# Patient Record
Sex: Female | Born: 1935 | Race: White | Hispanic: No | Smoking: Former smoker
Health system: Southern US, Community
[De-identification: ages and names within clinical notes are randomized; demographics above are authoritative.]

## PROBLEM LIST (undated history)

## (undated) DIAGNOSIS — Z950 Presence of cardiac pacemaker: Secondary | ICD-10-CM

## (undated) DIAGNOSIS — I495 Sick sinus syndrome: Secondary | ICD-10-CM

## (undated) DIAGNOSIS — F329 Major depressive disorder, single episode, unspecified: Secondary | ICD-10-CM

## (undated) DIAGNOSIS — M1990 Unspecified osteoarthritis, unspecified site: Secondary | ICD-10-CM

## (undated) DIAGNOSIS — F32A Depression, unspecified: Secondary | ICD-10-CM

## (undated) DIAGNOSIS — K449 Diaphragmatic hernia without obstruction or gangrene: Secondary | ICD-10-CM

## (undated) DIAGNOSIS — F419 Anxiety disorder, unspecified: Secondary | ICD-10-CM

## (undated) DIAGNOSIS — K219 Gastro-esophageal reflux disease without esophagitis: Secondary | ICD-10-CM

## (undated) DIAGNOSIS — K589 Irritable bowel syndrome without diarrhea: Secondary | ICD-10-CM

## (undated) DIAGNOSIS — Z8679 Personal history of other diseases of the circulatory system: Secondary | ICD-10-CM

## (undated) DIAGNOSIS — R413 Other amnesia: Secondary | ICD-10-CM

## (undated) DIAGNOSIS — M545 Low back pain, unspecified: Secondary | ICD-10-CM

## (undated) DIAGNOSIS — D472 Monoclonal gammopathy: Secondary | ICD-10-CM

## (undated) DIAGNOSIS — G309 Alzheimer's disease, unspecified: Secondary | ICD-10-CM

## (undated) DIAGNOSIS — E7800 Pure hypercholesterolemia, unspecified: Secondary | ICD-10-CM

## (undated) DIAGNOSIS — F0280 Dementia in other diseases classified elsewhere without behavioral disturbance: Secondary | ICD-10-CM

## (undated) DIAGNOSIS — D649 Anemia, unspecified: Secondary | ICD-10-CM

## (undated) DIAGNOSIS — K317 Polyp of stomach and duodenum: Secondary | ICD-10-CM

## (undated) DIAGNOSIS — K5730 Diverticulosis of large intestine without perforation or abscess without bleeding: Secondary | ICD-10-CM

## (undated) HISTORY — DX: Major depressive disorder, single episode, unspecified: F32.9

## (undated) HISTORY — DX: Low back pain: M54.5

## (undated) HISTORY — DX: Other amnesia: R41.3

## (undated) HISTORY — DX: Sick sinus syndrome: I49.5

## (undated) HISTORY — DX: Diaphragmatic hernia without obstruction or gangrene: K44.9

## (undated) HISTORY — DX: Anemia, unspecified: D64.9

## (undated) HISTORY — DX: Anxiety disorder, unspecified: F41.9

## (undated) HISTORY — DX: Pure hypercholesterolemia, unspecified: E78.00

## (undated) HISTORY — DX: Diverticulosis of large intestine without perforation or abscess without bleeding: K57.30

## (undated) HISTORY — DX: Alzheimer's disease, unspecified: G30.9

## (undated) HISTORY — DX: Monoclonal gammopathy: D47.2

## (undated) HISTORY — DX: Depression, unspecified: F32.A

## (undated) HISTORY — DX: Dementia in other diseases classified elsewhere without behavioral disturbance: F02.80

## (undated) HISTORY — DX: Gastro-esophageal reflux disease without esophagitis: K21.9

## (undated) HISTORY — DX: Presence of cardiac pacemaker: Z95.0

## (undated) HISTORY — DX: Irritable bowel syndrome, unspecified: K58.9

## (undated) HISTORY — DX: Unspecified osteoarthritis, unspecified site: M19.90

## (undated) HISTORY — DX: Low back pain, unspecified: M54.50

## (undated) HISTORY — DX: Polyp of stomach and duodenum: K31.7

## (undated) HISTORY — PX: PACEMAKER PLACEMENT: SHX43

## (undated) HISTORY — DX: Personal history of other diseases of the circulatory system: Z86.79

## (undated) HISTORY — PX: APPENDECTOMY: SHX54

---

## 1997-09-06 ENCOUNTER — Other Ambulatory Visit: Admission: RE | Admit: 1997-09-06 | Discharge: 1997-09-06 | Payer: Self-pay | Admitting: *Deleted

## 1998-10-08 ENCOUNTER — Other Ambulatory Visit: Admission: RE | Admit: 1998-10-08 | Discharge: 1998-10-08 | Payer: Self-pay | Admitting: *Deleted

## 1998-11-26 ENCOUNTER — Encounter (INDEPENDENT_AMBULATORY_CARE_PROVIDER_SITE_OTHER): Payer: Self-pay

## 1998-11-26 ENCOUNTER — Other Ambulatory Visit: Admission: RE | Admit: 1998-11-26 | Discharge: 1998-11-26 | Payer: Self-pay | Admitting: *Deleted

## 1999-02-19 ENCOUNTER — Encounter: Admission: RE | Admit: 1999-02-19 | Discharge: 1999-02-19 | Payer: Self-pay | Admitting: *Deleted

## 1999-12-03 ENCOUNTER — Other Ambulatory Visit: Admission: RE | Admit: 1999-12-03 | Discharge: 1999-12-03 | Payer: Self-pay | Admitting: *Deleted

## 2000-01-06 ENCOUNTER — Inpatient Hospital Stay (HOSPITAL_COMMUNITY): Admission: EM | Admit: 2000-01-06 | Discharge: 2000-01-07 | Payer: Self-pay | Admitting: Emergency Medicine

## 2000-01-06 ENCOUNTER — Encounter: Payer: Self-pay | Admitting: Cardiology

## 2000-03-15 ENCOUNTER — Encounter: Admission: RE | Admit: 2000-03-15 | Discharge: 2000-03-15 | Payer: Self-pay | Admitting: *Deleted

## 2000-03-15 ENCOUNTER — Encounter: Payer: Self-pay | Admitting: *Deleted

## 2001-03-30 ENCOUNTER — Encounter: Admission: RE | Admit: 2001-03-30 | Discharge: 2001-03-30 | Payer: Self-pay | Admitting: *Deleted

## 2001-03-30 ENCOUNTER — Encounter: Payer: Self-pay | Admitting: *Deleted

## 2001-04-04 ENCOUNTER — Encounter: Admission: RE | Admit: 2001-04-04 | Discharge: 2001-04-04 | Payer: Self-pay | Admitting: *Deleted

## 2001-04-04 ENCOUNTER — Encounter: Payer: Self-pay | Admitting: *Deleted

## 2001-05-02 ENCOUNTER — Encounter: Payer: Self-pay | Admitting: Internal Medicine

## 2002-06-02 ENCOUNTER — Encounter: Payer: Self-pay | Admitting: *Deleted

## 2002-06-02 ENCOUNTER — Encounter: Admission: RE | Admit: 2002-06-02 | Discharge: 2002-06-02 | Payer: Self-pay | Admitting: *Deleted

## 2003-06-26 ENCOUNTER — Ambulatory Visit (HOSPITAL_COMMUNITY): Admission: RE | Admit: 2003-06-26 | Discharge: 2003-06-26 | Payer: Self-pay | Admitting: Neurology

## 2003-06-29 ENCOUNTER — Encounter: Admission: RE | Admit: 2003-06-29 | Discharge: 2003-06-29 | Payer: Self-pay | Admitting: *Deleted

## 2003-07-06 ENCOUNTER — Encounter: Admission: RE | Admit: 2003-07-06 | Discharge: 2003-07-06 | Payer: Self-pay | Admitting: Neurology

## 2004-04-09 ENCOUNTER — Ambulatory Visit: Payer: Self-pay

## 2004-05-01 ENCOUNTER — Ambulatory Visit: Payer: Self-pay

## 2004-05-31 ENCOUNTER — Ambulatory Visit: Payer: Self-pay | Admitting: Cardiology

## 2004-06-26 ENCOUNTER — Ambulatory Visit: Payer: Self-pay | Admitting: Cardiology

## 2004-07-25 ENCOUNTER — Ambulatory Visit: Payer: Self-pay | Admitting: Cardiology

## 2004-08-25 ENCOUNTER — Ambulatory Visit: Payer: Self-pay | Admitting: Cardiology

## 2004-10-02 ENCOUNTER — Ambulatory Visit: Payer: Self-pay | Admitting: Cardiology

## 2004-10-02 ENCOUNTER — Other Ambulatory Visit: Admission: RE | Admit: 2004-10-02 | Discharge: 2004-10-02 | Payer: Self-pay | Admitting: *Deleted

## 2004-10-22 ENCOUNTER — Encounter: Admission: RE | Admit: 2004-10-22 | Discharge: 2004-10-22 | Payer: Self-pay | Admitting: *Deleted

## 2004-11-13 ENCOUNTER — Ambulatory Visit: Payer: Self-pay | Admitting: Cardiology

## 2004-12-15 ENCOUNTER — Ambulatory Visit: Payer: Self-pay | Admitting: Cardiology

## 2005-01-20 ENCOUNTER — Ambulatory Visit: Payer: Self-pay | Admitting: Cardiology

## 2005-01-21 ENCOUNTER — Ambulatory Visit: Payer: Self-pay | Admitting: Cardiology

## 2005-02-12 IMAGING — CT CT T SPINE W/ CM
3 of 15 series · 10 of 33 positions shown, 11 images · IV contrast (omnipaque)
Comparison: none

CLINICAL DATA: Intermittent bilateral lower extremity and right upper extremity pain and numbness.  No previous spinal surgery. 
 CERVICAL MYELOGRAM
 THORACIC MYELOGRAM
 LUMBAR MYELOGRAM
 CT CERVICAL SPINE WITH INTRATHECAL CONTRAST
 CT THORACIC SPINE WITH INTRATHECAL CONTRAST
 CT LUMBAR SPINE WITH INTRATHECAL CONTRAST
 CT MULTIPLANAR RECONSTRUCTIONS
TECHNIQUE: Lumbar region prepped with Betadine, draped in the usual sterile fashion, and infiltrated locally with buffered Lidocaine. Curved 22 gauge spinal needle advanced into the thecal sac at L3-4 from a left interlaminar approach. Once clear, colorless CSF returned, 10 ml Omnipaque 300 were administered intrathecally for cervical, thoracic, and lumbar myelography, followed by axial CT scanning of the cervical, thoracic, and lumbar spine.  No immediate complication. Sagittal and coronal reconstructions were generated from the axial CT scans.

[Series 8: recon 3: t spine · axial · 0.33mm/px · z∈[-299,+3]mm · 2 of 243 slices shown, 3 images]
[im 1/243  soft-tissue]
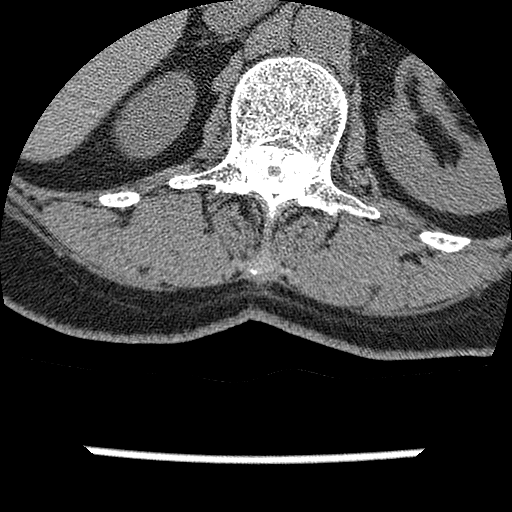
[im 1/243  bone]
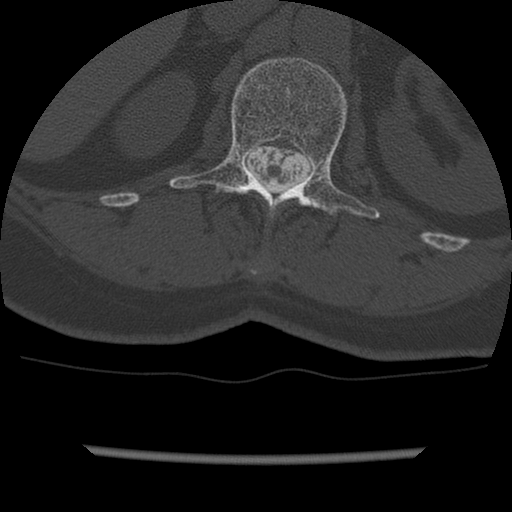
[im 243/243  bone]
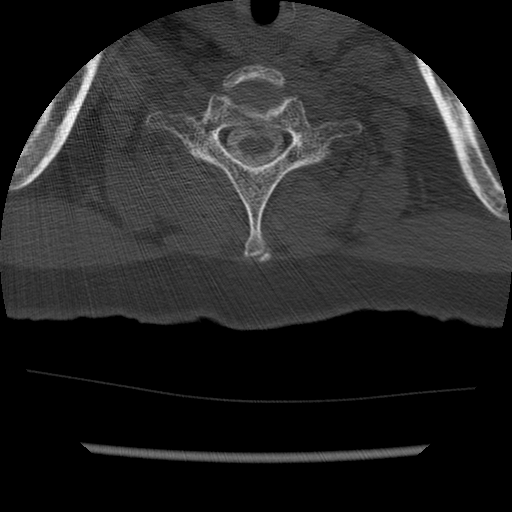

[Series 963: reformatted · sagittal · 0.27mm/px · 5 of 40 slices shown (1 of 2)]
[im 7/40  bone]
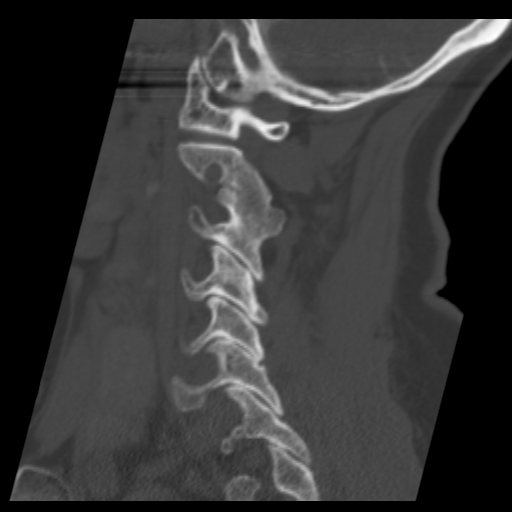
[im 14/40  bone]
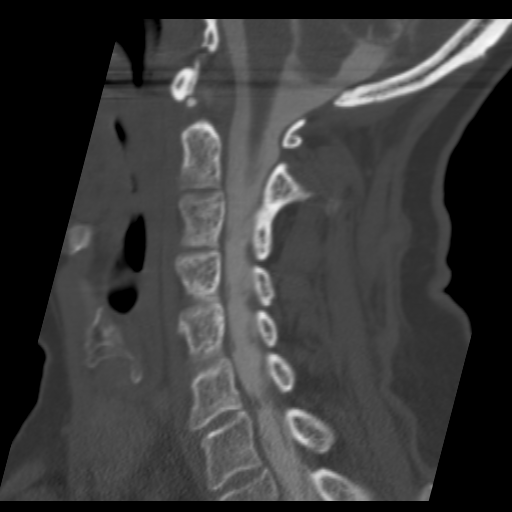
[im 20/40  bone]
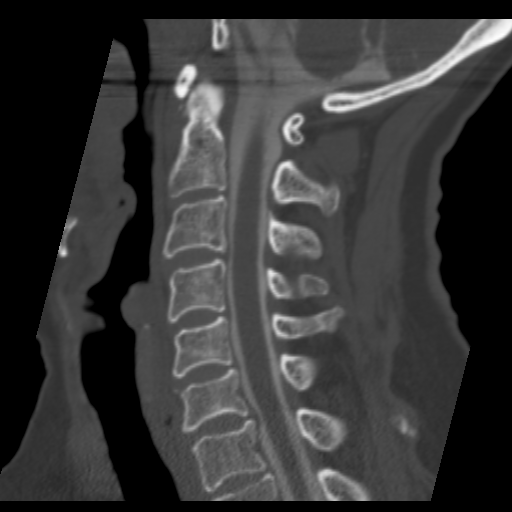
[im 27/40  bone]
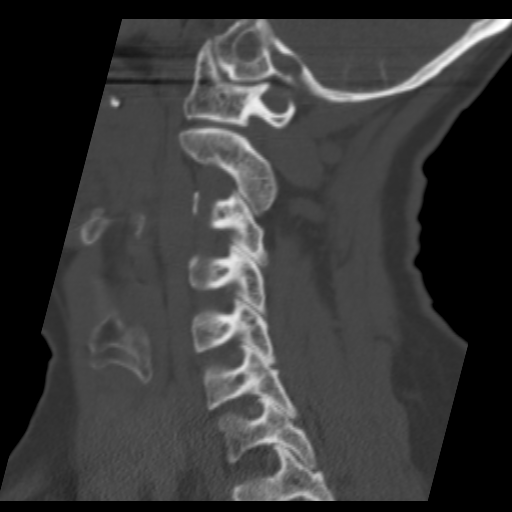
[im 33/40  bone]
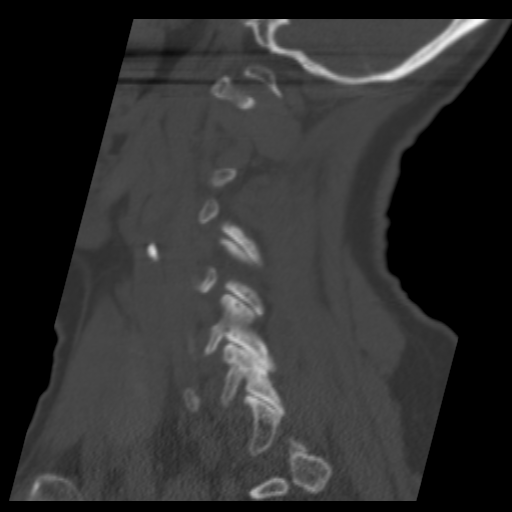

[Series 964: reformatted · sagittal · 0.61mm/px · 3 of 34 slices shown (2 of 2)]
[im 7/34  bone]
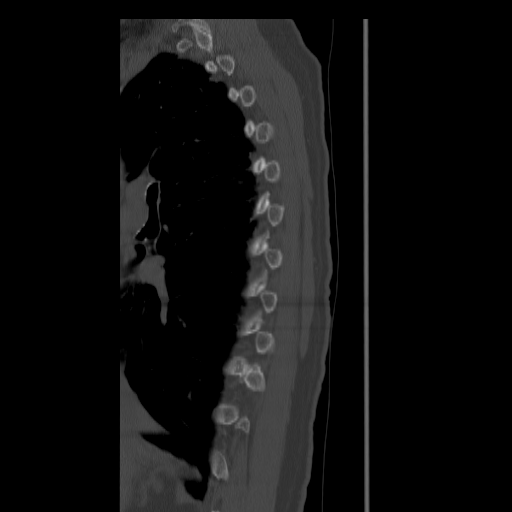
[im 14/34  bone]
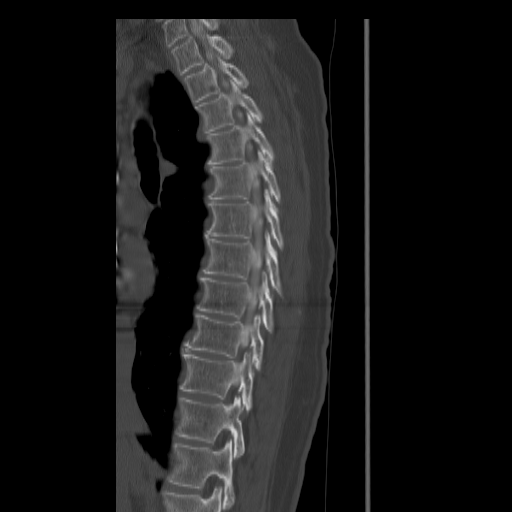
[im 20/34  bone]
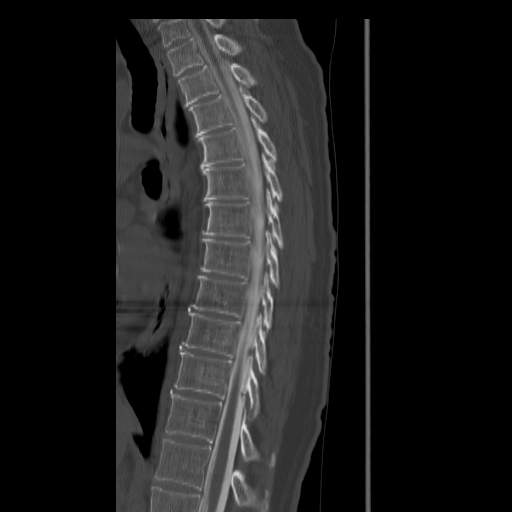

[10 of 33 positions shown; findings below may reference images not displayed]

FINDINGS: CERVICAL SPINE:
 C2-3:  Unremarkable.
 C3-4:  Small central bulge with no cord distortion.  Neural foramina remain widely patent.  
 C4-5:  Unremarkable. 
 C5-6:  Mild bilateral facet degenerative hypertrophy.  No disk pathology.  Neural foramina and central canal widely patent.
 C6-7:  Small central protrusion which approaches the anterior aspect of the cervical cord without any significant cord distortion.  There is only mild narrowing of the spinal canal at this level.  Neural foramina remain widely patent.  
 C7-T1:  Unremarkable.
 No large venous or vascular channels are noted to suggest an underlying vascular lesion.  
 IMPRESSION
 Small central bulge C3-4 and a central protrusion C6-7 without significant cord distortion or other compressive pathology.
 THORACIC SPINE:
 T1-T8:  Small anterior endplate spurs. No posterior bulge, protrusion, or herniation.  Thoracic cord is unremarkable in appearance.  Neural foramina are widely patent.
 T8-9:  There is a small left paracentral disk bulge with minimal flattening of the anterior aspect of the thoracic cord.  No significant stenosis.  Neural foramina are widely patent. 
 T9-L1:  Unremarkable.
 IMPRESSION
 Tiny left paracentral bulge at T8-9, of questionable clinical significance.
 LUMBAR SPINE:
 L1-2:  Normal conus behind the interspace.  No spinal stenosis or neural foraminal stenosis.  
 L2-3:  There is marked narrowing of the interspace.  Vacuum phenomenon is evident in the interspace.  There is discogenic sclerosis in the inferior aspect of the L2 vertebral body and the superior aspect of the L3 vertebral body.  There is circumferential disk bulge with associated endplate spurring.  Mild bilateral facet degenerative hypertrophy and some thickening of the ligamentum flavum  contributes to mild spinal stenosis at this level.  There is bilateral subarticular recess and lateral recess encroachment.  
 L3-4:  Circumferential disk bulge.  Mild bilateral facet degenerative hypertrophy, right greater than left and some thickening of the ligamentum flavum contributing to subarticular recess narrowing, left greater than right and very mild spinal stenosis.  
 L4-5:  Mild asymmetric disk bulge, left greater than right.  Bilateral facet degenerative hypertrophy and thickening of ligamentum flavum contributing to mild spinal stenosis and early lateral recess encroachment.  There is Grade I anterolisthesis without evidence of dynamic instability on standing lateral flexion and extension radiographs.  
 L5-S1:  Mild diffuse disk bulge which minimally indents the anterior aspect of the thecal sac without spinal stenosis.  Mild bilateral facet degenerative hypertrophy.  
 IMPRESSION
 1.  Advanced degenerative disk disease L2-3 with mild multifactorial spinal stenosis and subarticular recess narrowing.
 2.  Mild spinal stenosis L3-4 and L4-5.
 3.  Mild disk bulge L5-S1 without compressive pathology.
 CT MULTIPLANAR RECONSTRUCTIONS
 The sagittal and coronal reconstructions confirm the previously dictated findings and demonstrate normal alignment. 
 IMPRESSION
 See complete CT report above.

## 2005-02-17 ENCOUNTER — Ambulatory Visit: Payer: Self-pay | Admitting: Cardiology

## 2005-02-24 ENCOUNTER — Ambulatory Visit: Payer: Self-pay | Admitting: Pulmonary Disease

## 2005-03-19 ENCOUNTER — Ambulatory Visit: Payer: Self-pay | Admitting: Cardiology

## 2005-04-20 ENCOUNTER — Ambulatory Visit: Payer: Self-pay | Admitting: Cardiology

## 2005-05-26 ENCOUNTER — Ambulatory Visit: Payer: Self-pay | Admitting: Internal Medicine

## 2005-05-27 ENCOUNTER — Ambulatory Visit: Payer: Self-pay | Admitting: Cardiology

## 2005-07-01 ENCOUNTER — Ambulatory Visit: Payer: Self-pay | Admitting: Cardiology

## 2005-07-29 ENCOUNTER — Ambulatory Visit: Payer: Self-pay | Admitting: Cardiology

## 2005-09-04 ENCOUNTER — Ambulatory Visit: Payer: Self-pay | Admitting: Cardiology

## 2005-10-16 ENCOUNTER — Ambulatory Visit: Payer: Self-pay | Admitting: Cardiology

## 2005-11-13 ENCOUNTER — Ambulatory Visit: Payer: Self-pay | Admitting: Cardiology

## 2005-11-17 ENCOUNTER — Encounter: Admission: RE | Admit: 2005-11-17 | Discharge: 2005-11-17 | Payer: Self-pay | Admitting: *Deleted

## 2005-11-26 ENCOUNTER — Encounter: Admission: RE | Admit: 2005-11-26 | Discharge: 2005-11-26 | Payer: Self-pay | Admitting: *Deleted

## 2005-12-28 ENCOUNTER — Ambulatory Visit: Payer: Self-pay | Admitting: Pulmonary Disease

## 2006-01-20 ENCOUNTER — Ambulatory Visit: Payer: Self-pay | Admitting: Cardiology

## 2006-02-24 ENCOUNTER — Ambulatory Visit: Payer: Self-pay | Admitting: Cardiology

## 2006-04-26 ENCOUNTER — Ambulatory Visit: Payer: Self-pay | Admitting: Cardiology

## 2006-05-25 ENCOUNTER — Ambulatory Visit: Payer: Self-pay | Admitting: Internal Medicine

## 2006-06-02 ENCOUNTER — Ambulatory Visit: Payer: Self-pay | Admitting: Internal Medicine

## 2006-06-16 ENCOUNTER — Ambulatory Visit: Payer: Self-pay | Admitting: Cardiology

## 2006-08-11 ENCOUNTER — Ambulatory Visit: Payer: Self-pay | Admitting: Cardiology

## 2006-10-06 ENCOUNTER — Ambulatory Visit: Payer: Self-pay | Admitting: Cardiology

## 2006-12-01 ENCOUNTER — Ambulatory Visit: Payer: Self-pay | Admitting: Cardiology

## 2006-12-02 ENCOUNTER — Ambulatory Visit: Payer: Self-pay | Admitting: Pulmonary Disease

## 2006-12-02 LAB — CONVERTED CEMR LAB
ALT: 17 units/L (ref 0–35)
AST: 20 units/L (ref 0–37)
Albumin: 3.2 g/dL — ABNORMAL LOW (ref 3.5–5.2)
Alkaline Phosphatase: 101 units/L (ref 39–117)
BUN: 17 mg/dL (ref 6–23)
Basophils Absolute: 0.2 10*3/uL — ABNORMAL HIGH (ref 0.0–0.1)
Calcium: 9.5 mg/dL (ref 8.4–10.5)
Chloride: 106 meq/L (ref 96–112)
Eosinophils Relative: 5.3 % — ABNORMAL HIGH (ref 0.0–5.0)
GFR calc Af Amer: 90 mL/min
GFR calc non Af Amer: 74 mL/min
Glucose, Bld: 112 mg/dL — ABNORMAL HIGH (ref 70–99)
HCT: 37.8 % (ref 36.0–46.0)
Ketones, ur: NEGATIVE mg/dL
LDL Cholesterol: 127 mg/dL — ABNORMAL HIGH (ref 0–99)
Neutrophils Relative %: 60.5 % (ref 43.0–77.0)
Platelets: 241 10*3/uL (ref 150–400)
RBC: 4.23 M/uL (ref 3.87–5.11)
RDW: 12.9 % (ref 11.5–14.6)
Specific Gravity, Urine: >=1.03 (ref 1.000–1.03)
Total CHOL/HDL Ratio: 3.7
Total Protein, Urine: NEGATIVE mg/dL
WBC: 9.5 10*3/uL (ref 4.5–10.5)
pH: 5.5 (ref 5.0–8.0)

## 2006-12-03 ENCOUNTER — Encounter: Admission: RE | Admit: 2006-12-03 | Discharge: 2006-12-03 | Payer: Self-pay | Admitting: Pulmonary Disease

## 2007-01-19 ENCOUNTER — Ambulatory Visit: Payer: Self-pay | Admitting: Cardiology

## 2007-03-23 ENCOUNTER — Ambulatory Visit: Payer: Self-pay | Admitting: Cardiology

## 2007-04-20 ENCOUNTER — Telehealth: Payer: Self-pay | Admitting: Pulmonary Disease

## 2007-04-26 ENCOUNTER — Encounter: Payer: Self-pay | Admitting: Pulmonary Disease

## 2007-04-26 DIAGNOSIS — J309 Allergic rhinitis, unspecified: Secondary | ICD-10-CM | POA: Insufficient documentation

## 2007-04-26 DIAGNOSIS — E7800 Pure hypercholesterolemia, unspecified: Secondary | ICD-10-CM

## 2007-04-26 DIAGNOSIS — K589 Irritable bowel syndrome without diarrhea: Secondary | ICD-10-CM

## 2007-04-26 DIAGNOSIS — I495 Sick sinus syndrome: Secondary | ICD-10-CM

## 2007-05-18 ENCOUNTER — Ambulatory Visit: Payer: Self-pay | Admitting: Cardiology

## 2007-08-17 ENCOUNTER — Ambulatory Visit: Payer: Self-pay | Admitting: Cardiology

## 2007-08-25 ENCOUNTER — Telehealth: Payer: Self-pay | Admitting: Pulmonary Disease

## 2007-10-13 ENCOUNTER — Other Ambulatory Visit: Admission: RE | Admit: 2007-10-13 | Discharge: 2007-10-13 | Payer: Self-pay | Admitting: Gynecology

## 2007-11-04 ENCOUNTER — Ambulatory Visit: Payer: Self-pay | Admitting: Pulmonary Disease

## 2007-11-04 DIAGNOSIS — K5730 Diverticulosis of large intestine without perforation or abscess without bleeding: Secondary | ICD-10-CM | POA: Insufficient documentation

## 2007-11-04 DIAGNOSIS — M545 Low back pain: Secondary | ICD-10-CM

## 2007-11-04 DIAGNOSIS — K219 Gastro-esophageal reflux disease without esophagitis: Secondary | ICD-10-CM

## 2007-11-04 DIAGNOSIS — I319 Disease of pericardium, unspecified: Secondary | ICD-10-CM | POA: Insufficient documentation

## 2007-11-04 DIAGNOSIS — H9190 Unspecified hearing loss, unspecified ear: Secondary | ICD-10-CM | POA: Insufficient documentation

## 2007-11-07 LAB — CONVERTED CEMR LAB
ALT: 15 units/L (ref 0–35)
AST: 26 units/L (ref 0–37)
Alkaline Phosphatase: 85 units/L (ref 39–117)
BUN: 19 mg/dL (ref 6–23)
Basophils Relative: 3.3 % — ABNORMAL HIGH (ref 0.0–1.0)
CO2: 29 meq/L (ref 19–32)
Chloride: 103 meq/L (ref 96–112)
Creatinine, Ser: 0.9 mg/dL (ref 0.4–1.2)
Eosinophils Absolute: 0.6 10*3/uL (ref 0.0–0.7)
Eosinophils Relative: 8.9 % — ABNORMAL HIGH (ref 0.0–5.0)
GFR calc non Af Amer: 65 mL/min
HDL: 56.9 mg/dL (ref >39.0)
Lymphocytes Relative: 35.8 % (ref 12.0–46.0)
MCV: 91.5 fL (ref 78.0–100.0)
Neutrophils Relative %: 47.3 % (ref 43.0–77.0)
Platelets: 250 10*3/uL (ref 150–400)
Potassium: 4 meq/L (ref 3.5–5.1)
RBC: 4.27 M/uL (ref 3.87–5.11)
Total Bilirubin: 0.8 mg/dL (ref 0.3–1.2)
Total CHOL/HDL Ratio: 4.2
Triglycerides: 106 mg/dL (ref 0–149)
VLDL: 21 mg/dL (ref 0–40)
WBC: 7.1 10*3/uL (ref 4.5–10.5)

## 2007-11-16 ENCOUNTER — Ambulatory Visit: Payer: Self-pay | Admitting: Cardiology

## 2007-12-16 ENCOUNTER — Encounter: Admission: RE | Admit: 2007-12-16 | Discharge: 2007-12-16 | Payer: Self-pay | Admitting: Gynecology

## 2008-01-10 ENCOUNTER — Ambulatory Visit: Payer: Self-pay | Admitting: Cardiology

## 2008-02-02 ENCOUNTER — Ambulatory Visit: Payer: Self-pay | Admitting: Cardiology

## 2008-02-02 LAB — CONVERTED CEMR LAB
Albumin: 3.6 g/dL (ref 3.5–5.2)
Cholesterol: 147 mg/dL (ref 0–200)
LDL Cholesterol: 78 mg/dL (ref 0–99)
Total CHOL/HDL Ratio: 2.7
Total Protein: 7 g/dL (ref 6.0–8.3)
Triglycerides: 73 mg/dL (ref 0–149)
VLDL: 15 mg/dL (ref 0–40)

## 2008-02-15 ENCOUNTER — Ambulatory Visit: Payer: Self-pay | Admitting: Cardiology

## 2008-02-21 ENCOUNTER — Ambulatory Visit: Payer: Self-pay | Admitting: Cardiology

## 2008-03-01 ENCOUNTER — Encounter: Payer: Self-pay | Admitting: Pulmonary Disease

## 2008-03-09 ENCOUNTER — Ambulatory Visit: Payer: Self-pay | Admitting: Cardiology

## 2008-03-09 ENCOUNTER — Ambulatory Visit (HOSPITAL_COMMUNITY): Admission: RE | Admit: 2008-03-09 | Discharge: 2008-03-09 | Payer: Self-pay | Admitting: Cardiology

## 2008-03-21 ENCOUNTER — Ambulatory Visit: Payer: Self-pay

## 2008-04-02 ENCOUNTER — Ambulatory Visit: Payer: Self-pay | Admitting: Cardiology

## 2008-04-13 ENCOUNTER — Ambulatory Visit: Payer: Self-pay | Admitting: Pulmonary Disease

## 2008-04-24 ENCOUNTER — Telehealth: Payer: Self-pay | Admitting: Adult Health

## 2008-05-08 ENCOUNTER — Ambulatory Visit: Payer: Self-pay | Admitting: Cardiovascular Disease

## 2008-05-14 ENCOUNTER — Ambulatory Visit: Payer: Self-pay

## 2008-05-14 ENCOUNTER — Encounter: Payer: Self-pay | Admitting: Pulmonary Disease

## 2008-05-23 ENCOUNTER — Ambulatory Visit: Payer: Self-pay | Admitting: Cardiology

## 2008-06-12 ENCOUNTER — Telehealth: Payer: Self-pay | Admitting: Internal Medicine

## 2008-06-12 DIAGNOSIS — R197 Diarrhea, unspecified: Secondary | ICD-10-CM | POA: Insufficient documentation

## 2008-06-12 DIAGNOSIS — R109 Unspecified abdominal pain: Secondary | ICD-10-CM | POA: Insufficient documentation

## 2008-06-13 ENCOUNTER — Ambulatory Visit: Payer: Self-pay | Admitting: Internal Medicine

## 2008-06-19 ENCOUNTER — Telehealth: Payer: Self-pay | Admitting: Internal Medicine

## 2008-06-19 ENCOUNTER — Ambulatory Visit: Payer: Self-pay | Admitting: Internal Medicine

## 2008-06-23 ENCOUNTER — Encounter: Payer: Self-pay | Admitting: Internal Medicine

## 2008-06-26 ENCOUNTER — Telehealth: Payer: Self-pay | Admitting: Internal Medicine

## 2008-08-16 ENCOUNTER — Encounter (INDEPENDENT_AMBULATORY_CARE_PROVIDER_SITE_OTHER): Payer: Self-pay | Admitting: *Deleted

## 2008-12-18 DIAGNOSIS — Z950 Presence of cardiac pacemaker: Secondary | ICD-10-CM

## 2009-01-01 ENCOUNTER — Encounter: Admission: RE | Admit: 2009-01-01 | Discharge: 2009-01-01 | Payer: Self-pay | Admitting: Pulmonary Disease

## 2009-01-24 ENCOUNTER — Encounter: Payer: Self-pay | Admitting: Pulmonary Disease

## 2009-02-20 ENCOUNTER — Ambulatory Visit: Payer: Self-pay | Admitting: Cardiology

## 2009-02-26 LAB — CONVERTED CEMR LAB
Albumin: 3.5 g/dL (ref 3.5–5.2)
Alkaline Phosphatase: 89 units/L (ref 39–117)
Basophils Relative: 6.4 % — ABNORMAL HIGH (ref 0.0–3.0)
Bilirubin, Direct: 0.1 mg/dL (ref 0.0–0.3)
CO2: 27 meq/L (ref 19–32)
Chloride: 106 meq/L (ref 96–112)
Eosinophils Relative: 7.5 % — ABNORMAL HIGH (ref 0.0–5.0)
HCT: 34.5 % — ABNORMAL LOW (ref 36.0–46.0)
HDL: 57.6 mg/dL (ref >39.00)
Hemoglobin: 11.5 g/dL — ABNORMAL LOW (ref 12.0–15.0)
LDL Cholesterol: 95 mg/dL (ref 0–99)
MCHC: 33.4 g/dL (ref 30.0–36.0)
MCV: 93.7 fL (ref 78.0–100.0)
Neutrophils Relative %: 51.6 % (ref 43.0–77.0)
Potassium: 4.6 meq/L (ref 3.5–5.1)
RBC: 3.69 M/uL — ABNORMAL LOW (ref 3.87–5.11)
Sodium: 146 meq/L — ABNORMAL HIGH (ref 135–145)
Total Bilirubin: 0.7 mg/dL (ref 0.3–1.2)
VLDL: 19 mg/dL (ref 0.0–40.0)
WBC: 6.9 10*3/uL (ref 4.5–10.5)

## 2009-03-12 ENCOUNTER — Telehealth (INDEPENDENT_AMBULATORY_CARE_PROVIDER_SITE_OTHER): Payer: Self-pay | Admitting: *Deleted

## 2009-03-29 ENCOUNTER — Emergency Department (HOSPITAL_COMMUNITY): Admission: EM | Admit: 2009-03-29 | Discharge: 2009-03-29 | Payer: Self-pay | Admitting: Emergency Medicine

## 2009-03-29 ENCOUNTER — Telehealth: Payer: Self-pay | Admitting: Internal Medicine

## 2009-03-29 ENCOUNTER — Telehealth: Payer: Self-pay | Admitting: Cardiology

## 2009-04-12 ENCOUNTER — Ambulatory Visit: Payer: Self-pay | Admitting: Pulmonary Disease

## 2009-04-12 DIAGNOSIS — D649 Anemia, unspecified: Secondary | ICD-10-CM | POA: Insufficient documentation

## 2009-04-13 DIAGNOSIS — M1990 Unspecified osteoarthritis, unspecified site: Secondary | ICD-10-CM | POA: Insufficient documentation

## 2009-04-13 LAB — CONVERTED CEMR LAB
Eosinophils Absolute: 0.2 10*3/uL (ref 0.0–0.7)
Eosinophils Relative: 3.8 % (ref 0.0–5.0)
Folate: 9.1 ng/mL
Lymphocytes Relative: 30.4 % (ref 12.0–46.0)
MCV: 93.7 fL (ref 78.0–100.0)
Monocytes Absolute: 0.4 10*3/uL (ref 0.1–1.0)
Neutrophils Relative %: 58.7 % (ref 43.0–77.0)
Platelets: 217 10*3/uL (ref 150.0–400.0)
Saturation Ratios: 24.7 % (ref 20.0–50.0)
Vitamin B-12: 235 pg/mL (ref 211–911)
WBC: 6.4 10*3/uL (ref 4.5–10.5)

## 2009-04-23 ENCOUNTER — Telehealth (INDEPENDENT_AMBULATORY_CARE_PROVIDER_SITE_OTHER): Payer: Self-pay | Admitting: *Deleted

## 2009-05-07 LAB — CONVERTED CEMR LAB
Albumin ELP: 57 % (ref 55.8–66.1)
Alpha-2-Globulin: 12.4 % — ABNORMAL HIGH (ref 7.1–11.8)
Beta Globulin: 7 % (ref 4.7–7.2)
Total Protein, Serum Electrophoresis: 7.1 g/dL (ref 6.0–8.3)

## 2009-05-11 ENCOUNTER — Emergency Department (HOSPITAL_COMMUNITY): Admission: EM | Admit: 2009-05-11 | Discharge: 2009-05-11 | Payer: Self-pay | Admitting: Family Medicine

## 2009-05-22 ENCOUNTER — Ambulatory Visit: Payer: Self-pay | Admitting: Cardiology

## 2009-06-10 ENCOUNTER — Inpatient Hospital Stay (HOSPITAL_COMMUNITY): Admission: EM | Admit: 2009-06-10 | Discharge: 2009-06-13 | Payer: Self-pay | Admitting: Emergency Medicine

## 2009-06-17 ENCOUNTER — Telehealth: Payer: Self-pay | Admitting: Pulmonary Disease

## 2009-06-18 ENCOUNTER — Ambulatory Visit: Payer: Self-pay | Admitting: Psychology

## 2009-06-26 ENCOUNTER — Ambulatory Visit: Payer: Self-pay | Admitting: Pulmonary Disease

## 2009-06-26 DIAGNOSIS — F341 Dysthymic disorder: Secondary | ICD-10-CM

## 2009-06-26 DIAGNOSIS — D472 Monoclonal gammopathy: Secondary | ICD-10-CM | POA: Insufficient documentation

## 2009-07-01 ENCOUNTER — Ambulatory Visit: Payer: Self-pay | Admitting: Psychology

## 2009-07-16 ENCOUNTER — Ambulatory Visit: Payer: Self-pay | Admitting: Psychology

## 2009-08-02 ENCOUNTER — Ambulatory Visit: Payer: Self-pay | Admitting: Psychology

## 2009-08-21 ENCOUNTER — Ambulatory Visit: Payer: Self-pay | Admitting: Cardiology

## 2009-09-24 ENCOUNTER — Ambulatory Visit: Payer: Self-pay | Admitting: Psychology

## 2009-10-15 ENCOUNTER — Ambulatory Visit: Payer: Self-pay | Admitting: Psychology

## 2009-10-17 IMAGING — CR DG CHEST 2V
2 series · 2 of 2 positions shown · non-contrast
Comparison: None available

CLINICAL DATA: Pacemaker generator change today.  Preprocedural
chest radiograph.

CHEST - 2 VIEW

[view not recorded (1 of 2)]
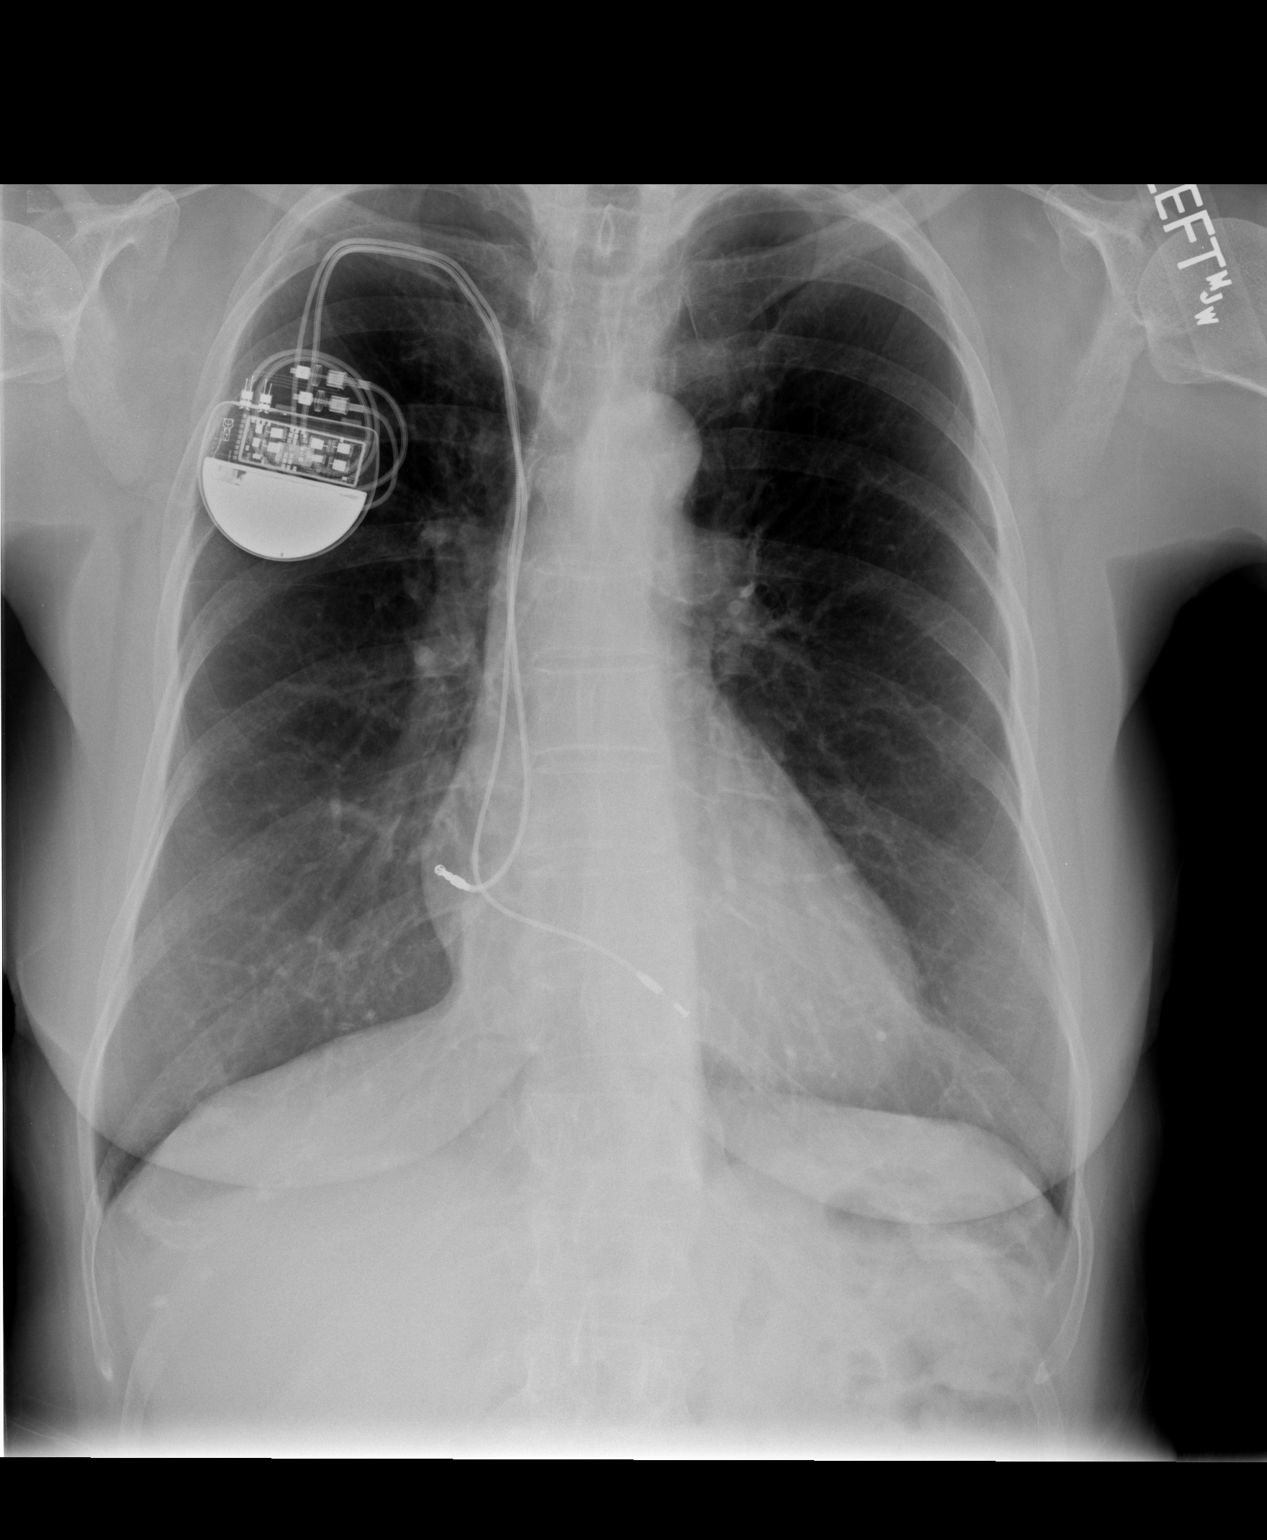

[view not recorded (2 of 2)]
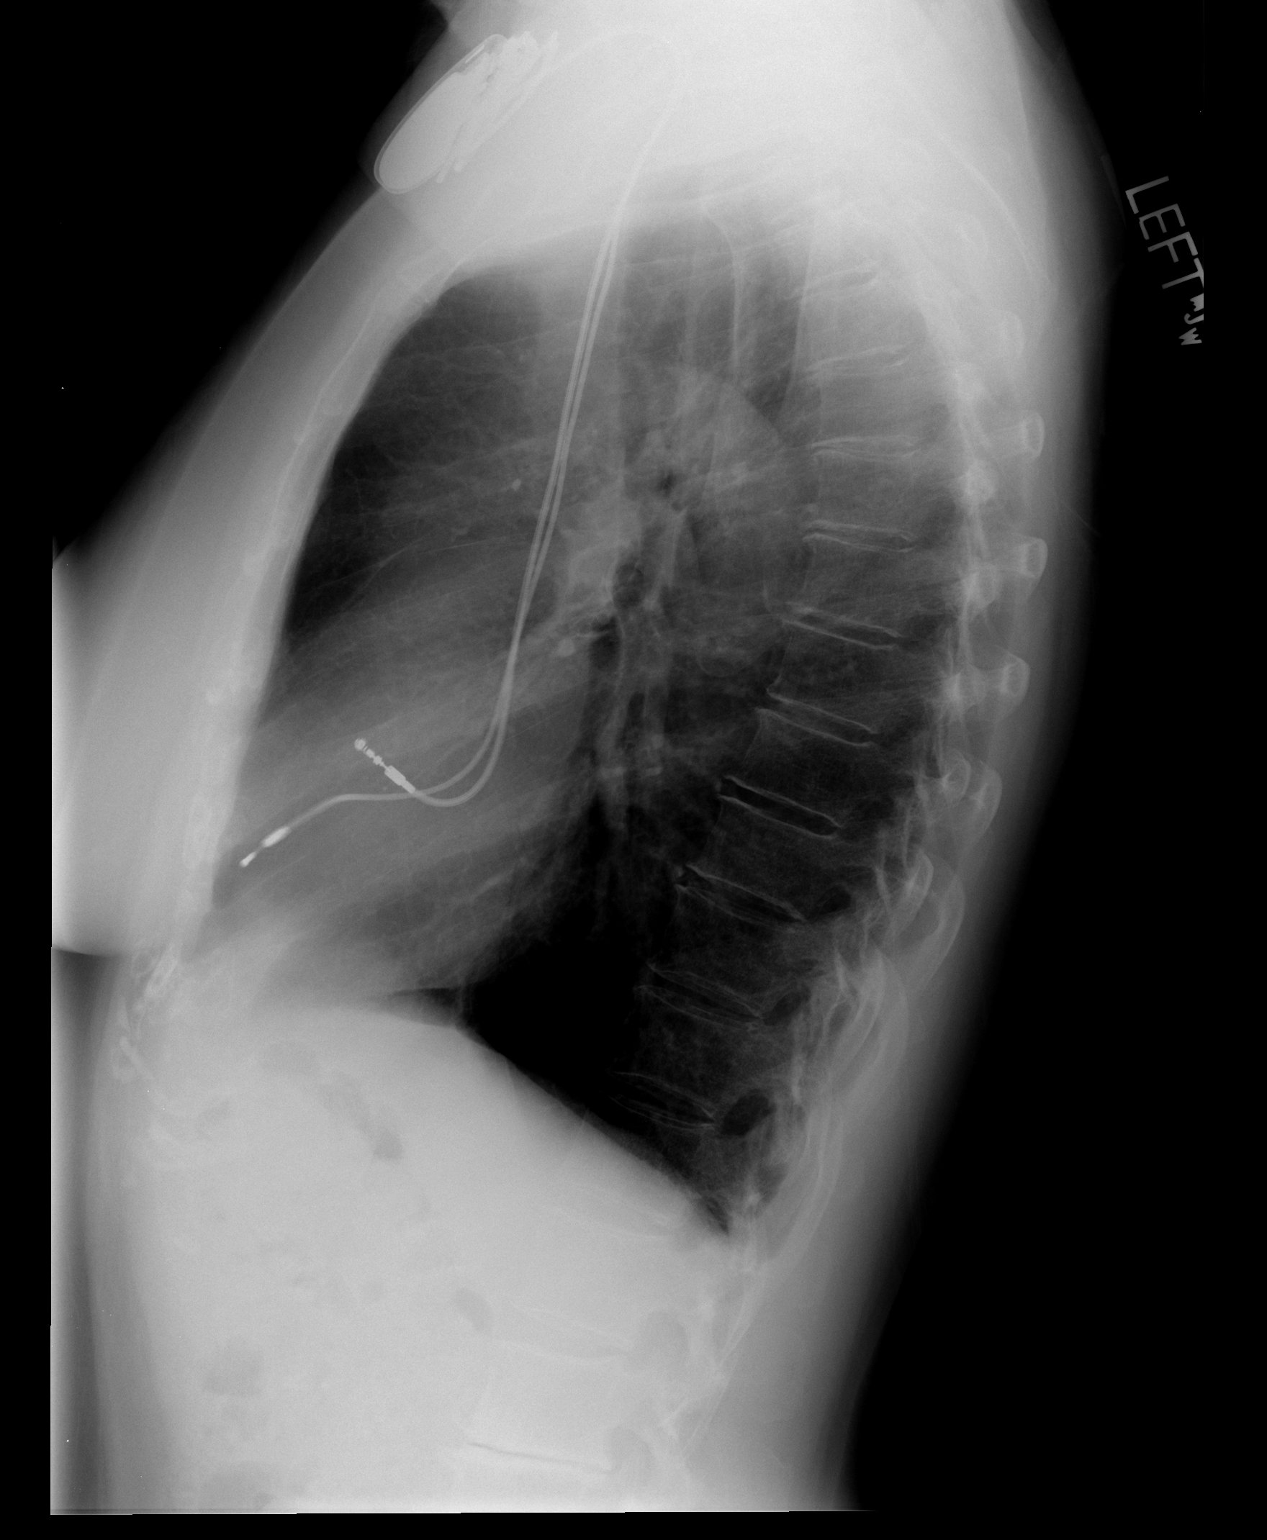

[2 of 2 positions shown; findings below may reference images not displayed]

FINDINGS: Emphysema.  Dual lead right subclavian pacemaker is
present.  No airspace disease or effusion.  Mild thoracic
spondylosis.  Minimal atelectasis or scarring over the right
hemidiaphragm. Cardiopericardial silhouette borderline in size.
IMPRESSION: Emphysema without acute cardiopulmonary disease.

## 2009-11-18 ENCOUNTER — Ambulatory Visit: Payer: Self-pay | Admitting: Psychology

## 2009-11-21 ENCOUNTER — Ambulatory Visit: Payer: Self-pay | Admitting: Cardiology

## 2009-12-03 ENCOUNTER — Ambulatory Visit: Payer: Self-pay | Admitting: Psychology

## 2009-12-23 ENCOUNTER — Ambulatory Visit: Payer: Self-pay | Admitting: Pulmonary Disease

## 2010-01-02 ENCOUNTER — Encounter: Admission: RE | Admit: 2010-01-02 | Discharge: 2010-01-02 | Payer: Self-pay | Admitting: Pulmonary Disease

## 2010-01-31 ENCOUNTER — Ambulatory Visit: Payer: Self-pay | Admitting: Pulmonary Disease

## 2010-02-11 ENCOUNTER — Ambulatory Visit: Payer: Self-pay | Admitting: Cardiology

## 2010-02-11 ENCOUNTER — Encounter: Payer: Self-pay | Admitting: Internal Medicine

## 2010-02-21 ENCOUNTER — Ambulatory Visit: Payer: Self-pay | Admitting: Cardiology

## 2010-03-24 ENCOUNTER — Ambulatory Visit (HOSPITAL_COMMUNITY): Admission: RE | Admit: 2010-03-24 | Discharge: 2010-03-24 | Payer: Self-pay | Admitting: Obstetrics and Gynecology

## 2010-03-24 HISTORY — PX: DILATION AND CURETTAGE OF UTERUS: SHX78

## 2010-04-21 ENCOUNTER — Ambulatory Visit: Payer: Self-pay | Admitting: Professional

## 2010-04-28 ENCOUNTER — Ambulatory Visit: Payer: Self-pay | Admitting: Professional

## 2010-05-15 ENCOUNTER — Ambulatory Visit
Admission: RE | Admit: 2010-05-15 | Discharge: 2010-05-15 | Payer: Self-pay | Source: Home / Self Care | Attending: Professional | Admitting: Professional

## 2010-05-27 ENCOUNTER — Ambulatory Visit
Admission: RE | Admit: 2010-05-27 | Discharge: 2010-05-27 | Payer: Self-pay | Source: Home / Self Care | Attending: Pulmonary Disease | Admitting: Pulmonary Disease

## 2010-05-29 ENCOUNTER — Ambulatory Visit
Admission: RE | Admit: 2010-05-29 | Discharge: 2010-05-29 | Payer: Self-pay | Source: Home / Self Care | Attending: Professional | Admitting: Professional

## 2010-05-31 ENCOUNTER — Encounter: Payer: Self-pay | Admitting: Neurology

## 2010-06-01 ENCOUNTER — Encounter: Payer: Self-pay | Admitting: *Deleted

## 2010-06-03 ENCOUNTER — Encounter: Payer: Self-pay | Admitting: Internal Medicine

## 2010-06-10 ENCOUNTER — Ambulatory Visit: Payer: Self-pay | Admitting: Internal Medicine

## 2010-06-10 NOTE — Progress Notes (Signed)
Summary: needs appt next wk w/ sn  Phone Note Call from Patient Call back at Home Phone [phone]   Caller: Patient Call For: Gryffin Altice Summary of Call: pt needs to f/u with dr Calhoun Reichardt. (recently d/c'd from hospital). pt needs this appt next week per pt.  Initial call taken by: Tivis Ringer, CNA,  June 17, 2009 9:32 AM  Follow-up for Phone Call        Please advise of appt date and time for HFU with SN.Michel Bickers [facility]  June 17, 2009 10:05 AM  2-16 at 3:30---if anything else opens up i will call pt.  thanks Randell Loop CMA  June 17, 2009 10:08 AM     Additional Follow-up for Phone Call Additional follow up Details #1::        Pt informed and appointment made. Zackery Barefoot CMA  June 17, 2009 10:15 AM

## 2010-06-10 NOTE — Cardiovascular Report (Signed)
Summary: TTM   TTM   Imported By: Roderic Ovens 02/06/2010 16:12:55  _____________________________________________________________________  External Attachment:    Type:   Image     Comment:   External Document

## 2010-06-10 NOTE — Cardiovascular Report (Signed)
Summary: Office Visit  Office Visit   Imported By: Marylou Mccoy 02/18/2010 11:52:54  _____________________________________________________________________  External Attachment:    Type:   Image     Comment:   External Document

## 2010-06-10 NOTE — Assessment & Plan Note (Signed)
Summary: Hospital follow up/ok per [place]-jwr   Primary Care Provider:  Alroy Dust, MD  CC:  Post hospital ROV....  History of Present Illness: 77 y/o WF here for a follow up visit... she has multiple medical problems as noted below...   ~  April 12, 2009:  she's had a good year- remains on Zoloft regularly + Ativan Prn... she saw DrBBrodie 10/10 for pacer check- generator changed 10/09 & doing well... she had atypic CP 1/10 w/ Myoview that was neg... she saw DrDBrodie 2/10 w/ diarrhea- hx IBS, Rx'd w/ probiotic & Flagyl... she is due for f/u colon & will call DrBrodie to set up... she refuses Flu vaccine (but actually received the 2010 vaccine 2/11 in hosp-see below).   ~  June 26, 2009:  she was hosp 1/30- 06/13/09 [facility] for severe gastroenteritis w/ N/ V/ Diarrhea, leukocytosis, mild dehydration, etc... CDiff neg, but Lactoferrin pos- treated empirically w/ Cipro/ Flagyl (plus Zofran & Florastor) & improved back to baseline...    Current Problem List:  HEARING LOSS (ICD-389.9) - she wears bilat hearing aides & has been tested by DrPahel...  ALLERGIC RHINITIS (ICD-477.9) - uses OTC antihistamines Prn...  SICK SINUS SYNDROME (ICD-427.81) - s/p pacemaker placed in 1996 for SSS... yearly pacer f/u by DrBrodie- generator changed 10/09 & last seen 10/10 doing well...  Hx of PERICARDITIS (ICD-423.9) - hosp in 2001 w/ CP & acute pericarditis... serial Echo's showed resolution of the effusion...  ~  2DEcho 5/04 showed a prob patent foramen ovale w/ left to right interatrial shunt by doppler... DrBBrodie is aware & following.  ~  NuclearStressTest 5/04 was negative- no ischemia, no infarct, EF= 65%...  ~  repeat Nuclear Stress Test 1/10 was neg- no scar or ischemia, EF=74%...  HYPERCHOLESTEROLEMIA (ICD-272.0) - on SIMVASTATIN 20mg /d & low fat diet...  ~  FLP 7/08 showed TChol 196, TG 80, HDL 53, LDL 127...  ~  FLP 6/09 off med showed TChol 237, TG 106, HDL 57, LDL 147... restart  Simva20.  ~  FLP 9/09 on Simva20 showed TChol 147, TG 73, HDL 55, LDL 78  ~  FLP 10/10 on Simva20 showed TChol 172, TG 95, HDL 58, LDL 95  GERD (ICD-530.81) - uses OTC PREVACID 15mg  Prn...  DIVERTICULOSIS OF COLON (ICD-562.10) - last colonoscopy 12/02 by DrGessner w/ divertics only... there is a +fam hx of colon cancer in her youngest daughter... f/u colonoscopy is overdue... IRRITABLE BOWEL SYNDROME (ICD-564.1)  ~  last saw DrDBrodie for GI 2/10 w/ diarrhea, hx IBS, +FamHx ColonCa>> Rx Flagyl, Probiotic; pt refused colonoscopy.  ~  [facility] 1/11 w/ severe gastroenteritis/ diarrhea/ mild dehydration- CDiff neg & Lactoferrin pos- treated w/ Cipro/ Flagyl/ Florastor & resolved.  DEGENERATIVE JOINT DISEASE (ICD-715.90) - she has mild-mod DJD (hands, etc) and Rx w/ Advil, Glucosamine...  Hx of BACK PAIN, LUMBAR (ICD-724.2) - eval by DrWillis in 2005 w/ LBP, some leg paresthesias and MRI showing DDD and mild sp stenosis... Rx'd w/ Aleve and Neurontin at that time...  ANXIETY DEPRESSION (ICD-300.4) - she is quite anxious and uses ATIVAN 1mg - 1/2 to 1 tab Tid Prn + ZOLOFT 50mg - 2 daily (she prev tried weaning off the Zoloft but felt worse off this med)... she notes that depression runs in her family.  ANEMIA (ICD-285.9) & MONOCLONAL GAMMOPATHY (ICD-273.1) - diagnosed 12/10 as below>> rec to start WOMENS MULTIVIT Daily & VIT B12 daily...  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 we plan f/u B12 & SPE/ IEP.Marland KitchenMarland Kitchen   Allergies (verified): No Known Drug Allergies  Comments:  Nurse/Medical Assistant: The patient's medications and allergies were reviewed with the patient and were updated in the Medication and Allergy Lists.  Past History:  Past Medical History:  HEARING LOSS (ICD-389.9) ALLERGIC RHINITIS (ICD-477.9) SICK SINUS SYNDROME (ICD-427.81) PACEMAKER, PERMANENT  (ICD-V45.01) Hx of PERICARDITIS (ICD-423.9) HYPERCHOLESTEROLEMIA (ICD-272.0) GERD (ICD-530.81) IRRITABLE BOWEL SYNDROME (ICD-564.1) DIVERTICULOSIS OF COLON (ICD-562.10) Family Hx of COLON CANCER (ICD-153.9) DEGENERATIVE JOINT DISEASE (ICD-715.90) Hx of BACK PAIN, LUMBAR (ICD-724.2) ANXIETY DEPRESSION (ICD-300.4) ANEMIA (ICD-285.9) MONOCLONAL GAMMOPATHY (ICD-273.1)  Past Surgical History: PACEMAKER, PERMANENT (ICD-V45.01) APPENDECTOMY, HX OF (ICD-V45.79)  Family History: Reviewed history from 11/04/2007 and no changes required. Mother died at age 69 of CHF Father died at age 57 of heart attack 12 Sibs: Heart disease--mother and brother Parkinson's disease--sister Colorectal cancer--daughter Pancreatic cancer--brother and sister Brain cancer--brother  Social History: Reviewed history from 06/13/2008 and no changes required. Widow/Widower Former Smoker--quit approx. 1989 Alcohol use-no Drug use-no Daily Caffeine Use  Review of Systems      See HPI  The patient denies anorexia, fever, weight loss, weight gain, vision loss, decreased hearing, hoarseness, chest pain, syncope, dyspnea on exertion, peripheral edema, prolonged cough, headaches, hemoptysis, abdominal pain, melena, hematochezia, severe indigestion/heartburn, hematuria, incontinence, muscle weakness, suspicious skin lesions, transient blindness, difficulty walking, depression, unusual weight change, abnormal bleeding, enlarged lymph nodes, and angioedema.         Abd pain, nausea, & diarrhea have resolved...  Vital Signs:  Patient profile:   77 year old female Height:      63 inches Weight:      157.13 pounds O2 Sat:      98 % on Room air Temp:     97.1 degrees F oral Pulse rate:   82 / minute BP sitting:   126 / 80  (left arm) Cuff size:   regular  Vitals Entered By: Randell Loop CMA (June 26, 2009 3:43 PM)  O2 Sat at Rest %:  98 O2 Flow:  Room air CC: Post hospital ROV... Comments MEDS UPDATED  TODAY   Physical Exam  Additional Exam:  WD, WN, 77 y/o WF in NAD... GENERAL:  Alert & oriented; pleasant & cooperative... HEENT:  [place]/AT, EOM-wnl, PERRLA, EACs-clear, TMs-wnl, NOSE-clear, THROAT-clear & wnl. NECK:  Supple w/ full ROM; no JVD; normal carotid impulses w/o bruits; no thyromegaly or nodules palpated; no lymphadenopathy. CHEST:  Clear to P & A; without wheezes/ rales/ or rhonchi. HEART:  Regular Rhythm; without murmurs/ rubs/ or gallops. ABDOMEN:  Soft & nontender; normal bowel sounds; no organomegaly or masses detected. EXT: without deformities, mild arthritic changes; no varicose veins/ +venous insuffic/ no edema. NEURO:  CN's intact;  no focal neuro deficits... DERM:  No lesions noted; no rash etc...    MISC. Report  Procedure date:  06/26/2009  Findings:      RECORDS REVIEWED:   ~  Hospital DC Summary, H&P, Labs, stool studies, XRay...  ~  Prev EMR visit 04/12/09 w/ labs reviewed w/ pt...  ~  prev GI eval drDBrodie 06/13/08- EMR    Impression & Recommendations:  Problem # 1:  DIARRHEA (ICD-787.91) Her gastroenteritis has resolved w/ Rx... back to baseline w/ IBS... OK to continue probiotic Rx that seems to be helping...  Problem # 2:  SICK SINUS SYNDROME (ICD-427.81) Pacer stable-  regular rhythm, no CP/ palpit/ etc... Her updated  medication list for this problem includes:    Aspirin 81 Mg Tbec (Aspirin) .Marland Kitchen... 1 tablet by mouth once daily  Problem # 3:  HYPERCHOLESTEROLEMIA (ICD-272.0) Stable on Simva20... Her updated medication list for this problem includes:    Simvastatin 40 Mg Tabs (Simvastatin) .Marland Kitchen... Take 1/2  tablet by mouth once a day  Problem # 4:  ANEMIA (ICD-285.9) We discussed taking the oral B12 supplement which she hadn't yet started... we reviewed her MGUS & plan f/u labs yearly... Her updated medication list for this problem includes:    Vitamin B-12 1000 Mcg Tabs (Cyanocobalamin) .Marland Kitchen... Take 1 tab by mouth once daily...  Problem # 5:   ANXIETY DEPRESSION (ICD-300.4) She will continue on the Zoloft regularly & the Alpraz Prn...  Complete Medication List: 1)  Aspirin 81 Mg Tbec (Aspirin) .Marland Kitchen.. 1 tablet by mouth once daily 2)  Simvastatin 40 Mg Tabs (Simvastatin) .... Take 1/2  tablet by mouth once a day 3)  Prevacid 30 Mg Cpdr (Lansoprazole) .Marland Kitchen.. 1 tab once daily 4)  Advil 200 Mg Tabs (Ibuprofen) .... As needed 5)  Glucosamine 500 Mg Caps (Glucosamine sulfate) .... 2 by mouth daily 6)  Calcium 500/d 500-200 Mg-unit Tabs (Calcium carbonate-vitamin d) .Marland Kitchen.. 1 tablet by mouth once daily 7)  Womens Multivitamin Plus Tabs (Multiple vitamins-minerals) .... Take 1 tab by mouth once daily.Marland KitchenMarland Kitchen 8)  Vitamin B-12 1000 Mcg Tabs (Cyanocobalamin) .... Take 1 tab by mouth once daily.Marland KitchenMarland Kitchen 9)  Zoloft 50 Mg Tabs (Sertraline hcl) .... Take 2  tablet by mouth once a day 10)  Ativan 1 Mg Tabs (Lorazepam) .... Take 1/2 to 1 tab by mouth three times a day as needed for anxiety...  Patient Instructions: 1)  Today we updated your med list- see below.... 2)  Continue your current meds the same... 3)  Reminder to take a Women's Multivit w/ 1000 u Vit D...  plus a Vit B 12 tablet w/ of B12 daily.Marland KitchenMarland Kitchen 4)  Let's plan a follow up OV in about 6 months to see how you are doing & recheck that blood work.Marland KitchenMarland Kitchen

## 2010-06-10 NOTE — Assessment & Plan Note (Signed)
Summary: f1y   Visit Type:  Follow-up Primary Provider:  Alroy Dust, MD  CC:  palpitations.  History of Present Illness: [name] is 77 years old and return for management of her pacemaker. She had a DDD pacemaker put in for sick sinus syndrome and I believe neurocardiogenic syncope. She had a generator change in 2000. She has done quite well since that time has had no recent chest pain shortness of breath. She does have occasional palpitations. She does run a high threshold on her atrial lead.   Her past history is significant for pericarditis and hyperlipidemia.  Current Medications (verified): 1)  Aspirin 81 Mg Tbec (Aspirin) .Marland Kitchen.. 1 Tablet By Mouth Once Daily 2)  Simvastatin 40 Mg  Tabs (Simvastatin) .... Take 1/2  Tablet By Mouth Once A Day 3)  Fish Oil 1000 Mg Caps (Omega-3 Fatty Acids) .... Take One Capsule By Mouth Once Daily 4)  Advil 200 Mg  Tabs (Ibuprofen) .... As Needed 5)  Glucosamine 500 Mg  Caps (Glucosamine Sulfate) .... 2 By Mouth Daily 6)  Calcium 500/d 500-200 Mg-Unit Tabs (Calcium Carbonate-Vitamin D) .Marland Kitchen.. 1 Tablet By Mouth Once Daily 7)  Womens Multivitamin Plus  Tabs (Multiple Vitamins-Minerals) .... Take 1 Tab By Mouth Once Daily.Marland KitchenMarland Kitchen 8)  Vitamin B-12 1000 Mcg Tabs (Cyanocobalamin) .... Take 1 Tab By Mouth Once Daily.Marland KitchenMarland Kitchen 9)  Zoloft 50 Mg  Tabs (Sertraline Hcl) .... Take 2  Tablet By Mouth Once A Day 10)  Ativan 1 Mg  Tabs (Lorazepam) .... Take 1/2 To 1 Tab By Mouth Three Times A Day As Needed For Anxiety...  Allergies (verified): No Known Drug Allergies  Past History:  Past Medical History: Reviewed history from 01/31/2010 and no changes required. HEARING LOSS (ICD-389.9) ALLERGIC RHINITIS (ICD-477.9) SICK SINUS SYNDROME (ICD-427.81) PACEMAKER, PERMANENT (ICD-V45.01) Hx of PERICARDITIS (ICD-423.9) HYPERCHOLESTEROLEMIA (ICD-272.0) GERD (ICD-530.81) IRRITABLE BOWEL SYNDROME (ICD-564.1) DIVERTICULOSIS OF COLON (ICD-562.10) Family Hx of COLON CANCER  (ICD-153.9) DEGENERATIVE JOINT DISEASE (ICD-715.90) Hx of BACK PAIN, LUMBAR (ICD-724.2) ANXIETY DEPRESSION (ICD-300.4) ANEMIA (ICD-285.9) MONOCLONAL GAMMOPATHY (ICD-273.1)  Review of Systems       ROS is negative except as outlined in HPI.   Vital Signs:  Patient profile:   77 year old female Height:      63 inches Weight:      149 pounds BMI:     26.49 Pulse rate:   76 / minute Resp:     16 per minute BP sitting:   131 / 71  (right arm)  Vitals Entered By: Tamara Coy, CNA (February 11, 2010 1:42 PM)  Physical Exam  Additional Exam:  Gen. Well-nourished, in no distress   Neck: No JVD, thyroid not enlarged, no carotid bruits Lungs: No tachypnea, clear without rales, rhonchi or wheezes Cardiovascular: Rhythm regular, PMI not displaced,  heart sounds  normal, no murmurs or gallops, no peripheral edema, pulses normal in all 4 extremities. Abdomen: BS normal, abdomen soft and non-tender without masses or organomegaly, no hepatosplenomegaly. MS: No deformities, no cyanosis or clubbing   Neuro:  No focal sns   Skin:  no lesions    PPM Specifications Following MD:  Everardo Beals. Juanda Chance, MD     PPM Vendor:  [employer]     PPM Model Number:  [id number]     PPM Serial Number:  [id number] PPM DOI:  03/09/2008      Lead 1    Location: RA     DOI: 09/17/1994     Model #: [id number]  Serial #: [id number]     Lead 2    Location: RV     DOI: 09/17/1994     Model #: [id number]     Serial #: [id number]      PPM Follow Up Battery Voltage:  2.79 V     Battery Est. Longevity:  5.50-10 yrs       PPM Device Measurements Atrium  Amplitude: 1.4 mV, Impedance: 319 ohms, Threshold: 2.75 V at 0.7 msec Right Ventricle  Amplitude: 5.4 mV, Impedance: 506 ohms, Threshold: 0.875 V at 0.5 msec  Episodes MS Episodes:  2     Percent Mode Switch:  <1%     Ventricular High Rate:  0     Atrial Pacing:  <1%     Ventricular Pacing:  <1%  Parameters Mode:  DDD     Lower Rate Limit:  50     Upper Rate Limit:  110 Paced AV Delay:  200      Sensed AV Delay:  150 Next Cardiology Appt Due:  08/11/2010 Tech Comments:  NORMAL DEVICE FUNCTION.  CHANGED RV SENSITIVITY FROM 2.0 TO 1.49mV.  ROV IN 6 MTHS W/DEVICE CLINIC. Vella Kohler  February 11, 2010 2:23 PM  Impression & Recommendations:  Problem # 1:  PACEMAKER, PERMANENT (ICD-V45.01) We interrogated her pacemaker today. She is pacing less than 1% of the time on both chambers. She is programmed at a rate of 50 with a long AV delay. She does have a high threshold the atrial lead but this does not appear to be a problem. We will plan telephone checks and followup with Dr. Johney Frame in one year.  Problem # 2:  HYPERCHOLESTEROLEMIA (ICD-272.0) This is being treated with simvastatin and followed by her primary care physician. Her updated medication list for this problem includes:    Simvastatin 40 Mg Tabs (Simvastatin) .Marland Kitchen... Take 1/2  tablet by mouth once a day  Other Orders: EKG w/ Interpretation (93000)  Patient Instructions: 1)  Your physician recommends that you continue on your current medications as directed. Please refer to the Current Medication list given to you today. 2)  Your physician wants you to follow-up in: 1 year with Dr. Johney Frame.  You will receive a reminder letter in the mail two months in advance. If you don't receive a letter, please call our office to schedule the follow-up appointment.

## 2010-06-10 NOTE — Cardiovascular Report (Signed)
Summary: TTM   TTM   Imported By: Roderic Ovens 06/18/2009 16:05:30  _____________________________________________________________________  External Attachment:    Type:   Image     Comment:   External Document

## 2010-06-10 NOTE — Assessment & Plan Note (Signed)
Summary: 6 months/apc   Primary Care Provider:  Alroy Dust, MD  CC:  6 month ROV & review of mult medical problems....  History of Present Illness: 77 y/o [name] here for a follow up visit... she has multiple medical problems as noted below...   ~  April 12, 2009:  she's had a good year- remains on Zoloft regularly + Ativan Prn... she saw DrBBrodie 10/10 for pacer check- generator changed 10/09 & doing well... she had atypic CP 1/10 w/ Myoview that was neg... she saw DrDBrodie 2/10 w/ diarrhea- hx IBS, Rx'd w/ probiotic & Flagyl... she is due for f/u colon & will call DrBrodie to set up... she refuses Flu vaccine (but actually received the 2010 vaccine 2/11 in hosp-see below).   ~  June 26, 2009:  she was hosp 1/30- 06/13/09 by [facility] at [facility] for severe gastroenteritis w/ N/ V/ Diarrhea, leukocytosis, mild dehydration, etc... CDiff neg, but Lactoferrin pos- treated empirically w/ Cipro/ Flagyl (plus Zofran & Florastor) & improved back to baseline...   ~  December 23, 2009:  under incr stress w/ twin sister dx w/ lung cancer & very demanding... she's c/o abd pain intermittently, cramping/ IBS-like discomfort & we discussed trial BENTYL for the pain... she needs GYN check up & she will call to arrange this... pacer working well w/ teletrace monitoring... she wants to wait for f/u fasting blood work til the spring...    Current Problem List:  HEARING LOSS (ICD-389.9) - she wears bilat hearing aides & has been tested by DrPahel...  ALLERGIC RHINITIS (ICD-477.9) - uses OTC antihistamines Prn...  SICK SINUS SYNDROME (ICD-427.81) - s/p pacemaker placed in 1996 for SSS... yearly pacer f/u by DrBrodie- generator changed 10/09 & last seen 10/10 doing well...  Hx of PERICARDITIS (ICD-423.9) - hosp in 2001 w/ CP & acute pericarditis... serial Echo's showed resolution of the effusion...  ~  2DEcho 5/04 showed a prob patent foramen ovale w/ left to right interatrial shunt by doppler... DrBBrodie is aware &  following.  ~  NuclearStressTest 5/04 was negative- no ischemia, no infarct, EF= 65%...  ~  repeat Nuclear Stress Test 1/10 was neg- no scar or ischemia, EF=74%...  HYPERCHOLESTEROLEMIA (ICD-272.0) - on SIMVASTATIN 20mg /d & low fat diet...  ~  FLP 7/08 showed TChol 196, TG 80, HDL 53, LDL 127...  ~  FLP 6/09 off med showed TChol 237, TG 106, HDL Tamara, LDL 147... restart Simva20.  ~  FLP 9/09 on Simva20 showed TChol 147, TG 73, HDL 55, LDL 78  ~  FLP 10/10 on Simva20 showed TChol 172, TG 95, HDL 58, LDL 95  GERD (ICD-530.81) - uses OTC PREVACID 15mg  Prn...  DIVERTICULOSIS OF COLON (ICD-562.10) - last colonoscopy 12/02 by DrGessner w/ divertics only... there is a +fam hx of colon cancer in her youngest daughter... f/u colonoscopy is overdue (she refused).  IRRITABLE BOWEL SYNDROME (ICD-564.1) - trial BENTYL 20mg  Prn for abd cramping...  ~  last saw DrDBrodie for GI 2/10 w/ diarrhea, hx IBS, +FamHx ColonCa>> Rx Flagyl, Probiotic; pt refused colonoscopy.  ~  [facility] 1/11 w/ severe gastroenteritis/ diarrhea/ mild dehydration- CDiff neg & Lactoferrin pos- treated w/ Cipro/ Flagyl/ Florastor & resolved.  DEGENERATIVE JOINT DISEASE (ICD-715.90) - she has mild-mod DJD (hands, etc) and Rx w/ Advil, Glucosamine...  Hx of BACK PAIN, LUMBAR (ICD-724.2) - eval by DrWillis in 2005 w/ LBP, some leg paresthesias and MRI showing DDD and mild sp stenosis... Rx'd w/ Aleve and Neurontin at that time.Marland KitchenMarland Kitchen  ANXIETY DEPRESSION (ICD-300.4) - she is quite anxious and uses ATIVAN 1mg - 1/2 to 1 tab Tid Prn + ZOLOFT 50mg - 2 daily (she prev tried weaning off the Zoloft but felt worse off this med)... she notes that depression runs in her family.  ANEMIA (ICD-285.9) & MONOCLONAL GAMMOPATHY (ICD-273.1) - diagnosed 12/10 as below>> rec to start WOMENS MULTIVIT Daily & VIT B12 daily...  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 we plan f/u B12 & SPE/ IEP...   Preventive Screening-Counseling & Management  Alcohol-Tobacco     Smoking Status: quit     Year Quit: 1989  Caffeine-Diet-Exercise     Caffeine use/day: 2  Allergies (verified): No Known Drug Allergies  Comments:  Nurse/Medical Assistant: The patient's medications and allergies were reviewed with the patient and were updated in the Medication and Allergy Lists.  Past History:  Past Medical History: HEARING LOSS (ICD-389.9) ALLERGIC RHINITIS (ICD-477.9) SICK SINUS SYNDROME (ICD-427.81) PACEMAKER, PERMANENT (ICD-V45.01) Hx of PERICARDITIS (ICD-423.9) HYPERCHOLESTEROLEMIA (ICD-272.0) GERD (ICD-530.81) IRRITABLE BOWEL SYNDROME (ICD-564.1) DIVERTICULOSIS OF COLON (ICD-562.10) Family Hx of COLON CANCER (ICD-153.9) DEGENERATIVE JOINT DISEASE (ICD-715.90) Hx of BACK PAIN, LUMBAR (ICD-724.2) ANXIETY DEPRESSION (ICD-300.4) ANEMIA (ICD-285.9) MONOCLONAL GAMMOPATHY (ICD-273.1)  Past Surgical History: PACEMAKER, PERMANENT (ICD-V45.01) APPENDECTOMY, HX OF (ICD-V45.79)  Family History: Reviewed history from 11/04/2007 and no changes required. Mother died at age 65 of CHF Father died at age 37 of heart attack 65 Sibs: Heart disease--mother and brother Parkinson's disease--sister Colorectal cancer--daughter Pancreatic cancer--brother and sister Brain cancer--brother  Social History: Reviewed history from 06/13/2008 and no changes required. Widow/Widower Former Smoker--quit approx. 1989 Alcohol use-no Drug use-no Daily Caffeine Use  Review of Systems      See HPI       She is under a considerable amt of stress...   Vital Signs:  Patient profile:   77 year old female Height:      63 inches Weight:      150.25 pounds BMI:     26.71 O2 Sat:      97 % on Room air Temp:     97.6 degrees F oral Pulse rate:   65 / minute BP sitting:   132 / 68  (right arm) Cuff size:   regular  Vitals Entered By: Randell Loop CMA (December 23, 2009 12:06 PM)  O2 Sat at Rest %:  97 O2 Flow:  Room air CC: 6 month ROV & review of mult medical problems... Is Patient Diabetic? No Pain Assessment Patient in pain? yes      Onset of pain  lower abd pain Comments meds updated today with pt   Physical Exam  Additional Exam:  WD, WN, 77 y/o [name] in NAD... GENERAL:  Alert & oriented; pleasant & cooperative... HEENT:  [place]/AT, EOM-wnl, PERRLA, EACs- hearing aides, NOSE-clear, THROAT-clear & wnl. NECK:  Supple w/ full ROM; no JVD; normal carotid impulses w/o bruits; no thyromegaly or nodules palpated; no lymphadenopathy. CHEST:  Clear to P & A; without wheezes/ rales/ or rhonchi. HEART:  Regular Rhythm; without murmurs/ rubs/ or gallops. ABDOMEN:  Soft & nontender; normal bowel sounds; no organomegaly or masses detected. EXT: without deformities, mild arthritic changes; no varicose veins/ +venous insuffic/ no edema. NEURO:  CN's intact;  no focal neuro deficits... DERM:  No lesions noted; no rash etc...    Impression & Recommendations:  Problem # 1:  SICK SINUS SYNDROME (  ICD-427.81) Pacer functioning well, teletrace reviewed... she has f/u DrBrodie for Cards 10/11... Her updated medication list for this problem includes:    Aspirin 81 Mg Tbec (Aspirin) .Marland Kitchen... 1 tablet by mouth once daily  Problem # 2:  HYPERCHOLESTEROLEMIA (ICD-272.0) Controlled on the Simva20 + diet... Her updated medication list for this problem includes:    Simvastatin 40 Mg Tabs (Simvastatin) .Marland Kitchen... Take 1/2  tablet by mouth once a day  Problem # 3:  IRRITABLE BOWEL SYNDROME (ICD-564.1) Her CC= abd cramping c/w IBS... discussed trial Bentyl, etc... she will f/u w/ DrDBrodie as needed...  Problem # 4:  DEGENERATIVE JOINT DISEASE (ICD-715.90) Stable w/ OTC meds and exerc program... Her updated medication list for this problem includes:    Aspirin 81 Mg Tbec (Aspirin) .Marland Kitchen... 1 tablet by mouth once daily    Advil 200 Mg Tabs (Ibuprofen)  .Marland Kitchen... As needed  Problem # 5:  ANXIETY DEPRESSION (ICD-300.4) Stable on the Zoloft + Ativan... incr stress from sister w/ lung ca...  Problem # 6:  OTHER MEDICAL PROBLEMS AS NOTED>>>  Complete Medication List: 1)  Aspirin 81 Mg Tbec (Aspirin) .Marland Kitchen.. 1 tablet by mouth once daily 2)  Simvastatin 40 Mg Tabs (Simvastatin) .... Take 1/2  tablet by mouth once a day 3)  Advil 200 Mg Tabs (Ibuprofen) .... As needed 4)  Glucosamine 500 Mg Caps (Glucosamine sulfate) .... 2 by mouth daily 5)  Calcium 500/d 500-200 Mg-unit Tabs (Calcium carbonate-vitamin d) .Marland Kitchen.. 1 tablet by mouth once daily 6)  Womens Multivitamin Plus Tabs (Multiple vitamins-minerals) .... Take 1 tab by mouth once daily.Marland KitchenMarland Kitchen 7)  Vitamin B-12 1000 Mcg Tabs (Cyanocobalamin) .... Take 1 tab by mouth once daily.Marland KitchenMarland Kitchen 8)  Zoloft 50 Mg Tabs (Sertraline hcl) .... Take 2  tablet by mouth once a day 9)  Ativan 1 Mg Tabs (Lorazepam) .... Take 1/2 to 1 tab by mouth three times a day as needed for anxiety... 10)  Fish Oil 1000 Mg Caps (Omega-3 fatty acids) .... Take one capsule by mouth once daily 11)  Dicyclomine Hcl 20 Mg Tabs (Dicyclomine hcl) .... Take 1 tab by mouth every 4-6 h as needed for abd cramping...  Patient Instructions: 1)  Today we updated your med list- see below.... 2)  We wrote a new perscription for Dicyclomine to take everyu 4-6H as needed for the abd pain.Marland KitchenMarland Kitchen  3)  Be sure to check in w/ your gynecologist for a check up... 4)  Call for any problems.Marland KitchenMarland Kitchen 5)  Let's plan a follow up appt in the spring w/ FASTING blood work at that time... Prescriptions: DICYCLOMINE HCL 20 MG TABS (DICYCLOMINE HCL) take 1 tab by mouth every 4-6 H as needed for abd cramping...  #50 x 5   Entered and Authorized by:   Michele Mcalpine MD   Signed by:   Michele Mcalpine MD on 12/23/2009   Method used:   Print then Give to Patient   RxID:   [id number]    Immunization History:  Influenza Immunization History:    Influenza:  historical (05/28/2009)

## 2010-06-10 NOTE — Assessment & Plan Note (Signed)
Summary: ear infection/ok per leigh/mhh   Primary Care Provider:  Alroy Dust, MD  CC:  Add-on for left ear infection....  History of Present Illness: 77 y/o WF here for a follow up visit... she has multiple medical problems as noted below...   ~  April 12, 2009:  she's had a good year- remains on Zoloft regularly + Ativan Prn... she saw DrBBrodie 10/10 for pacer check- generator changed 10/09 & doing well... she had atypic CP 1/10 w/ Myoview that was neg... she saw DrDBrodie 2/10 w/ diarrhea- hx IBS, Rx'd w/ probiotic & Flagyl... she is due for f/u colon & will call DrBrodie to set up... she refuses Flu vaccine (but actually received the 2010 vaccine 2/11 in hosp-see below).   ~  June 26, 2009:  she was hosp 1/30- 06/13/09 by [facility] at [facility] for severe gastroenteritis w/ N/ V/ Diarrhea, leukocytosis, mild dehydration, etc... CDiff neg, but Lactoferrin pos- treated empirically w/ Cipro/ Flagyl (plus Zofran & Florastor) & improved back to baseline...   ~  December 23, 2009:  under incr stress w/ twin sister dx w/ lung cancer & very demanding... she's c/o abd pain intermittently, cramping/ IBS-like discomfort & we discussed trial BENTYL for the pain... she needs GYN check up & she will call to arrange this... pacer working well w/ teletrace monitoring... she wants to wait for f/u fasting blood work til the spring...   ~  January 31, 2010:  2d hx left ear pain- went to audiologist & sent here for ear infection... min drainage, no fever/ chills/ sweats, no adenopathy... exam shows some exud & inflammed EAC> we discussed Rx w/ Cortisporin Otic + Augmentin orally... she has bilat hearing aides & some further decr hearing noted... she will f/u w/ DrCrossley as needed.    Current Problem List:  HEARING LOSS (ICD-389.9) - she wears bilat hearing aides & has been tested by DrPahel...  ALLERGIC RHINITIS (ICD-477.9) - uses OTC antihistamines Prn...  SICK SINUS SYNDROME (ICD-427.81) - s/p pacemaker placed  in 1996 for SSS... yearly pacer f/u by DrBrodie- generator changed 10/09 & last seen 10/10 doing well...  Hx of PERICARDITIS (ICD-423.9) - hosp in 2001 w/ CP & acute pericarditis... serial Echo's showed resolution of the effusion...  ~  2DEcho 5/04 showed a prob patent foramen ovale w/ left to right interatrial shunt by doppler... DrBBrodie is aware & following.  ~  NuclearStressTest 5/04 was negative- no ischemia, no infarct, EF= 65%...  ~  repeat Nuclear Stress Test 1/10 was neg- no scar or ischemia, EF=74%...  HYPERCHOLESTEROLEMIA (ICD-272.0) - on SIMVASTATIN 20mg /d & low fat diet...  ~  FLP 7/08 showed TChol 196, TG 80, HDL 53, LDL 127...  ~  FLP 6/09 off med showed TChol 237, TG 106, HDL 57, LDL 147... restart Simva20.  ~  FLP 9/09 on Simva20 showed TChol 147, TG 73, HDL 55, LDL 78  ~  FLP 10/10 on Simva20 showed TChol 172, TG 95, HDL 58, LDL 95  GERD (ICD-530.81) - uses OTC PREVACID 15mg  Prn...  DIVERTICULOSIS OF COLON (ICD-562.10) - last colonoscopy 12/02 by DrGessner w/ divertics only... there is a +fam hx of colon cancer in her youngest daughter... f/u colonoscopy is overdue (she refused).  IRRITABLE BOWEL SYNDROME (ICD-564.1) - trial BENTYL 20mg  Prn for abd cramping...  ~  last saw DrDBrodie for GI 2/10 w/ diarrhea, hx IBS, +FamHx ColonCa>> Rx Flagyl, Probiotic; pt refused colonoscopy.  ~  [facility] 1/11 w/ severe gastroenteritis/ diarrhea/ mild dehydration- CDiff neg &  Lactoferrin pos- treated w/ Cipro/ Flagyl/ Florastor & resolved.  DEGENERATIVE JOINT DISEASE (ICD-715.90) - she has mild-mod DJD (hands, etc) and Rx w/ Advil, Glucosamine...  Hx of BACK PAIN, LUMBAR (ICD-724.2) - eval by DrWillis in 2005 w/ LBP, some leg paresthesias and MRI showing DDD and mild sp stenosis... Rx'd w/ Aleve and Neurontin at that time...  ANXIETY DEPRESSION (ICD-300.4) - she is quite anxious and uses ATIVAN 1mg - 1/2 to 1 tab Tid Prn + ZOLOFT 50mg - 2 daily (she prev tried weaning off the Zoloft but felt  worse off this med)... she notes that depression runs in her family.  ANEMIA (ICD-285.9) & MONOCLONAL GAMMOPATHY (ICD-273.1) - diagnosed 12/10 as below>> rec to start WOMENS MULTIVIT Daily & VIT B12 daily...  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 we plan f/u B12 & SPE/ IEP...   Preventive Screening-Counseling & Management  Alcohol-Tobacco     Smoking Status: quit     Year Quit: 1989  Caffeine-Diet-Exercise     Caffeine use/day: 2  Allergies (verified): No Known Drug Allergies  Comments:  Nurse/Medical Assistant: The patient's medications and allergies were reviewed with the patient and were updated in the Medication and Allergy Lists.  Past History:  Past Medical History: HEARING LOSS (ICD-389.9) ALLERGIC RHINITIS (ICD-477.9) SICK SINUS SYNDROME (ICD-427.81) PACEMAKER, PERMANENT (ICD-V45.01) Hx of PERICARDITIS (ICD-423.9) HYPERCHOLESTEROLEMIA (ICD-272.0) GERD (ICD-530.81) IRRITABLE BOWEL SYNDROME (ICD-564.1) DIVERTICULOSIS OF COLON (ICD-562.10) Family Hx of COLON CANCER (ICD-153.9) DEGENERATIVE JOINT DISEASE (ICD-715.90) Hx of BACK PAIN, LUMBAR (ICD-724.2) ANXIETY DEPRESSION (ICD-300.4) ANEMIA (ICD-285.9) MONOCLONAL GAMMOPATHY (ICD-273.1)  Past Surgical History: PACEMAKER, PERMANENT (ICD-V45.01) APPENDECTOMY, HX OF (ICD-V45.79)  Family History: Reviewed history from 12/23/2009 and no changes required. Mother died at age 45 of CHF Father died at age 59 of heart attack 5 Sibs: Heart disease--mother and brother Parkinson's disease--sister Colorectal cancer--daughter Pancreatic cancer--brother and sister Brain cancer--brother  Social History: Reviewed history from 06/13/2008 and no changes required. Widow/Widower Former Smoker--quit approx. 1989 Alcohol use-no Drug use-no Daily Caffeine Use  Review of Systems      See HPI        The patient complains of decreased hearing.  The patient denies anorexia, fever, weight loss, weight gain, vision loss, hoarseness, chest pain, syncope, dyspnea on exertion, peripheral edema, prolonged cough, headaches, hemoptysis, abdominal pain, melena, hematochezia, severe indigestion/heartburn, hematuria, incontinence, muscle weakness, suspicious skin lesions, transient blindness, difficulty walking, depression, unusual weight change, abnormal bleeding, enlarged lymph nodes, and angioedema.    Vital Signs:  Patient profile:   77 year old female Height:      63 inches Weight:      150.25 pounds BMI:     26.71 O2 Sat:      99 % on Room air Temp:     97.6 degrees F oral Pulse rate:   69 / minute BP sitting:   146 / 80  (left arm) Cuff size:   regular  Vitals Entered By: Randell Loop CMA (January 31, 2010 3:32 PM)  O2 Sat at Rest %:  99 O2 Flow:  Room air CC: Add-on for left ear infection... Is Patient Diabetic? No Pain Assessment Patient in pain? yes      Onset of pain  left ear pain Comments meds updated today with pt   Physical Exam  Additional Exam:  WD, WN, 77 y/o WF in NAD... GENERAL:  Alert & oriented; pleasant & cooperative... HEENT:  [place]/AT, EOM-wnl, PERRLA, EACs- hearing aides bilat & left EAC exud/ sl red/ inflammed, NOSE-clear, THROAT-clear & wnl. NECK:  Supple w/ full ROM; no JVD; normal carotid impulses w/o bruits; no thyromegaly or nodules palpated; no lymphadenopathy. CHEST:  Clear to P & A; without wheezes/ rales/ or rhonchi. HEART:  Regular Rhythm; without murmurs/ rubs/ or gallops. ABDOMEN:  Soft & nontender; normal bowel sounds; no organomegaly or masses detected. EXT: without deformities, mild arthritic changes; no varicose veins/ +venous insuffic/ no edema. NEURO:  CN's intact;  no focal neuro deficits... DERM:  No lesions noted; no rash etc...    Impression & Recommendations:  Problem # 1:  EXTERNAL OTITIS (ICD-380.10) We discussed Rx w/  Cortisporin Otic + Augmentin 875mg  po Bid... she may need to have the left canal lavaged clear & will f/u w/ DrCrossley after the acute infection has resolved... Her updated medication list for this problem includes:    Cortisporin 3.5-10000-1 Soln (Neomycin-polymyxin-hc) ..... Instill 3 drops in left ear three times a day.  Problem # 2:  SICK SINUS SYNDROME (ICD-427.81) She has pacer & stable... Her updated medication list for this problem includes:    Aspirin 81 Mg Tbec (Aspirin) .Marland Kitchen... 1 tablet by mouth once daily  Problem # 3:  HYPERCHOLESTEROLEMIA (ICD-272.0) Stable on Simva20... Her updated medication list for this problem includes:    Simvastatin 40 Mg Tabs (Simvastatin) .Marland Kitchen... Take 1/2  tablet by mouth once a day  Problem # 4:  GERD (ICD-530.81) GI is stable>  continue same meds... Her updated medication list for this problem includes:    Dicyclomine Hcl 20 Mg Tabs (Dicyclomine hcl) .Marland Kitchen... Take 1 tab by mouth every 4-6 h as needed for abd cramping...  Problem # 5:  DEGENERATIVE JOINT DISEASE (ICD-715.90) She remains stable & quite mobile... Her updated medication list for this problem includes:    Aspirin 81 Mg Tbec (Aspirin) .Marland Kitchen... 1 tablet by mouth once daily    Advil 200 Mg Tabs (Ibuprofen) .Marland Kitchen... As needed  Problem # 6:  ANXIETY DEPRESSION (ICD-300.4) Mod stress from twin sister's illness & demands on her time...  Complete Medication List: 1)  Aspirin 81 Mg Tbec (Aspirin) .Marland Kitchen.. 1 tablet by mouth once daily 2)  Simvastatin 40 Mg Tabs (Simvastatin) .... Take 1/2  tablet by mouth once a day 3)  Fish Oil 1000 Mg Caps (Omega-3 fatty acids) .... Take one capsule by mouth once daily 4)  Dicyclomine Hcl 20 Mg Tabs (Dicyclomine hcl) .... Take 1 tab by mouth every 4-6 h as needed for abd cramping... 5)  Advil 200 Mg Tabs (Ibuprofen) .... As needed 6)  Glucosamine 500 Mg Caps (Glucosamine sulfate) .... 2 by mouth daily 7)  Calcium 500/d 500-200 Mg-unit Tabs (Calcium carbonate-vitamin d)  .Marland Kitchen.. 1 tablet by mouth once daily 8)  Womens Multivitamin Plus Tabs (Multiple vitamins-minerals) .... Take 1 tab by mouth once daily.Marland KitchenMarland Kitchen 9)  Vitamin B-12 1000 Mcg Tabs (Cyanocobalamin) .... Take 1 tab by mouth once daily... 10)  Zoloft 50 Mg Tabs (Sertraline hcl) .... Take 2  tablet by mouth once a day 11)  Ativan 1 Mg Tabs (Lorazepam) .... Take 1/2 to 1 tab by mouth three times a day as needed for anxiety... 12)  Cortisporin 3.5-10000-1 Soln (Neomycin-polymyxin-hc) .... Instill 3 drops in left ear three times a day. 13)  Augmentin 875-125 Mg Tabs (Amoxicillin-pot clavulanate) .... Take 1 tab by mouth two times a day til gone...  Patient Instructions: 1)  Today we updated your med list-  see below.... 2)  We wrote new perscriptions for an ear drop> Cortisporin Otic 3 drops in left ear three times a day, & and antibiotic pill= augmentin to take twice daily.Marland KitchenMarland Kitchen 3)  Call for any questions.Marland KitchenMarland Kitchen 4)  You may need to have DrCrossley wash out that ear canal later... Prescriptions: AUGMENTIN 875-125 MG TABS (AMOXICILLIN-POT CLAVULANATE) take 1 tab by mouth two times a day til gone...  #14 x 1   Entered and Authorized by:   Michele Mcalpine MD   Signed by:   Michele Mcalpine MD on 01/31/2010   Method used:   Print then Give to Patient   RxID:   [id number] CORTISPORIN 3.5-10000-1 SOLN [facility]) instill 3 drops in left ear three times a day.  #1 vial x 2   Entered and Authorized by:   Michele Mcalpine MD   Signed by:   Michele Mcalpine MD on 01/31/2010   Method used:   Print then Give to Patient   RxID:   [id number]

## 2010-06-10 NOTE — Cardiovascular Report (Signed)
Summary: TTM   TTM   Imported By: Roderic Ovens 11/22/2009 15:47:26  _____________________________________________________________________  External Attachment:    Type:   Image     Comment:   External Document

## 2010-06-12 ENCOUNTER — Ambulatory Visit (INDEPENDENT_AMBULATORY_CARE_PROVIDER_SITE_OTHER): Payer: 59 | Admitting: Professional

## 2010-06-12 DIAGNOSIS — F4323 Adjustment disorder with mixed anxiety and depressed mood: Secondary | ICD-10-CM

## 2010-06-12 NOTE — Assessment & Plan Note (Signed)
Summary: Acute NP office visit - URI   Primary Provider/Referring Provider:  Alroy Dust, MD  CC:  prod cough with yellow mucus, wheezing, and increased SOB x1week - denies f/c/s.  History of Present Illness:  77 year old female with known history of hyperlipidemia, DJD and GERD  May 27, 2010 --Presents for an acute office visit. Complains of prod cough with yellow mucus, wheezing, increased SOB x1week. Cough is aggravating with thick mucus and drainage in throat. OTC not helping. Denies chest pain,  orthopnea, hemoptysis, fever, n/v/d, edema, headache.   Medications Prior to Update: 1)  Aspirin 81 Mg Tbec (Aspirin) .Marland Kitchen.. 1 Tablet By Mouth Once Daily 2)  Simvastatin 40 Mg  Tabs (Simvastatin) .... Take 1/2  Tablet By Mouth Once A Day 3)  Fish Oil 1000 Mg Caps (Omega-3 Fatty Acids) .... Take One Capsule By Mouth Once Daily 4)  Advil 200 Mg  Tabs (Ibuprofen) .... As Needed 5)  Glucosamine 500 Mg  Caps (Glucosamine Sulfate) .... 2 By Mouth Daily 6)  Calcium 500/d 500-200 Mg-Unit Tabs (Calcium Carbonate-Vitamin D) .Marland Kitchen.. 1 Tablet By Mouth Once Daily 7)  Womens Multivitamin Plus  Tabs (Multiple Vitamins-Minerals) .... Take 1 Tab By Mouth Once Daily.Marland KitchenMarland Kitchen 8)  Vitamin B-12 1000 Mcg Tabs (Cyanocobalamin) .... Take 1 Tab By Mouth Once Daily.Marland KitchenMarland Kitchen 9)  Zoloft 50 Mg  Tabs (Sertraline Hcl) .... Take 2  Tablet By Mouth Once A Day 10)  Ativan 1 Mg  Tabs (Lorazepam) .... Take 1/2 To 1 Tab By Mouth Three Times A Day As Needed For Anxiety...  Current Medications (verified): 1)  Aspirin 81 Mg Tbec (Aspirin) .Marland Kitchen.. 1 Tablet By Mouth Once Daily 2)  Simvastatin 40 Mg  Tabs (Simvastatin) .... ***hold*** Take 1/2  Tablet By Mouth Once A Day 3)  Fish Oil 1000 Mg Caps (Omega-3 Fatty Acids) .... Take One Capsule By Mouth Once Daily 4)  Advil 200 Mg  Tabs (Ibuprofen) .... As Needed 5)  Glucosamine 500 Mg  Caps (Glucosamine Sulfate) .... 2 By Mouth Daily 6)  Calcium 500/d 500-200 Mg-Unit Tabs (Calcium Carbonate-Vitamin D)  .Marland Kitchen.. 1 Tablet By Mouth Once Daily 7)  Womens Multivitamin Plus  Tabs (Multiple Vitamins-Minerals) .... Take 1 Tab By Mouth Once Daily.Marland KitchenMarland Kitchen 8)  Vitamin B-12 1000 Mcg Tabs (Cyanocobalamin) .... Take 1 Tab By Mouth Once Daily.Marland KitchenMarland Kitchen 9)  Zoloft 50 Mg  Tabs (Sertraline Hcl) .... Take 2  Tablet By Mouth Once A Day 10)  Ativan 1 Mg  Tabs (Lorazepam) .... Take 1/2 To 1 Tab By Mouth Three Times A Day As Needed For Anxiety...  Allergies (verified): No Known Drug Allergies  Past History:  Past Medical History: Last updated: 01/31/2010 HEARING LOSS (ICD-389.9) ALLERGIC RHINITIS (ICD-477.9) SICK SINUS SYNDROME (ICD-427.81) PACEMAKER, PERMANENT (ICD-V45.01) Hx of PERICARDITIS (ICD-423.9) HYPERCHOLESTEROLEMIA (ICD-272.0) GERD (ICD-530.81) IRRITABLE BOWEL SYNDROME (ICD-564.1) DIVERTICULOSIS OF COLON (ICD-562.10) Family Hx of COLON CANCER (ICD-153.9) DEGENERATIVE JOINT DISEASE (ICD-715.90) Hx of BACK PAIN, LUMBAR (ICD-724.2) ANXIETY DEPRESSION (ICD-300.4) ANEMIA (ICD-285.9) MONOCLONAL GAMMOPATHY (ICD-273.1)  Past Surgical History: Last updated: 01/31/2010 PACEMAKER, PERMANENT (ICD-V45.01) APPENDECTOMY, HX OF (ICD-V45.79)  Family History: Last updated: 20-Jan-2010 Mother died at age 33 of CHF Father died at age 54 of heart attack 34 Sibs: Heart disease--mother and brother Parkinson's disease--sister Colorectal cancer--daughter Pancreatic cancer--brother and sister Brain cancer--brother  Social History: Last updated: 06/13/2008 Widow/Widower Former Smoker--quit approx. 1989 Alcohol use-no Drug use-no Daily Caffeine Use  Risk Factors: Smoking Status: quit (01/31/2010)  Review of Systems      See HPI  Vital Signs:  Patient profile:   77 year old female Height:      63 inches Weight:      154.13 pounds BMI:     27.40 O2 Sat:      94 % on Room air Temp:     98.3 degrees F oral Pulse rate:   77 / minute BP sitting:   130 / 82  (left arm) Cuff size:   regular  Vitals Entered  By: Boone Master CNA/MA (May 27, 2010 2:31 PM)  O2 Flow:  Room air CC: prod cough with yellow mucus, wheezing, increased SOB x1week - denies f/c/s Is Patient Diabetic? No Comments Medications reviewed with patient Daytime contact number verified with patient. Boone Master CNA/MA  May 27, 2010 2:31 PM    Physical Exam  Additional Exam:  Gen. Well-nourished, in no distress ENT: clear nasal drianage    Neck: No JVD, thyroid not enlarged, no carotid bruits Lungs: No tachypnea, clear without rales, rhonchi or wheezes Cardiovascular: Rhythm regular, PMI not displaced,  heart sounds  normal, no murmurs or gallops, no peripheral edema, pulses normal in all 4 extremities. Abdomen: BS normal, abdomen soft and non-tender without masses or organomegaly, no hepatosplenomegaly. MS: No deformities, no cyanosis or clubbing   Neuro:  No focal sns   Skin:  no lesions    Impression & Recommendations:  Problem # 1:  URI (ICD-465.9) Z-Pack take as directed. w/ food Mucinex DM two times a day as needed cough/congestion  Zyrtec 10mg  at bedtime for 5 days  Saline nasal rinses as needed  Increase fluids and rest  Tylenol as needed  Please contact office for sooner follow up if symptoms do not improve or worsen  Her updated medication list for this problem includes:    Aspirin 81 Mg Tbec (Aspirin) .Marland Kitchen... 1 tablet by mouth once daily    Advil 200 Mg Tabs (Ibuprofen) .Marland Kitchen... As needed  Orders: Est. Patient Level III ([address]109)  Medications Added to Medication List This Visit: 1)  Simvastatin 40 Mg Tabs (Simvastatin) .... ***hold*** take 1/2  tablet by mouth once a day 2)  Zithromax Z-pak 250 Mg Tabs (Azithromycin) .... Take as directed.  Patient Instructions: 1)  Z-Pack take as directed. w/ food 2)  Mucinex DM two times a day as needed cough/congestion  3)  Zyrtec 10mg  at bedtime for 5 days  4)  Saline nasal rinses as needed  5)  Increase fluids and rest  6)  Tylenol as needed  7)  Please  contact office for sooner follow up if symptoms do not improve or worsen  Prescriptions: ZITHROMAX Z-PAK 250 MG TABS (AZITHROMYCIN) take as directed.  #1 x 0   Entered and Authorized by:   Rubye Oaks NP   Signed by:   Rubye Oaks NP on 05/27/2010   Method used:   Electronically to        [facility]  [phone]* (retail)       [address] [place], [place]  [address]       Ph: [phone] or [phone]       Fax: [phone]   RxID:   [id number]    Immunization History:  Pneumovax Immunization History:    Pneumovax:  historical (08/09/2009)

## 2010-06-12 NOTE — Cardiovascular Report (Signed)
Summary: TTM   TTM   Imported By: Roderic Ovens 05/07/2010 15:42:54  _____________________________________________________________________  External Attachment:    Type:   Image     Comment:   External Document

## 2010-06-19 ENCOUNTER — Ambulatory Visit: Payer: Self-pay | Admitting: Professional

## 2010-06-26 ENCOUNTER — Ambulatory Visit: Payer: Self-pay | Admitting: Professional

## 2010-07-02 NOTE — Cardiovascular Report (Signed)
Summary: TTM   TTM   Imported By: Roderic Ovens 06/24/2010 09:09:16  _____________________________________________________________________  External Attachment:    Type:   Image     Comment:   External Document

## 2010-07-03 ENCOUNTER — Ambulatory Visit (INDEPENDENT_AMBULATORY_CARE_PROVIDER_SITE_OTHER): Payer: 59 | Admitting: Professional

## 2010-07-03 DIAGNOSIS — F4323 Adjustment disorder with mixed anxiety and depressed mood: Secondary | ICD-10-CM

## 2010-07-21 ENCOUNTER — Ambulatory Visit (INDEPENDENT_AMBULATORY_CARE_PROVIDER_SITE_OTHER): Payer: 59 | Admitting: Professional

## 2010-07-21 DIAGNOSIS — F4323 Adjustment disorder with mixed anxiety and depressed mood: Secondary | ICD-10-CM

## 2010-07-22 LAB — CBC
HCT: 36.5 % (ref 36.0–46.0)
Platelets: 226 10*3/uL (ref 150–400)
RDW: 13.3 % (ref 11.5–15.5)
WBC: 6.1 10*3/uL (ref 4.0–10.5)

## 2010-07-28 ENCOUNTER — Ambulatory Visit (INDEPENDENT_AMBULATORY_CARE_PROVIDER_SITE_OTHER): Payer: 59 | Admitting: Professional

## 2010-07-28 DIAGNOSIS — F4323 Adjustment disorder with mixed anxiety and depressed mood: Secondary | ICD-10-CM

## 2010-07-28 LAB — COMPREHENSIVE METABOLIC PANEL
ALT: 26 U/L (ref 0–35)
Alkaline Phosphatase: 101 U/L (ref 39–117)
CO2: 24 mEq/L (ref 19–32)
Chloride: 105 mEq/L (ref 96–112)
GFR calc non Af Amer: >60 mL/min (ref >60)
Glucose, Bld: 181 mg/dL — ABNORMAL HIGH (ref 70–99)
Potassium: 5.2 mEq/L — ABNORMAL HIGH (ref 3.5–5.1)
Sodium: 139 mEq/L (ref 135–145)

## 2010-07-28 LAB — URINALYSIS, ROUTINE W REFLEX MICROSCOPIC
Bilirubin Urine: NEGATIVE
Glucose, UA: NEGATIVE mg/dL
Hgb urine dipstick: NEGATIVE
pH: 6 (ref 5.0–8.0)

## 2010-07-28 LAB — URINE MICROSCOPIC-ADD ON

## 2010-07-28 LAB — BASIC METABOLIC PANEL
CO2: 26 mEq/L (ref 19–32)
Calcium: 8.4 mg/dL (ref 8.4–10.5)
Chloride: 108 mEq/L (ref 96–112)
GFR calc Af Amer: >60 mL/min (ref >60)
Sodium: 141 mEq/L (ref 135–145)

## 2010-07-28 LAB — DIFFERENTIAL
Basophils Relative: 1 % (ref 0–1)
Basophils Relative: 2 % — ABNORMAL HIGH (ref 0–1)
Eosinophils Absolute: 0 10*3/uL (ref 0.0–0.7)
Eosinophils Relative: 0 % (ref 0–5)
Lymphs Abs: 0.3 10*3/uL — ABNORMAL LOW (ref 0.7–4.0)
Monocytes Absolute: 0.2 10*3/uL (ref 0.1–1.0)
Monocytes Relative: 1 % — ABNORMAL LOW (ref 3–12)
Neutro Abs: 11.8 10*3/uL — ABNORMAL HIGH (ref 1.7–7.7)
Neutrophils Relative %: 93 % — ABNORMAL HIGH (ref 43–77)

## 2010-07-28 LAB — CBC
Hemoglobin: 13.1 g/dL (ref 12.0–15.0)
Hemoglobin: 14.7 g/dL (ref 12.0–15.0)
MCHC: 34.1 g/dL (ref 30.0–36.0)
RBC: 4.15 MIL/uL (ref 3.87–5.11)
RBC: 4.55 MIL/uL (ref 3.87–5.11)
WBC: 17.1 10*3/uL — ABNORMAL HIGH (ref 4.0–10.5)

## 2010-07-28 LAB — CLOSTRIDIUM DIFFICILE EIA: C difficile Toxins A+B, EIA: NEGATIVE

## 2010-07-28 LAB — URINE CULTURE: Special Requests: NEGATIVE

## 2010-07-28 LAB — MAGNESIUM: Magnesium: 2 mg/dL (ref 1.5–2.5)

## 2010-07-28 LAB — PHOSPHORUS: Phosphorus: 5 mg/dL — ABNORMAL HIGH (ref 2.3–4.6)

## 2010-07-28 LAB — METHYLMALONIC ACID, SERUM: Methylmalonic Acid, Quantitative: 106 nmol/L (ref 87–318)

## 2010-07-31 LAB — COMPREHENSIVE METABOLIC PANEL
AST: 33 U/L (ref 0–37)
BUN: 6 mg/dL (ref 6–23)
CO2: 27 mEq/L (ref 19–32)
Calcium: 8.2 mg/dL — ABNORMAL LOW (ref 8.4–10.5)
Chloride: 110 mEq/L (ref 96–112)
Creatinine, Ser: 0.75 mg/dL (ref 0.4–1.2)
GFR calc Af Amer: >60 mL/min (ref >60)
GFR calc non Af Amer: >60 mL/min (ref >60)
Glucose, Bld: 112 mg/dL — ABNORMAL HIGH (ref 70–99)
Total Bilirubin: 0.2 mg/dL — ABNORMAL LOW (ref 0.3–1.2)

## 2010-07-31 LAB — CBC
HCT: 32.2 % — ABNORMAL LOW (ref 36.0–46.0)
Hemoglobin: 10.9 g/dL — ABNORMAL LOW (ref 12.0–15.0)
Hemoglobin: 11 g/dL — ABNORMAL LOW (ref 12.0–15.0)
MCHC: 33.5 g/dL (ref 30.0–36.0)
MCHC: 34.3 g/dL (ref 30.0–36.0)
MCV: 91.8 fL (ref 78.0–100.0)
RBC: 3.51 MIL/uL — ABNORMAL LOW (ref 3.87–5.11)
RBC: 3.52 MIL/uL — ABNORMAL LOW (ref 3.87–5.11)
RDW: 14.3 % (ref 11.5–15.5)
WBC: 5.2 10*3/uL (ref 4.0–10.5)

## 2010-07-31 LAB — DIFFERENTIAL
Basophils Absolute: 0 10*3/uL (ref 0.0–0.1)
Basophils Absolute: 0 10*3/uL (ref 0.0–0.1)
Basophils Relative: 1 % (ref 0–1)
Eosinophils Relative: 4 % (ref 0–5)
Lymphocytes Relative: 22 % (ref 12–46)
Lymphocytes Relative: 25 % (ref 12–46)
Lymphs Abs: 1.3 10*3/uL (ref 0.7–4.0)
Monocytes Absolute: 0.4 10*3/uL (ref 0.1–1.0)
Neutro Abs: 3.2 10*3/uL (ref 1.7–7.7)
Neutro Abs: 3.3 10*3/uL (ref 1.7–7.7)
Neutrophils Relative %: 63 % (ref 43–77)
Neutrophils Relative %: 66 % (ref 43–77)

## 2010-07-31 LAB — BASIC METABOLIC PANEL
BUN: 5 mg/dL — ABNORMAL LOW (ref 6–23)
Calcium: 8 mg/dL — ABNORMAL LOW (ref 8.4–10.5)
Calcium: 8.1 mg/dL — ABNORMAL LOW (ref 8.4–10.5)
Chloride: 107 mEq/L (ref 96–112)
Creatinine, Ser: 0.81 mg/dL (ref 0.4–1.2)
Creatinine, Ser: 0.83 mg/dL (ref 0.4–1.2)
GFR calc Af Amer: >60 mL/min (ref >60)
GFR calc Af Amer: >60 mL/min (ref >60)
GFR calc non Af Amer: >60 mL/min (ref >60)
Glucose, Bld: 103 mg/dL — ABNORMAL HIGH (ref 70–99)
Sodium: 138 mEq/L (ref 135–145)

## 2010-07-31 LAB — HEPATIC FUNCTION PANEL
ALT: 27 U/L (ref 0–35)
AST: 39 U/L — ABNORMAL HIGH (ref 0–37)
Albumin: 2.8 g/dL — ABNORMAL LOW (ref 3.5–5.2)
Bilirubin, Direct: 0.1 mg/dL (ref 0.0–0.3)

## 2010-07-31 LAB — CLOSTRIDIUM DIFFICILE EIA: C difficile Toxins A+B, EIA: NEGATIVE

## 2010-08-12 ENCOUNTER — Encounter: Payer: Self-pay | Admitting: Pulmonary Disease

## 2010-08-13 LAB — DIFFERENTIAL
Basophils Absolute: 0 10*3/uL (ref 0.0–0.1)
Eosinophils Relative: 1 % (ref 0–5)
Lymphocytes Relative: 8 % — ABNORMAL LOW (ref 12–46)
Monocytes Absolute: 0.3 10*3/uL (ref 0.1–1.0)

## 2010-08-13 LAB — BASIC METABOLIC PANEL
BUN: 22 mg/dL (ref 6–23)
Calcium: 9.1 mg/dL (ref 8.4–10.5)
Creatinine, Ser: 0.77 mg/dL (ref 0.4–1.2)
GFR calc Af Amer: >60 mL/min (ref >60)

## 2010-08-13 LAB — URINE MICROSCOPIC-ADD ON

## 2010-08-13 LAB — CBC
MCHC: 34.2 g/dL (ref 30.0–36.0)
Platelets: 209 10*3/uL (ref 150–400)
RBC: 4.32 MIL/uL (ref 3.87–5.11)
WBC: 11.4 10*3/uL — ABNORMAL HIGH (ref 4.0–10.5)

## 2010-08-13 LAB — URINALYSIS, ROUTINE W REFLEX MICROSCOPIC
Bilirubin Urine: NEGATIVE
Glucose, UA: NEGATIVE mg/dL
Ketones, ur: NEGATIVE mg/dL
Nitrite: NEGATIVE
pH: 6 (ref 5.0–8.0)

## 2010-08-14 ENCOUNTER — Ambulatory Visit (INDEPENDENT_AMBULATORY_CARE_PROVIDER_SITE_OTHER): Payer: Self-pay | Admitting: Pulmonary Disease

## 2010-08-14 ENCOUNTER — Other Ambulatory Visit (INDEPENDENT_AMBULATORY_CARE_PROVIDER_SITE_OTHER): Payer: Medicare Other

## 2010-08-14 ENCOUNTER — Other Ambulatory Visit: Payer: Self-pay | Admitting: Pulmonary Disease

## 2010-08-14 ENCOUNTER — Encounter: Payer: Self-pay | Admitting: Pulmonary Disease

## 2010-08-14 ENCOUNTER — Other Ambulatory Visit (INDEPENDENT_AMBULATORY_CARE_PROVIDER_SITE_OTHER): Payer: Medicare Other | Admitting: Pulmonary Disease

## 2010-08-14 DIAGNOSIS — D472 Monoclonal gammopathy: Secondary | ICD-10-CM

## 2010-08-14 DIAGNOSIS — E785 Hyperlipidemia, unspecified: Secondary | ICD-10-CM

## 2010-08-14 DIAGNOSIS — D649 Anemia, unspecified: Secondary | ICD-10-CM

## 2010-08-14 DIAGNOSIS — I319 Disease of pericardium, unspecified: Secondary | ICD-10-CM

## 2010-08-14 DIAGNOSIS — K219 Gastro-esophageal reflux disease without esophagitis: Secondary | ICD-10-CM

## 2010-08-14 DIAGNOSIS — M1990 Unspecified osteoarthritis, unspecified site: Secondary | ICD-10-CM

## 2010-08-14 DIAGNOSIS — K589 Irritable bowel syndrome without diarrhea: Secondary | ICD-10-CM

## 2010-08-14 DIAGNOSIS — E7800 Pure hypercholesterolemia, unspecified: Secondary | ICD-10-CM

## 2010-08-14 DIAGNOSIS — I495 Sick sinus syndrome: Secondary | ICD-10-CM

## 2010-08-14 DIAGNOSIS — F341 Dysthymic disorder: Secondary | ICD-10-CM

## 2010-08-14 LAB — CBC WITH DIFFERENTIAL/PLATELET
Basophils Absolute: 0.1 K/uL (ref 0.0–0.1)
Basophils Relative: 1.1 % (ref 0.0–3.0)
Eosinophils Absolute: 0.3 K/uL (ref 0.0–0.7)
Eosinophils Relative: 5.2 % — ABNORMAL HIGH (ref 0.0–5.0)
HCT: 38.4 % (ref 36.0–46.0)
Hemoglobin: 13.2 g/dL (ref 12.0–15.0)
Lymphocytes Relative: 35.4 % (ref 12.0–46.0)
Lymphs Abs: 2.1 K/uL (ref 0.7–4.0)
MCHC: 34.4 g/dL (ref 30.0–36.0)
MCV: 90.9 fl (ref 78.0–100.0)
Monocytes Absolute: 0.3 K/uL (ref 0.1–1.0)
Monocytes Relative: 4.7 % (ref 3.0–12.0)
Neutro Abs: 3.1 K/uL (ref 1.4–7.7)
Neutrophils Relative %: 53.6 % (ref 43.0–77.0)
Platelets: 216 K/uL (ref 150.0–400.0)
RBC: 4.22 Mil/uL (ref 3.87–5.11)
RDW: 13.4 % (ref 11.5–14.6)
WBC: 5.9 K/uL (ref 4.5–10.5)

## 2010-08-14 LAB — LIPID PANEL
Cholesterol: 277 mg/dL — ABNORMAL HIGH (ref 0–200)
HDL: 65.7 mg/dL (ref >39.00)
Triglycerides: 85 mg/dL (ref 0.0–149.0)

## 2010-08-14 LAB — BASIC METABOLIC PANEL WITH GFR
BUN: 25 mg/dL — ABNORMAL HIGH (ref 6–23)
CO2: 30 meq/L (ref 19–32)
Calcium: 9.3 mg/dL (ref 8.4–10.5)
Chloride: 104 meq/L (ref 96–112)
Creatinine, Ser: 0.9 mg/dL (ref 0.4–1.2)
GFR: 68.45 mL/min
Glucose, Bld: 91 mg/dL (ref 70–99)
Potassium: 4.4 meq/L (ref 3.5–5.1)
Sodium: 142 meq/L (ref 135–145)

## 2010-08-14 LAB — HEPATIC FUNCTION PANEL: Albumin: 3.6 g/dL (ref 3.5–5.2)

## 2010-08-14 LAB — TSH: TSH: 0.81 u[IU]/mL (ref 0.35–5.50)

## 2010-08-14 MED ORDER — LORAZEPAM 1 MG PO TABS: ORAL_TABLET | ORAL | Status: DC

## 2010-08-14 MED ORDER — SERTRALINE HCL 50 MG PO TABS: 100.0000 mg | ORAL_TABLET | Freq: Every day | ORAL | Status: DC

## 2010-08-14 NOTE — Patient Instructions (Signed)
Today we updated your med list...    We refilled your Ativan & Zoloft prescriptions...    You may incr the Ativan up to 1 tab three times daily as needed...  We also did your follow up FASTING blood work today...    Please call the PHONE TREE in a few days for your results...    Dial [phone] and when prompted enter your pt number followed by the # symbol...    Your pt number is  [phone]  Call for any problems... Let's plan a follow up visit in another 6 months.Marland KitchenMarland Kitchen

## 2010-08-14 NOTE — Progress Notes (Signed)
Subjective:    Patient ID: [name], female    DOB: December 15, 1928, 77 y.o.   MRN: [id number]  HPI 77 y/o WF here for a follow up visit... she has multiple medical problems including:  SSS- s/p pacer insertion;  Hx pericarditis;  Hyperchol;  GERD/ Divertics/ IBS;  DJD/ LBP;  Anxiety/ Depression;  Anemia/ Monoclonal Gammopathy/ Borderline B12 level...  ~  January 31, 2010:  2d hx left ear pain- went to audiologist & sent here for ear infection... min drainage, no fever/ chills/ sweats, no adenopathy... exam shows some exud & inflammed EAC> we discussed Rx w/ Cortisporin Otic + Augmentin orally... she has bilat hearing aides & some further decr hearing noted... she will f/u w/ DrCrossley as needed.  ~  August 14, 2910:  6 month ROV & doing satis she says> still under alot of stress w/ sister's illness etc;  She has Ativan to use Prn but only taking it Qhs & we discussed this...    Hearing Loss>  She has new hearing aides...    SSS>  Followed by DrBB> now DrAllred w/ SSS, palpit, s/p pacer (last generator change 2000);  Last seen 10/11 & stable...    Hx pericarditis>  This occurred in 2001;  Echo in 2004 showed prob patent foramen w/ left to right interatrial shunt by doppler> DrBrodie is aware & following...    Chol>  She has been on Simva20 + FishOil w/ decent numbers, but she stopped on her own- wants to check FLP off meds; TChol 277, TG 85, HDL 66, LDL 185;  REC to restart Rx.    GI>  Uses OTC PPI as needed;  Last colonoscopy 2002 by DrGessner (neg x divertics) & she refuses repeat procedure despite daugh hx colon ca;  She tells me she has appt w/ DrDBrodie soon for incr IBS symptoms & will discuss colonoscopy vs stool cards at that time.    DJD>  She has mild-mod diffuse DJD & uses OTC meds prn...    MGUS>  She has monoclonal IgG kappa paraprot on SPE/IEP 12/10 w/ norm quant immunoglobulins;  B12 was low-normal range at 235;  Due for f/u labs> pending.   Past Medical History  Diagnosis Date    . Hearing loss   . Allergic rhinitis   . Sick sinus syndrome   . Presence of permanent cardiac pacemaker   . History of pericarditis   . Hypercholesteremia   . GERD (gastroesophageal reflux disease)   . IBS (irritable bowel syndrome)   . Diverticulosis of colon   . DJD (degenerative joint disease)   . Lumbar back pain   . Anxiety and depression   . Anemia   . Monoclonal gammopathy     Past Surgical History  Procedure Date  . Pacemaker placement   . Appendectomy    SEE MED LIST> REVIEWED   No Known Allergies   Review of Systems        See HPI - all other systems are neg except as noted...       The patient complains of decreased hearing.  The patient denies anorexia, fever, weight loss, weight gain, vision loss, hoarseness, chest pain, syncope, dyspnea on exertion, peripheral edema, prolonged cough, headaches, hemoptysis, abdominal pain, melena, hematochezia, severe indigestion/heartburn, hematuria, incontinence, muscle weakness, suspicious skin lesions, transient blindness, difficulty walking, depression, unusual weight change, abnormal bleeding, enlarged lymph nodes, and angioedema.     Objective:   Physical Exam     WD, WN, 77 y/o  WF in NAD... GENERAL:  Alert & oriented; pleasant & cooperative... HEENT:  [place]/AT, EOM-wnl, PERRLA, EACs- hearing aides bilat & left EAC exud/ sl red/ inflammed, NOSE-clear, THROAT-clear & wnl. NECK:  Supple w/ full ROM; no JVD; normal carotid impulses w/o bruits; no thyromegaly or nodules palpated; no lymphadenopathy. CHEST:  Clear to P & A; without wheezes/ rales/ or rhonchi. HEART:  Regular Rhythm; without murmurs/ rubs/ or gallops. ABDOMEN:  Soft & nontender; normal bowel sounds; no organomegaly or masses detected. EXT: without deformities, mild arthritic changes; no varicose veins/ +venous insuffic/ no edema. NEURO:  CN's intact;  no focal neuro deficits... DERM:  No lesions noted; no rash etc...   Assessment & Plan:

## 2010-08-15 ENCOUNTER — Encounter: Payer: Self-pay | Admitting: Pulmonary Disease

## 2010-08-15 MED ORDER — SIMVASTATIN 40 MG PO TABS: 20.0000 mg | ORAL_TABLET | Freq: Every day | ORAL | Status: DC

## 2010-08-15 NOTE — Assessment & Plan Note (Signed)
She denies CP, palpit, dizzy, syncope, etc...  Cardiac followed by DrAllred now w/ pacer & ?PFO per Echo in 2004.

## 2010-08-15 NOTE — Assessment & Plan Note (Signed)
She stopped the simva20 on her own & f/u FLP shows TChol 277, LDL 185;  REC to restart the Simva20 Qhs + low chol/ low fat diet.Marland KitchenMarland Kitchen

## 2010-08-15 NOTE — Assessment & Plan Note (Signed)
Clinically stable & Hg= 13.2, normal... We are awaiting result of today's SPE/ IEP & will follow up w/ pt.Marland KitchenMarland Kitchen

## 2010-08-15 NOTE — Assessment & Plan Note (Signed)
She is followed by DrDBrodie for GI> c/o incr IBS symptoms recently & has a follow up appt soon;  She will discuss f/u colonoscopy w/ DrBrodie.Marland KitchenMarland Kitchen

## 2010-08-15 NOTE — Assessment & Plan Note (Signed)
She is stable on Zoloft & Ativan;  I have rec that she incr the Lorazepam beyond the one tab Qhs she is taking due to her severe anxiety & she will try 1/2 tab bid in addition.Marland KitchenMarland Kitchen

## 2010-08-15 NOTE — Assessment & Plan Note (Signed)
She has mild to mod generalized DJD & uses Glucosamine, Advil, etc..Marland Kitchen

## 2010-08-18 LAB — METHYLMALONIC ACID, SERUM: Methylmalonic Acid, Quantitative: 133 nmol/L (ref 87–318)

## 2010-08-19 ENCOUNTER — Telehealth: Payer: Self-pay | Admitting: *Deleted

## 2010-08-19 DIAGNOSIS — E538 Deficiency of other specified B group vitamins: Secondary | ICD-10-CM

## 2010-08-19 DIAGNOSIS — D649 Anemia, unspecified: Secondary | ICD-10-CM

## 2010-08-19 MED ORDER — SIMVASTATIN 20 MG PO TABS: 20.0000 mg | ORAL_TABLET | Freq: Every day | ORAL | Status: DC

## 2010-08-19 NOTE — Telephone Encounter (Signed)
Per SN----phone tree note was recorded--t chol was 279, ldl 185 off of her simvastatin and she will need to restart for sure.. These numbers are terrible on diet alone. recs to start on simvastatin 20mg    #30 or #90  1 qhs with prn refills.  Other labs are all ok--pt will need to return to the lab for b12 and spe/iep.   Called and spoke with pt--

## 2010-08-19 NOTE — Telephone Encounter (Signed)
Called and spoke with pt and she is aware to come to the lab one day soon for her labs---b12 and spe with reflex.  Pt is aware that we will call her with these results.  She is also aware of the simvastatin 20 sent into her pharmacy

## 2010-08-21 ENCOUNTER — Other Ambulatory Visit: Payer: Self-pay | Admitting: Pulmonary Disease

## 2010-08-21 ENCOUNTER — Other Ambulatory Visit (INDEPENDENT_AMBULATORY_CARE_PROVIDER_SITE_OTHER): Payer: Medicare Other

## 2010-08-21 ENCOUNTER — Encounter: Payer: Self-pay | Admitting: Internal Medicine

## 2010-08-21 ENCOUNTER — Ambulatory Visit (INDEPENDENT_AMBULATORY_CARE_PROVIDER_SITE_OTHER): Payer: Medicare Other | Admitting: Internal Medicine

## 2010-08-21 VITALS — BP 140/68 | HR 72 | Ht 63.0 in | Wt 153.2 lb

## 2010-08-21 DIAGNOSIS — E538 Deficiency of other specified B group vitamins: Secondary | ICD-10-CM

## 2010-08-21 DIAGNOSIS — Z800 Family history of malignant neoplasm of digestive organs: Secondary | ICD-10-CM

## 2010-08-21 DIAGNOSIS — D649 Anemia, unspecified: Secondary | ICD-10-CM

## 2010-08-21 DIAGNOSIS — K219 Gastro-esophageal reflux disease without esophagitis: Secondary | ICD-10-CM

## 2010-08-21 LAB — VITAMIN B12: Vitamin B-12: 420 pg/mL (ref 211–911)

## 2010-08-21 MED ORDER — OMEPRAZOLE 20 MG PO CPDR: 20.0000 mg | DELAYED_RELEASE_CAPSULE | Freq: Every day | ORAL | Status: DC

## 2010-08-21 MED ORDER — HYDROCORTISONE ACE-PRAMOXINE 2.5-1 % RE CREA: TOPICAL_CREAM | RECTAL | Status: DC

## 2010-08-21 MED ORDER — PEG-KCL-NACL-NASULF-NA ASC-C 100 G PO SOLR: 1.0000 | Freq: Once | ORAL | Status: AC

## 2010-08-21 NOTE — Patient Instructions (Signed)
You have been scheduled for an endoscopy and colonoscopy. Please follow written instructions given to you at your visit today. Please pick up your presciption for Moviprep at your pharmacy. We have given you samples of Prilosec to take once daily. We have given you samples of Analpram to apply to your rectum 2-3 times daily as needed.

## 2010-08-21 NOTE — Progress Notes (Signed)
[name] 08-12-1928 MRN [id number]   History of Present Illness:  This is a 77 year old white female with diarrhea irritable bowel syndrome and a family history of colon cancer in her 18 year old daughter 30 years ago. Her last colonoscopy in December 2002 showed mild diverticulosis. She has been having gastroesophageal reflux after eating certain foods; specifically tomato soup. She has a strong family history of pancreatic cancer in 2 of her siblings. An upper abdominal ultrasound in January 2008 was negative. Her weight has been stable or slightly increased. She has a history of pericarditis in 2004 and sick sinus syndrome.   Past Medical History  Diagnosis Date  . Hearing loss   . Allergic rhinitis   . Sick sinus syndrome   . Presence of permanent cardiac pacemaker   . History of pericarditis   . Hypercholesteremia   . GERD (gastroesophageal reflux disease)   . IBS (irritable bowel syndrome)   . Diverticulosis of colon   . DJD (degenerative joint disease)   . Lumbar back pain   . Anxiety and depression   . Anemia   . Monoclonal gammopathy    Past Surgical History  Procedure Date  . Pacemaker placement   . Appendectomy   . Dilation and curettage of uterus 03/24/10    reports that she quit smoking about 23 years ago. Her smoking use included Cigarettes. She quit after 10 years of use. She has never used smokeless tobacco. She reports that she drinks alcohol. She reports that she does not use illicit drugs. family history includes Brain cancer in her brother; Clotting disorder in her sister; Colon cancer in her daughter; Crohn's disease in her sister; Diabetes in her sister; Heart attack in her father; Heart disease in her brother; Heart failure in her mother; Lung cancer in her sister; Pancreatic cancer in her brother and sister; Parkinsonism in her sister; and Rectal cancer in her daughter. No Known Allergies      Review of Systems: Denies dysphagia or odynophagia.  Occasional chest pain related to reflux. No shortness of breath. Denies recent episodes of diarrhea.  The remainder of the 10  point ROS is negative except as outlined in H&P   Physical Exam: General appearance  Well developed, in no distress. Eyes- non icteric. HEENT nontraumatic, normocephalic. Mouth no lesions, tongue papillated, no cheilosis. Neck supple without adenopathy, thyroid not enlarged, no carotid bruits, no JVD. Lungs Clear to auscultation bilaterally. Cor normal S1 normal S2, regular rhythm , no murmur,  quiet precordium. Abdomen soft, nontender abdomen with normal active bowel sounds. No distention. Liver edge at costal margin. No tenderness. Rectal: Soft Hemoccult negative stool. Extremities no pedal edema. Skin no lesions. Neurological alert and oriented x 3. Psychological normal mood and affect.  Assessment and Plan:  Problems #1 gastroesophageal reflux. Patient has been given samples of Prilosec 20 mg for patient to take daily. She needs to follow antireflux measures and avoid acidic foods. We will schedule her for an upper endoscopy.  Problem #2 family history of colon cancer. She is due for a recall colonoscopy. Her last exam was in February 2002. We will schedule that. I have given her samples of Analpram cream to be used when necessary.  Problem #3 family history pancreatic cancer. Patient had a normal ultrasound of the abdomen 4 years ago. There are no symptoms suggestive of pancreatic cancer.   08/21/2010 Lina Sar

## 2010-08-25 LAB — PROTEIN ELECTROPHORESIS, SERUM, WITH REFLEX
Alpha-2-Globulin: 11.8 % (ref 7.1–11.8)
Total Protein, Serum Electrophoresis: 7 g/dL (ref 6.0–8.3)

## 2010-08-27 ENCOUNTER — Ambulatory Visit (INDEPENDENT_AMBULATORY_CARE_PROVIDER_SITE_OTHER): Payer: Medicare Other | Admitting: Pulmonary Disease

## 2010-08-27 ENCOUNTER — Ambulatory Visit: Payer: Medicare Other | Admitting: Pulmonary Disease

## 2010-08-27 DIAGNOSIS — Z0000 Encounter for general adult medical examination without abnormal findings: Secondary | ICD-10-CM

## 2010-08-29 LAB — TB SKIN TEST
Induration: NEGATIVE
TB Skin Test: NEGATIVE mm

## 2010-08-29 NOTE — Progress Notes (Signed)
Pt ppd read per Randell Loop.

## 2010-09-02 ENCOUNTER — Encounter: Payer: Self-pay | Admitting: Internal Medicine

## 2010-09-02 DIAGNOSIS — I495 Sick sinus syndrome: Secondary | ICD-10-CM

## 2010-09-03 ENCOUNTER — Ambulatory Visit (AMBULATORY_SURGERY_CENTER): Payer: Medicare Other | Admitting: Internal Medicine

## 2010-09-03 ENCOUNTER — Encounter: Payer: Self-pay | Admitting: Internal Medicine

## 2010-09-03 DIAGNOSIS — Z1211 Encounter for screening for malignant neoplasm of colon: Secondary | ICD-10-CM

## 2010-09-03 DIAGNOSIS — K219 Gastro-esophageal reflux disease without esophagitis: Secondary | ICD-10-CM

## 2010-09-03 DIAGNOSIS — K2990 Gastroduodenitis, unspecified, without bleeding: Secondary | ICD-10-CM

## 2010-09-03 DIAGNOSIS — K2940 Chronic atrophic gastritis without bleeding: Secondary | ICD-10-CM

## 2010-09-03 DIAGNOSIS — Z800 Family history of malignant neoplasm of digestive organs: Secondary | ICD-10-CM

## 2010-09-03 DIAGNOSIS — K5730 Diverticulosis of large intestine without perforation or abscess without bleeding: Secondary | ICD-10-CM

## 2010-09-03 DIAGNOSIS — D131 Benign neoplasm of stomach: Secondary | ICD-10-CM

## 2010-09-03 MED ORDER — SODIUM CHLORIDE 0.9 % IV SOLN: 500.0000 mL | INTRAVENOUS | Status: DC

## 2010-09-03 NOTE — Patient Instructions (Signed)
Findings:  Colonoscopy - Diverticulosis                  EGD - Hiatal Hernia, Esophagitis, Mild Gastritis  Recommendations:  High Fiber Diet                                   Anti-reflux regimen

## 2010-09-04 ENCOUNTER — Telehealth: Payer: Self-pay | Admitting: Internal Medicine

## 2010-09-04 ENCOUNTER — Telehealth: Payer: Self-pay | Admitting: *Deleted

## 2010-09-04 NOTE — Telephone Encounter (Signed)
Called several times and got fast busy signal [phone]. Same number on the admitting area as the contact list

## 2010-09-04 NOTE — Telephone Encounter (Signed)
Patient wanted to review how to take Prilosec again. Reviewed with patient as per 08/21/10 OV note.(daily)

## 2010-09-11 ENCOUNTER — Encounter: Payer: Self-pay | Admitting: Internal Medicine

## 2010-09-22 ENCOUNTER — Telehealth: Payer: Self-pay | Admitting: Internal Medicine

## 2010-09-22 NOTE — Telephone Encounter (Signed)
Patient is scheduled to see Dr Juanda Chance for 09/24/10 10:15

## 2010-09-23 NOTE — Discharge Summary (Signed)
[name], [name]               ACCOUNT NO.:  [id number]   MEDICAL RECORD NO.:  [id number]          PATIENT TYPE:  OIB   LOCATION:  2899                         FACILITY:  [facility]   PHYSICIAN:  Bruce R. Juanda Chance, MD, FACCDATE OF BIRTH:  February 27, 1929   DATE OF ADMISSION:  03/09/2008  DATE OF DISCHARGE:  03/09/2008                               DISCHARGE SUMMARY   This patient has no known drug allergies.   FINAL DIAGNOSES:  1. Sick sinus syndrome, status post implant of a St. Jude Synchrony II      in 1996.  2. Pacemaker at elective replacement indicator.  3. Successful change-out with implant of a St. Jude Zephyr XL DR dual-      chamber pacemaker by Dr. Charlies Constable.  The patient has had no post-      procedural complications.  No hematoma.  Discharging the same day.      She is asked to keep her incision dry for the next 7 days and      sponge bathe until Friday, March 16, 2008.  On the morning of      Saturday, October 31, she is to remove the bandage leaving her      incision open to the air.   BRIEF HISTORY:  [name] is 77 years old.  She has a telephone  check, which showed her device which was implanted in 1996 is at  elective replacement indication.  It was a Engineer, structural II.  The  device was implanted for sick sinus syndrome and pronounced vasovagal  syncopal episodes.  The patient will present on March 09, 2008, at  first convenient elective time.  Her past medical history also includes  a history of pericarditis, dyslipidemia, and anxiety.   HOSPITAL COURSE:  The patient presents on March 09, 2008.  Her  existing pacemaker was explanted with implant of the St. Jude Zephyr  dual-chamber device by Dr. Juanda Chance, discharging the same day.   DISCHARGE MEDICATIONS:  1. Keflex 250 mg 1 tab one-half hour before breakfast, lunch, dinner,      bedtime for the next 5 days.  2. Enteric-coated aspirin 81 mg daily.  3. Multivitamin daily.  4. Zoloft 100 mg daily.  5. Simvastatin 40 mg daily at bedtime.  6. Ativan 1 mg as needed.   She follows up with [facility], [address]  1. Pacer Clinic on Wednesday, March 21, 2008, at 9:20.  2. She sees Dr. Juanda Chance on Monday, April 02, 2008, at noon.   Laboratories drawn on March 09, 2008:  White cells 7.6, hemoglobin  12.8, hematocrit 38, platelets of 214, protime 13.4, INR is 1, sodium  138, potassium 3.7, chloride 105, bicarbonate 28, glucose 101, BUN is  15, and creatinine 0.76.      Maple Mirza, PA      Bruce R. Juanda Chance, MD, [facility]  Electronically Signed    GM/MEDQ  D:  03/09/2008  T:  03/10/2008  Job:  [id number]   cc:   Lonzo Cloud. Kriste Basque, MD  Everardo Beals Juanda Chance, MD, [facility]

## 2010-09-23 NOTE — Cardiovascular Report (Signed)
NAMEADISYNN, SULEIMAN               ACCOUNT NO.:  [id number]   MEDICAL RECORD NO.:  [id number]          PATIENT TYPE:  OIB   LOCATION:  2899                         FACILITY:  [facility]   PHYSICIAN:  Bruce R. Juanda Chance, MD, FACCDATE OF BIRTH:  [birth year]   DATE OF PROCEDURE:  DATE OF DISCHARGE:  03/09/2008                            CARDIAC CATHETERIZATION   CLINICAL HISTORY:  Ms. Taussig is 77 years old, had Paragon II pacemaker  implanted in 1996 for a sick sinus syndrome.  She recently reached ERI  and was brought in for generator change.  She also has hyperlipidemia  and a history of pericarditis.   PROCEDURE:  Explantation of the old Synchrony II dual-mode, dual-pacing,  dual-sensing generator (model number 2022, serial number [id number], date of  implant Sep 17, 1994), inspection of the old atrial lead (model number  1188T/46 cm, serial number [id number], date of implant Sep 17, 1994) and  ventricular lead (model number 1236T/52 cm, serial number [id number], date  of implant Sep 17, 1994) and implantation of a new Zephyr dual-mode, dual-  pacing, dual-sensing generator (model number [id number], serial number  [id number]).   ANESTHESIA:  A 1% local Xylocaine.   ESTIMATED BLOOD LOSS:  Less than 10 mL.   COMPLICATIONS:  None.   PROCEDURE:  The procedure was performed in laboratory room #2.  The  right anterior chest was prepped and draped in usual fashion.  The skin  and subcutaneous tissue were anesthetized with 1% local Xylocaine.  The  incision was made over the pacemaker pocket, just below the previous  incision.  This was extended to the pacemaker pocket and the pocket was  opened and the generator was removed.  The leads were interrogated with  pacing parameters as described below.  The pocket was irrigated with  sterile kanamycin solution.  The leads were attached to the new  generator implanted and the generator implanted into the pocket.  The  subcutaneous tissue was closed with running 2-0  Vicryl.  The skin was  closed with running 4-0 Vicryl.   PACING PARAMETERS:  Atrial lead:  P wave 2.2 mV.  Threshold 3.2 mV, and  pulse width of 0.5.  Impedance 603 ohms.   Ventricular leads:  R wave 4.5 mV.   Minimum threshold capture 0.8 and a pulse width of 0.5, and impedance  547 ohms.   The patient tolerated the procedure well and left the laboratory in  satisfactory condition.      Bruce Elvera Lennox Juanda Chance, MD, [facility]  Electronically Signed     BRB/MEDQ  D:  03/09/2008  T:  03/09/2008  Job:  [id number]   cc:   Lonzo Cloud. Kriste Basque, MD

## 2010-09-23 NOTE — Assessment & Plan Note (Signed)
[facility] HEALTHCARE                            CARDIOLOGY OFFICE NOTE   NAME:[name], [name]                      MRN:          [id number]  DATE:05/23/2008                            DOB:          17-Mar-1929    PRIMARY CARE PHYSICIAN:  Lonzo Cloud. Kriste Basque, MD   CLINICAL HISTORY:  Ms. [name] is 77 years old and returns for followup  visit after her recent Myoview scan.  She saw Dr. Eden Emms a few weeks ago  with chest pain and she had a Myoview scan on May 14, 2008, which  showed no evidence of ischemia.  She did have quite a bit of chest pain  with the adenosine scan and asked not to have this again.  She has had  no recurrence of her pain.  Dr. Eden Emms started her on Prevacid at that  time.   She also has sick sinus syndrome and had a Zephyr DE generator change in  October 2009 for end of life of her Paragon 2 pacemaker.  She has had no  palpitations or shortness of breath.   Her past medical history is significant for hyperlipidemia and a history  of pericarditis.   Her current medications include aspirin, glucosamine, calcium, Zoloft,  simvastatin 40 mg daily, and Prevacid.   On examination, the blood pressure was 125/68 and a pulse 67 and  regular.  There was no venous distention.  The carotid pulses were full  without bruits.  Chest was clear.  Cardiac rhythm was regular.  I could  hear no murmurs or gallops.  Abdomen was soft without organomegaly.  Peripheral pulses were full.  There was no peripheral edema.   We interrogated her pacemaker and she has a chronic high threshold on  the atrial lead of 2.5.  She was pacing both chambers less than 1% of  the time.   IMPRESSION:  1. Recent episode of chest pain with negative Myoview scan - probably      reflux symptoms.  2. Status post recent generator change with a new St. Jude Zephyr dual-      mode, dual-pacing, dual-sensing pacemaker implanted for sick sinus      syndrome.  3. History of  pericarditis and hyperlipidemia.  4. Chronic obstructive pulmonary disease by chest x-ray.   RECOMMENDATIONS:  I think, Ms. Pincock is doing well.  I told her she  could stop the Prevacid after a month and only resume it if she had  recurrent symptoms.  We will plan to see her back in October, which will  be the year anniversary of her generator change.     Bruce Elvera Lennox Juanda Chance, MD, [facility]  Electronically Signed    BRB/MedQ  DD: 05/23/2008  DT: 05/24/2008  Job #: [id number]

## 2010-09-23 NOTE — Assessment & Plan Note (Signed)
[facility] HEALTHCARE                            CARDIOLOGY OFFICE NOTE   NAME:[name], CLAUDIA GREENLEY                      MRN:          [id number]  DATE:01/19/2007                            DOB:          [birth year]    PRIMARY CARE PHYSICIAN:  Lonzo Cloud. Kriste Basque, MD   CLINICAL HISTORY:  Shady Padron is 77 years old and has sick sinus  syndrome and has a Synchrony DDD pacemaker implanted in 1996.  She has  done quite well since that time and has had no recent symptoms of chest  pain, shortness of breath or palpitations.   PAST MEDICAL HISTORY:  Significant for hyperlipidemia and pericarditis.   CURRENT MEDICATIONS:  Zocor, Zoloft, aspirin, glucosamine and  multivitamin.   She had a recent lipid profile, which showed an LDL of 127.   On examination today, blood pressure is 131/71, pulse 69 and regular.  There was no venous distention.  The carotid pulses were full without  bruits.  CHEST:  Clear.  Heart rhythm was regular.  I heard no murmurs or gallops.  ABDOMEN:  Soft, normal bowel sounds.  There was no hepatosplenomegaly.  Peripheral pulses were full and there was no peripheral edema.   Baseline electrocardiogram showed sinus rhythm overriding her pacemaker  and was normal.   IMPRESSION:  1. Sick sinus syndrome, status post implantation of a Synchrony DDD      pacemaker in 1996.  2. History of pericarditis.  3. Hyperlipidemia.   RECOMMENDATIONS:  I think Ms. Rider is doing well.  She is having some  arthritic symptoms and asked about taking Advil, and I gave her a  prescription for Voltaren 75 mg a day.  We will have Dr. Kriste Basque decide  about follow-up care after he sees her in the next follow-up visit.  Will interrogate her pacemaker today and plan to see her back in a year.     Bruce Elvera Lennox Juanda Chance, MD, [facility]  Electronically Signed    BRB/MedQ  DD: 01/19/2007  DT: 01/20/2007  Job #: [id number]

## 2010-09-23 NOTE — Assessment & Plan Note (Signed)
[facility] HEALTHCARE                            CARDIOLOGY OFFICE NOTE   NAME:[name], [name]                      MRN:          [id number]  DATE:05/08/2008                            DOB:          10-18-1928    HISTORY OF PRESENT ILLNESS:  Mr. [name] is a long-term patient of Dr.  Juanda Chance.  I was asked to see her as an add on by nurse [name] for chest  pain.   The patient has no previous history of coronary artery disease.  However, her last Myoview was in 2004.  She has a history of vagal  reaction with complete heart block, sick sinus syndrome, and required a  pacemaker implantation.  She had a revision of her pacemaker few months  ago, 3 weeks ago she had an episode of pain in her chest.  It sounded  more like esophagitis.  She had epigastric pain, which radiated  vertically up towards her throat.  She then had explosive diarrhea.  Her  nausea slowly subsided over the course of the day.  Subsequently,  yesterday she had epigastric pain, which then spread up into her chest.  There was no associated nausea or diarrhea at this time.  The pain  slowly resolved over the course of a few hours.   She is currently asymptomatic.  Her coronary risk factors include  positive family history and hypercholesterolemia.   She has seen Dr. Lina [name] in the past.  Looking back through the  records, she has a history of GERD and has had some over-the-counter  medicine in the past.  She also has been seen for diverticulosis and  irritable bowel syndrome.   She has not had any history of GI bleeding.  I do not have her records  with me and I do not know when her last endoscopy was, but it is not  been recent.   REVIEW OF SYSTEMS:  Otherwise negative.   MEDICATIONS:  She is on an aspirin a day, glucosamine, calcium, Zoloft  100 a day, and simvastatin 40 a day.   ALLERGIES:  She has no known allergies.  She is a widower.  She has  Boxer named [name] at home.  She has  family in the area including a  daughter who looks after her.  She is still very active and drove  herself here.  She is nonsmoker and nondrinker.   PHYSICAL EXAMINATION:  GENERAL:  Remarkable for a well preserved white  female in no distress.  Affect is appropriate.  VITAL SIGNS:  Blood pressure 140/72, pulse 60 and regular, respiratory  rate 14, and afebrile.  HEENT:  Unremarkable.  NECK:  Carotids are normal without bruit.  No lymphadenopathy,  thyromegaly, or JVP elevation.  LUNGS:  Clear.  Good diaphragmatic motion.  No wheezing.  HEART:  S1 and S2 with a soft systolic murmur.  PMI normal.  Pacer under  right clavicle in good position.  No rub, PMI normal.  ABDOMEN:  Benign.  Bowel sounds positive.  No AAA, no tenderness, no bruit, and no  epigastric pain.  No hepatosplenomegaly, no  hepatojugular reflux, and no  tenderness.  EXTREMITIES:  Distal pulses are intact.  No edema.  NEUROLOGIC:  Nonfocal.  SKIN:  Warm and dry.  No muscular weakness.   EKG is normal and non pacing.   IMPRESSION:  1. Atypical chest pain.  Followup adenosine Myoview.  2. Pacemaker functioning normally.  Position good under the right      clavicle.  No evidence of diaphragmatic pacing.  3. Hypercholesterolemia.  Continue simvastatin.  Lipid and liver      profile in 6 months  4. Gastroesophageal reflux disease and diverticulosis.  I think her      pain is coming more from her esophagus or gastroesophageal reflux      disease.  I told her to try over-the-counter Pepcid.  She will      follow up with Dr. Charlies [name] in about 2 weeks.  If her Myoview      is nonischemic.  She may need further follow up with [name] and      possible endoscopy.  Overall, she is currently asymptomatic and her      pain did not sound anginal in nature with no history of coronary      artery disease.     [name]. Tamara Emms, MD, [facility]  Electronically Signed    PCN/MedQ  DD: 05/08/2008  DT: 05/09/2008  Job #: [id number]

## 2010-09-23 NOTE — Assessment & Plan Note (Signed)
[facility] HEALTHCARE                            CARDIOLOGY OFFICE NOTE   NAME:[name], Tamara EISCHEID                      MRN:          [id number]  DATE:04/02/2008                            DOB:          18-Mar-1929    PRIMARY CARE PHYSICIAN:  Lonzo Cloud. Kriste Basque, MD   CLINICAL HISTORY:  [name] is 77 years old who returned for  followup after recent generator change.  She had originally had a St.  Jude synchrony implanted in 1996 for sick sinus syndrome and recently  reached ERA and she had a new St. Jude Zephyr DDD pacemaker and  generator implanted.  She has the high chronic threshold in atrial lead  and we did not change that lead.   She has done fine since that time with no symptoms of chest pain,  shortness of breath, or palpitations.   Her past medical history is significant for hyperlipidemia and  pericarditis.   Her current medications include aspirin, glucosamine, multivitamins,  Zoloft, and simvastatin.   On examination today, blood pressure is 140/72 and pulse is 60 and  regular.  There was no venous tension.  The carotid pulses were full  without bruits.  Chest was clear.  Cardiac rhythm was regular.  I could  hear no murmurs or gallops.  Pacer site was well healed.  There was some  mild puffiness, but no erythema.  Abdomen is soft.  Normal bowel sounds.  There is no hepatosplenomegaly.  Peripheral pulse were full.  There is  no peripheral edema.   Her interrogation on the pacemaker clinic recently showed that the  threshold on the atrial lead was 3.5.  She is not pacer dependent.   IMPRESSION:  1. Sick sinus syndrome.  2. Status post recent generator change with removal of the old Paragon      synchrony tube pacemaker insertion of a St. Jude Zephyr pacemaker.  3. History of pericarditis.  4. Hyperlipidemia.  5. Chronic obstructive pulmonary disease by chest x-ray.   RECOMMENDATIONS:  I think [name] is doing quite well.  She has got  her x-ray and that was interpreted as showing emphysema.  She is little  concerned about this, but I reassured her that since she is not smoking,  I do not think that would cause her any problem.  She will follow up  with Dr. Kriste Basque and I will see her back in followup in a year.    Bruce Elvera Lennox Juanda Chance, MD, [facility]  Electronically Signed   BRB/MedQ  DD: 04/02/2008  DT: 04/03/2008  Job #: [id number]

## 2010-09-23 NOTE — Assessment & Plan Note (Signed)
[facility] HEALTHCARE                            CARDIOLOGY OFFICE NOTE   NAME:Bermingham, LAROSE BATRES                      MRN:          [id number]  DATE:01/10/2008                            DOB:          [birth year]    PRIMARY CARE PHYSICIAN:  Lonzo Cloud. Kriste Basque, MD   CLINICAL HISTORY:  Odaliz Mcqueary is a 77 year old and returned for  followup management of her pacemaker and sick sinus syndrome.  She had a  synchrony DDD pacemaker implanted in 1996 for sick sinus syndrome.  She  done quite well since that time.  She had occasional palpitations.  No  chest pain or shortness of breath.   Her past medical history is known for hyperlipidemia and a history of  prior pericarditis.   Her current medications include glucosamine, calcium, Zoloft,  simvastatin, and aspirin.   PHYSICAL EXAMINATION:  VITAL SIGNS:  Blood pressure is 142/72 and then  pulse 59 and regular.  NECK:  There is no venous distension.  Carotid pulses were full without  bruits.  CHEST:  Clear.  CARDIAC:  Rhythm was regular.  No murmurs or gallops.  ABDOMEN:  Soft without organomegaly.  EXTREMITIES:  Peripheral pulses were full and no peripheral edema.   Electrocardiogram showed sinus rhythm overlying the pacemaker.   IMPRESSION:  1. Sick sinus syndrome status post synchrony dual-mode, dual-pacing,      dual-sensing pacemaker implantation in 1996.  2. History of pericarditis.  3. Hyperlipidemia.   RECOMMENDATIONS:  Ms. Yovanna Cogan is doing well.  We will plan  interrogate her pacemaker today.  Her direct LDL was 146 by Dr. Kriste Basque,  he increased her simvastatin.  She is scheduled to follow up lipid  profile next month which will schedule.  Otherwise, we will plan to see  her back in followup in a year.     Bruce Elvera Lennox Juanda Chance, MD, [facility]  Electronically Signed    BRB/MedQ  DD: 01/10/2008  DT: 01/11/2008  Job #: [id number]

## 2010-09-23 NOTE — Assessment & Plan Note (Signed)
[facility] HEALTHCARE                            CARDIOLOGY OFFICE NOTE   NAME:[name], SHADIAMOND KOSKA                      MRN:          [id number]  DATE:02/21/2008                            DOB:          1928/11/17    PRIMARY CARE PHYSICIAN:  Lonzo Cloud. Kriste Basque, MD   CLINICAL HISTORY:  [name] is 77 years old returned for a followup  visit early because her telephone check of her pacemaker showed that she  had reached elective replacement indication.  She had her synchrony II  pacemaker implanted in 06-01-96for pronounced vasovagal syncopal  episode.  She has done fairly well since that time.  She has had high  thresholds on atrial leads in the range of 3.5-4.5, but  has not needed  a pacemaker very often and has had no symptoms related to this.   PAST MEDICAL HISTORY:  Significant for prior pericarditis and  hyperlipidemia.   CURRENT MEDICATIONS:  Aspirin, glucosamine, multivitamin, Zoloft and  simvastatin.   PHYSICAL EXAMINATION:  GENERAL:  The blood pressure was 122/67, the  pulse 67 and regular.  NECK:  There was no venous tension.  The carotid pulses were full  without bruits.  CHEST:  Clear without rales or rhonchi.  HEART:  Rhythm was regular.  No murmurs, rubs, or gallops.  ABDOMEN:  Soft without organomegaly.  EXTREMITIES:  Peripheral pulses were full with no peripheral edema.   Her __________ showed that she had reached ERI.  This is based on her  magnet rate.  Previously on her interrogation, she was found to have a  threshold of 3.5 on the atrial lead and 1.5 on the ventricular lead.  She did have good sensing on the atrial lead.  I do not have the  frequency of atrial and ventricular pacing, but she was on her almost  all the time.   IMPRESSION:  1. Sick sinus syndrome status post synchrony II DDD pacemaker      implantation in 10-10-94 now reaching ERI, not pacer dependent.  2. History of pericarditis.  3. Hyperlipidemia.   RECOMMENDATIONS:  Ms. [name] has reached ERI and we will arrange for  her to come in for a generator replacement next week.  Although her  threshold on atrial lead is quite high, I do not think we will need to  replace this lead since she uses her pacemaker very little.     Bruce Elvera Lennox Juanda Chance, MD, [facility]  Electronically Signed   BRB/MedQ  DD: 02/21/2008  DT: 02/22/2008  Job #: [id number]

## 2010-09-24 ENCOUNTER — Ambulatory Visit (INDEPENDENT_AMBULATORY_CARE_PROVIDER_SITE_OTHER): Payer: Medicare Other | Admitting: Internal Medicine

## 2010-09-24 ENCOUNTER — Encounter: Payer: Self-pay | Admitting: Internal Medicine

## 2010-09-24 VITALS — BP 132/70 | HR 74 | Ht 63.0 in | Wt 154.0 lb

## 2010-09-24 DIAGNOSIS — R159 Full incontinence of feces: Secondary | ICD-10-CM

## 2010-09-24 DIAGNOSIS — K589 Irritable bowel syndrome without diarrhea: Secondary | ICD-10-CM

## 2010-09-24 NOTE — Progress Notes (Signed)
[name] 20-Jul-1928 MRN [id number]     History of Present Illness:  This is a 76 year old white female who had a recent upper endoscopy for gastroesophageal reflux symptoms. She is having a new problem of anal soreness and leakage after having a bowel movement. She had a recent colonoscopy on 09/03/2010 with findings of mild diverticulosis. She has been having difficulty with diarrhea predominant irritable bowel syndrome. Her main complaint is incomplete evacuation andleakage of a small amount of stool on her underwear causing soreness around the rectum. She denies rectal bleeding.   Past Medical History  Diagnosis Date  . Hearing loss   . Allergic rhinitis   . Sick sinus syndrome   . Presence of permanent cardiac pacemaker   . History of pericarditis   . Hypercholesteremia   . GERD (gastroesophageal reflux disease)   . IBS (irritable bowel syndrome)   . Diverticulosis of colon   . DJD (degenerative joint disease)   . Lumbar back pain   . Anxiety and depression   . Anemia   . Monoclonal gammopathy   . Anxiety   . Cataract   . Depression   . Osteoporosis     osteopenia  . Hiatal hernia   . Gastric polyps    Past Surgical History  Procedure Date  . Pacemaker placement   . Appendectomy   . Dilation and curettage of uterus 03/24/10    reports that she quit smoking about 23 years ago. Her smoking use included Cigarettes. She quit after 10 years of use. She has never used smokeless tobacco. She reports that she does not drink alcohol or use illicit drugs. family history includes Brain cancer in her brother; Clotting disorder in her sister; Colon cancer in her daughters; Crohn's disease in her sister; Diabetes in her sister; Heart attack in her father; Heart disease in her brother; Heart failure in her mother; Lung cancer in her sister; Pancreatic cancer in her brother and sister; Parkinsonism in her sister; and Rectal cancer in her daughter. No Known Allergies       Review of Systems: Denies abdominal pain, chest pain or shortness of breath. Denies rectal bleeding.  The remainder of the 10  point ROS is negative except as outlined in H&P   Physical Exam: General appearance  Well developed, in no distress. Eyes- non icteric. HEENT nontraumatic, normocephalic. Mouth no lesions, tongue papillated, no cheilosis. Neck supple without adenopathy, thyroid not enlarged, no carotid bruits, no JVD. Lungs Clear to auscultation bilaterally. Cor normal S1 normal S2, regular rhythm , no murmur,  quiet precordium. Abdomen normal active bowel sounds. Soft abdomen with mild tenderness in left lower quadrant. No palpable mass. Rectal:  exam reveals perianal erythema and hyperpigmentation of the skin from chronic inflammation. There is no fistula. Small external hemorrhoids. Slightly decreased rectal sphincter tone. Small amount of Hemoccult negative stool in the ampulla, no evidence of fissure. Extremities no pedal edema. Skin no lesions. Neurological alert and oriented x 3. Psychological normal mood and affect.  Assessment and Plan:  Problem #1 rectal drainage. This is most likely due to a rectocele causing incomplete evacuation. She also has decreased rectal sphincter tone. She may use Glycerine suppository or manual pressure to help to empty the rectum. She will continue Analpram cream at night and Calmoseptine lotion during the day. I have also given her samples of Balneol cleaning solution which she may use during the day.    Problem #2 family history of colon cancer. Patient is status post  recent colonoscopy. There is no recall colonoscopy planned due to age.  Problem #3 gastroesophageal reflux currently well controlled on omeprazole 20 mg daily.      09/24/2010 Lina Sar

## 2010-09-24 NOTE — Patient Instructions (Addendum)
Follow up as needed Dr Nadel 

## 2010-09-26 NOTE — Discharge Summary (Signed)
[place]  Patient:    [name], [name]                       MRN: [id number] Adm. Date:  [id number] Disc. Date: 01/07/00 Attending:  Lenoria Farrier Dictator:   Jacolyn Reedy, P.A.C. CC:    Lonzo Cloud. Kriste Basque, M.D. [facility]                           Discharge Summary  DATE OF BIRTH:  [birth year]  ADMISSION DIAGNOSIS:  Chest pain, rule out pericarditis, rule out myocardial infarction.  DISCHARGE DIAGNOSES: 1. Acute pericarditis, improving. 2. Hyperlipidemia. 3. Family history of coronary artery disease. 4. Status post PTVDP in 1996.  BRIEF HISTORY OF PRESENT ILLNESS:  Please see H&P for details.  This is a 77 year old widowed, white female patient with history a pacemaker, hyperlipidemia, and family history of coronary artery disease. The day prior to admission, she developed chest pain worse with deep inspiration, laying down, and bending over. She had no radiation, shortness of breath, or diaphoresis. She worked out yesterday without any problem and did not do anymore than usual. She has no exertionalsymptoms. She took three Aleve this morning and is feeling better in theoffice. EKG showed a ST elevation inferior and anterolaterally. She has had no recent viral infections, but she did have a recent trip to [place].She was seen by Lonzo Cloud. Kriste Basque, M.D. who sent her up to Dr. Delia Chimes office. On exam, she did have a small pleural rub when sitting forward. She was admitted to the hospital to rule out MI, as well as pulmonary embolus.  HOSPITAL COURSE:  A CT scan showed no evidence of pulmonary embolusor DVT. EKGs:  ST elevation remained. CKs, MBs, and troponins were negative. All other lab work was within normal limits, except a sed rate was elevated at 50. ANA was pending.  The patient was placed on Naprosyn and was feeling much better the following morning with not as much pain with deep inspiration and is now ready for discharge. A 2-D echo  was performed and showed no evidence of effusion and normal LV function. The patient was stable for discharge and sent home on January 07, 2000.  LABORATORY DATA:  Hemoglobin 12, hematocrit 38, white count was 10.3. Sed rate was 50. ANA was normal. Sodium 139, potassium 4.2, chloride 106, CO2 27, BUN 22, creatinine 1. Troponins and CK-MB are negative. Protime is slightly elevated at 15.1.  EKG:  ST elevation remained and unchanged.  A 2-D echo:  Overall normal LV systolic function. Ejection fraction55 to 65%. There were no left ventricular regional wall motion abnormalities. Left ventricular wall thickness was normal. There was mild mitral valve regurgitation. The right ventricle was mildly dilated. The estimatedpeak right ventricular systolic pressure was at the upper limits of normal. The right atrium was mildly dilated.  DISPOSITION:  The patient is discharged home in stable condition.  DISCHARGE MEDICATIONS: 1. Zocor 20 mg q.d. 2. Zoloft 50 mg q.d. 3. Naprosyn 500 mg b.i.d.  ACTIVITIES:  She is to do no strenuous activity.  FOLLOW-UP:  Everardo Beals Juanda Chance, M.D. September 17 at 10:45. DD:  01/07/00 TD: /  / Job: [id number]

## 2010-09-26 NOTE — Assessment & Plan Note (Signed)
[facility]                              CARDIOLOGY OFFICE NOTE   NAME:[name]                      MRN:          [id number]  DATE:01/20/2006                            DOB:          1929-02-18    CLINICAL HISTORY:  [name] is 77 years old and returned for management  of her pacemaker.  She has sick sinus syndrome and had a Synchrony DD easy  pacemaker implanted in 1996.  She has done quite well since that time with  no recent symptoms of palpitations or presyncope.   PAST MEDICAL HISTORY:  Significant for hyperlipidemia and pericarditis.   CURRENT MEDICATIONS:  Zocor, Zoloft, aspirin, glucosamine, and  multivitamins.   ON EXAMINATION TODAY:  VITAL SIGNS:  Blood pressure 130/70.  Pulse 68 and  regular.  NECK:  There was no venous distention.  The carotid pulses are full without  bruits.  CHEST:  Clear.  CARDIAC:  Rhythm is regular.  No murmurs or gallops.  ABDOMEN:  Soft.  No organomegaly.  EXTREMITIES:  Peripheral pulses are full with no peripheral edema.  We checked the pacemaker and the thresholds were good.  Her threshold was  high on the atrial channel and good on the ventricular channel.  However,  she is overriding her pacemaker most all the time.   IMPRESSION:  1. Sick sinus syndrome, status post implantation of Synchrony DD pacemaker      in 1996 with a good pacer function but high threshold in the atrial      lead.  2. History of pericarditis.  3. Hyperlipidemia.   RECOMMENDATIONS:  I think [name] is doing quite well.  We will continue  to follow her by phone checks with her pacemaker and we will see her back in  followup in a year.  She had a lipid profile by Dr. Kriste Basque recently which was  quite good.  She does have a twin sister who has coronary heart disease.                               Bruce Elvera Lennox Juanda Chance, MD, [facility]    BRB/MedQ  DD:  01/20/2006  DT:  01/20/2006  Job #:  [id number]

## 2010-09-26 NOTE — Assessment & Plan Note (Signed)
[facility]                         GASTROENTEROLOGY OFFICE NOTE   NAME:Boss, KHAMARI YOUSUF                      MRN:          [id number]  DATE:05/25/2006                            DOB:          01-Feb-1929    Ms. Ellzey is a very nice 77 year old white female whom we have  followed for irritable bowel syndrome and for colorectal screening.  Her  daughter, Amy, had colon cancer at the age of 6 and we have been  screening Ms. Tramell with colonoscopy 35 years, last 1 in 2002.  She  comes with 2 issues today.  One is occasional gastroesophageal reflux,  and the second is nocturnal diarrhea, which occurs now 3 times in 3  separate occasions.  She cannot pinpoint anything she ate.  There was no  abdominal pain.  No rectal bleeding.  Her colonoscopy in December 2002  showed mild diverticulosis of the left colon.   MEDICATIONS:  1. Zocor 20 mg p.o. daily.  2. Zoloft 75 mg p.o. nightly.  3. Aspirin.  4. Glucosamine.  5. Multiple vitamins.   PHYSICAL EXAM:  Blood pressure 118/72, pulse 82, and weight 151 pounds.  She was alert and oriented, hard of hearing.  She had hearing aids.  LUNGS:  Clear to auscultation.  COR:  Normal S1, normal S2.  ABDOMEN:  Soft and nontender with normoactive bowel sounds.  No  distention.  Liver edge at costal margin.  RECTAL:  Normal rectal tone.  Stool was soft.  Hemoccult negative.  Rectal tone was normal.  She had a normal squeeze.   IMPRESSION:  23. A 77 year old white female with mild gastroesophageal reflux.  No      evidence of dysphagia.  She has not been really treated for it yet.  2. Irritable bowel syndrome with a history of nocturnal diarrheal      episodes.  This could be related to bacterial overgrowth due to      food intolerance or possibly diverticulosis.   PLAN:  1. Upper abdominal ultrasound because she is concerned about a family      history of pancreatic cancer in 1 brother and 1 sister.  2. Pepcid  40 mg daily 3 month supply mail order.  3. Probiotics Align to take on daily basis, to normalize her bacterial      flora.  4. Next colonoscopy recall December 2009.  5. High fiber diet with fiber supplements.  I ask her to eat less for      supper and more for breakfast and lunch.     Hedwig Morton. Juanda Chance, MD  Electronically Signed    DMB/MedQ  DD: 05/25/2006  DT: 05/25/2006  Job #: [id number]

## 2010-10-02 ENCOUNTER — Ambulatory Visit (INDEPENDENT_AMBULATORY_CARE_PROVIDER_SITE_OTHER): Payer: 59 | Admitting: Professional

## 2010-10-02 DIAGNOSIS — F4323 Adjustment disorder with mixed anxiety and depressed mood: Secondary | ICD-10-CM

## 2010-10-09 ENCOUNTER — Ambulatory Visit (INDEPENDENT_AMBULATORY_CARE_PROVIDER_SITE_OTHER): Payer: 59 | Admitting: Professional

## 2010-10-09 DIAGNOSIS — F4323 Adjustment disorder with mixed anxiety and depressed mood: Secondary | ICD-10-CM

## 2010-10-16 ENCOUNTER — Ambulatory Visit (INDEPENDENT_AMBULATORY_CARE_PROVIDER_SITE_OTHER): Payer: 59 | Admitting: Professional

## 2010-10-16 DIAGNOSIS — F4323 Adjustment disorder with mixed anxiety and depressed mood: Secondary | ICD-10-CM

## 2010-10-23 ENCOUNTER — Ambulatory Visit (INDEPENDENT_AMBULATORY_CARE_PROVIDER_SITE_OTHER): Payer: 59 | Admitting: Professional

## 2010-10-23 DIAGNOSIS — F331 Major depressive disorder, recurrent, moderate: Secondary | ICD-10-CM

## 2010-11-06 ENCOUNTER — Other Ambulatory Visit: Payer: Self-pay | Admitting: *Deleted

## 2010-11-06 MED ORDER — SIMVASTATIN 20 MG PO TABS: 20.0000 mg | ORAL_TABLET | Freq: Every day | ORAL | Status: DC

## 2010-12-02 DIAGNOSIS — I495 Sick sinus syndrome: Secondary | ICD-10-CM

## 2010-12-18 NOTE — Progress Notes (Signed)
  Subjective:    Patient ID: [name], female    DOB: August 09, 1928, 77 y.o.   MRN: [id number]  HPI PT HAD PPD PLACED AND READ BY NURSE... smn  Review of Systems     Objective:   Physical Exam        Assessment & Plan:

## 2011-01-05 ENCOUNTER — Other Ambulatory Visit: Payer: Self-pay | Admitting: Pulmonary Disease

## 2011-01-05 DIAGNOSIS — Z1231 Encounter for screening mammogram for malignant neoplasm of breast: Secondary | ICD-10-CM

## 2011-01-22 ENCOUNTER — Ambulatory Visit (INDEPENDENT_AMBULATORY_CARE_PROVIDER_SITE_OTHER): Payer: 59 | Admitting: Professional

## 2011-01-22 DIAGNOSIS — F4323 Adjustment disorder with mixed anxiety and depressed mood: Secondary | ICD-10-CM

## 2011-01-23 ENCOUNTER — Ambulatory Visit
Admission: RE | Admit: 2011-01-23 | Discharge: 2011-01-23 | Disposition: A | Discharge status: Home / Self Care | Payer: Medicare Other | Source: Ambulatory Visit | Attending: Pulmonary Disease | Admitting: Pulmonary Disease

## 2011-01-23 DIAGNOSIS — Z1231 Encounter for screening mammogram for malignant neoplasm of breast: Secondary | ICD-10-CM

## 2011-01-29 ENCOUNTER — Other Ambulatory Visit: Payer: Self-pay | Admitting: Pulmonary Disease

## 2011-01-29 DIAGNOSIS — R928 Other abnormal and inconclusive findings on diagnostic imaging of breast: Secondary | ICD-10-CM

## 2011-02-09 ENCOUNTER — Ambulatory Visit
Admission: RE | Admit: 2011-02-09 | Discharge: 2011-02-09 | Disposition: A | Discharge status: Home / Self Care | Payer: Medicare Other | Source: Ambulatory Visit | Attending: Pulmonary Disease | Admitting: Pulmonary Disease

## 2011-02-09 DIAGNOSIS — R928 Other abnormal and inconclusive findings on diagnostic imaging of breast: Secondary | ICD-10-CM

## 2011-02-09 LAB — BASIC METABOLIC PANEL WITH GFR
BUN: 15
CO2: 28
Calcium: 8.9
Chloride: 105
Creatinine, Ser: 0.76
GFR calc non Af Amer: >60
Glucose, Bld: 101 — ABNORMAL HIGH
Potassium: 3.7
Sodium: 138

## 2011-02-09 LAB — PROTIME-INR
INR: 1
Prothrombin Time: 13.4

## 2011-02-09 LAB — CBC
HCT: 38
Hemoglobin: 12.8
MCHC: 33.7
MCV: 92.5
Platelets: 214
RBC: 4.11
RDW: 13.6
WBC: 7.6

## 2011-02-09 LAB — APTT: aPTT: 27

## 2011-02-12 ENCOUNTER — Ambulatory Visit (INDEPENDENT_AMBULATORY_CARE_PROVIDER_SITE_OTHER): Payer: Medicare Other | Admitting: Professional

## 2011-02-12 DIAGNOSIS — F4323 Adjustment disorder with mixed anxiety and depressed mood: Secondary | ICD-10-CM

## 2011-02-20 ENCOUNTER — Encounter: Payer: Self-pay | Admitting: Internal Medicine

## 2011-02-20 ENCOUNTER — Ambulatory Visit (INDEPENDENT_AMBULATORY_CARE_PROVIDER_SITE_OTHER): Payer: Medicare Other | Admitting: Internal Medicine

## 2011-02-20 DIAGNOSIS — I495 Sick sinus syndrome: Secondary | ICD-10-CM

## 2011-02-20 LAB — PACEMAKER DEVICE OBSERVATION
AL AMPLITUDE: 1.1 mv
AL IMPEDENCE PM: 288 Ohm
BATTERY VOLTAGE: 2.79 V
RV LEAD AMPLITUDE: 4.7 mv
RV LEAD IMPEDENCE PM: 506 Ohm

## 2011-02-20 NOTE — Progress Notes (Signed)
The patient presents today for routine electrophysiology followup.  Since last being seen in our clinic, the patient reports doing very well.  Today, she denies symptoms of palpitations, chest pain, shortness of breath, orthopnea, PND, lower extremity edema, dizziness, presyncope, syncope, or neurologic sequela.  The patient feels that she is tolerating medications without difficulties and is otherwise without complaint today.   Past Medical History  Diagnosis Date  . Hearing loss   . Allergic rhinitis   . Sick sinus syndrome   . Presence of permanent cardiac pacemaker   . History of pericarditis   . Hypercholesteremia   . GERD (gastroesophageal reflux disease)   . IBS (irritable bowel syndrome)   . Diverticulosis of colon   . DJD (degenerative joint disease)   . Lumbar back pain   . Anxiety and depression   . Anemia   . Monoclonal gammopathy   . Anxiety   . Cataract   . Depression   . Osteoporosis     osteopenia  . Hiatal hernia   . Gastric polyps    Past Surgical History  Procedure Date  . Pacemaker placement     most recent generator replaced 2009 by Dr Juanda Chance  . Appendectomy   . Dilation and curettage of uterus 03/24/10    Current Outpatient Prescriptions  Medication Sig Dispense Refill  . aspirin 81 MG tablet Take 81 mg by mouth daily.        . Calcium Carbonate-Vitamin D (CALCIUM-VITAMIN D) 500-200 MG-UNIT per tablet Take 1 tablet by mouth daily.        . Glucosamine 500 MG CAPS Take 2 capsules by mouth daily.        Marland Kitchen ibuprofen (ADVIL,MOTRIN) 200 MG tablet as needed.        Marland Kitchen LORazepam (ATIVAN) 1 MG tablet Take 1/2 to 1 tab by mouth three times a day as needed for anxiety.  90 tablet  5  . Multiple Vitamin (MULTIVITAMIN) tablet Take 1 tablet by mouth daily.        Marland Kitchen omeprazole (PRILOSEC) 20 MG capsule Take 20 mg by mouth as needed.        . sertraline (ZOLOFT) 50 MG tablet Take 2 tablets (100 mg total) by mouth daily.  60 tablet  5  . simvastatin (ZOCOR) 20 MG  tablet Take 1 tablet (20 mg total) by mouth at bedtime.  90 tablet  1  . vitamin B-12 (CYANOCOBALAMIN) 1000 MCG tablet Take 1,000 mcg by mouth daily.         Current Facility-Administered Medications  Medication Dose Route Frequency Provider Last Rate Last Dose  . DISCONTD: 0.9 %  sodium chloride infusion  500 mL Intravenous Continuous Hart Carwin, MD        No Known Allergies  History   Social History  . Marital Status: Widowed    Spouse Name: N/A    Number of Children: 3  . Years of Education: N/A   Occupational History  .     Social History Main Topics  . Smoking status: Former Smoker -- 10 years    Types: Cigarettes    Quit date: 05/12/1987  . Smokeless tobacco: Never Used  . Alcohol Use: No     wine occasionally  . Drug Use: No  . Sexually Active: Not on file   Other Topics Concern  . Not on file   Social History Narrative   Pt has 12 siblings.Alcohol use- noDrug use- noDaily caffeine use    Family History  Problem Relation Age of Onset  . Heart failure Mother   . Heart attack Father   . Parkinsonism Sister   . Rectal cancer Daughter   . Colon cancer Daughter   . Pancreatic cancer Brother   . Pancreatic cancer Sister   . Brain cancer Brother   . Heart disease Brother   . Colon cancer Daughter   . Diabetes Sister   . Clotting disorder Sister   . Lung cancer Sister     twin  . Crohn's disease Sister     twin    ROS-  All systems are reviewed and are negative except as outlined in the HPI above   Physical Exam: Filed Vitals:   02/20/11 1059  BP: 132/58  Pulse: 63  Height: 5\' 3"  (1.6 m)  Weight: 152 lb (68.947 kg)    GEN- The patient is well appearing, alert and oriented x 3 today.   Head- normocephalic, atraumatic Eyes-  Sclera clear, conjunctiva pink Ears- hearing intact Oropharynx- clear Neck- supple, no JVP Lymph- no cervical lymphadenopathy Lungs- Clear to ausculation bilaterally, normal work of breathing Chest- pacemaker pocket is  well healed Heart- Regular rate and rhythm, no murmurs, rubs or gallops, PMI not laterally displaced GI- soft, NT, ND, + BS Extremities- no clubbing, cyanosis, or edema  Pacemaker interrogation- reviewed in detail today,  See PACEART report  Assessment and Plan:

## 2011-02-20 NOTE — Assessment & Plan Note (Signed)
Normal pacemaker function See Pace Art report No changes today  

## 2011-02-20 NOTE — Patient Instructions (Signed)
Your physician wants you to follow-up in: 12 months with Dr Allred You will receive a reminder letter in the mail two months in advance. If you don't receive a letter, please call our office to schedule the follow-up appointment.  

## 2011-03-04 ENCOUNTER — Telehealth: Payer: Self-pay | Admitting: Pulmonary Disease

## 2011-03-04 NOTE — Telephone Encounter (Signed)
Pt will need to schedule appt before any refills can be sent in.  Once appt has been made we can sent in enough to last.  thanks

## 2011-03-04 NOTE — Telephone Encounter (Signed)
Please advise if okay to refill pt's lorazepam 1 mg. She had this filled last on 11/15/10 and last ov with SN on 08/14/10 with recs to return in Oct 2012. No appts pending. Please advise, thanks! No Known Allergies

## 2011-03-04 NOTE — Telephone Encounter (Signed)
LMTCB for pt 

## 2011-03-09 MED ORDER — LORAZEPAM 1 MG PO TABS: ORAL_TABLET | ORAL | Status: DC

## 2011-03-09 NOTE — Telephone Encounter (Signed)
Patient calling back.  She made an appointment w/ SN for 12/4 @ 3:00, which is SN's next available.   Please call in rx.

## 2011-03-09 NOTE — Telephone Encounter (Signed)
Lorazepam called to [facility] for [facility] w/ 1 additional refill so the pt has enough medicine until OV on 04/14/2011 w/ SN.  Pt also notified.

## 2011-04-08 ENCOUNTER — Ambulatory Visit: Payer: Medicare Other | Admitting: Internal Medicine

## 2011-04-09 ENCOUNTER — Telehealth: Payer: Self-pay | Admitting: Pulmonary Disease

## 2011-04-09 DIAGNOSIS — E7800 Pure hypercholesterolemia, unspecified: Secondary | ICD-10-CM

## 2011-04-09 DIAGNOSIS — M545 Low back pain: Secondary | ICD-10-CM

## 2011-04-09 NOTE — Telephone Encounter (Signed)
Pt has appt with Dr Kriste Basque on 04-14-11 at 3:00.  Pt would like to have labs done prior so she will not have to fast that long.  Please advise on what labs to order.

## 2011-04-09 NOTE — Telephone Encounter (Signed)
Called and spoke with pt and she is aware of labs in the computer for her to come by when she is able.  Pt voiced her understanding of this.

## 2011-04-14 ENCOUNTER — Other Ambulatory Visit (INDEPENDENT_AMBULATORY_CARE_PROVIDER_SITE_OTHER): Payer: Medicare Other

## 2011-04-14 ENCOUNTER — Encounter: Payer: Self-pay | Admitting: Pulmonary Disease

## 2011-04-14 ENCOUNTER — Ambulatory Visit (INDEPENDENT_AMBULATORY_CARE_PROVIDER_SITE_OTHER): Payer: Medicare Other | Admitting: Pulmonary Disease

## 2011-04-14 ENCOUNTER — Other Ambulatory Visit: Payer: Self-pay | Admitting: Pulmonary Disease

## 2011-04-14 DIAGNOSIS — F341 Dysthymic disorder: Secondary | ICD-10-CM

## 2011-04-14 DIAGNOSIS — M545 Low back pain, unspecified: Secondary | ICD-10-CM

## 2011-04-14 DIAGNOSIS — K5730 Diverticulosis of large intestine without perforation or abscess without bleeding: Secondary | ICD-10-CM

## 2011-04-14 DIAGNOSIS — K219 Gastro-esophageal reflux disease without esophagitis: Secondary | ICD-10-CM

## 2011-04-14 DIAGNOSIS — I495 Sick sinus syndrome: Secondary | ICD-10-CM

## 2011-04-14 DIAGNOSIS — E7800 Pure hypercholesterolemia, unspecified: Secondary | ICD-10-CM

## 2011-04-14 DIAGNOSIS — D472 Monoclonal gammopathy: Secondary | ICD-10-CM

## 2011-04-14 DIAGNOSIS — M1990 Unspecified osteoarthritis, unspecified site: Secondary | ICD-10-CM

## 2011-04-14 DIAGNOSIS — Z950 Presence of cardiac pacemaker: Secondary | ICD-10-CM

## 2011-04-14 DIAGNOSIS — K589 Irritable bowel syndrome without diarrhea: Secondary | ICD-10-CM

## 2011-04-14 LAB — LIPID PANEL
HDL: 65.2 mg/dL (ref >39.00)
Total CHOL/HDL Ratio: 4
VLDL: 22.2 mg/dL (ref 0.0–40.0)

## 2011-04-14 LAB — BASIC METABOLIC PANEL
BUN: 19 mg/dL (ref 6–23)
GFR: 70.24 mL/min (ref >60.00)
Potassium: 4.5 mEq/L (ref 3.5–5.1)
Sodium: 141 mEq/L (ref 135–145)

## 2011-04-14 LAB — HEPATIC FUNCTION PANEL
AST: 19 U/L (ref 0–37)
Alkaline Phosphatase: 95 U/L (ref 39–117)
Bilirubin, Direct: 0.1 mg/dL (ref 0.0–0.3)
Total Bilirubin: 0.3 mg/dL (ref 0.3–1.2)

## 2011-04-14 LAB — LDL CHOLESTEROL, DIRECT: Direct LDL: 143.7 mg/dL

## 2011-04-14 MED ORDER — AZITHROMYCIN 250 MG PO TABS: ORAL_TABLET | ORAL | Status: AC

## 2011-04-14 NOTE — Patient Instructions (Signed)
Today we updated your med list in our EPIC system...    Continue your current medications the same...  We wrote a new prescription for a ZPAK to use for upper resp infections as needed...  Call for any questions...  Let's plana follow up visit in 6 months.Marland KitchenMarland Kitchen

## 2011-04-14 NOTE — Progress Notes (Signed)
Subjective:    Patient ID: [name], female    DOB: 04-10-1929, 77 y.o.   MRN: [id number]  HPI 77 y/o WF here for a follow up visit... she has multiple medical problems including:  SSS- s/p pacer insertion;  Hx pericarditis;  Hyperchol;  GERD/ Divertics/ IBS;  DJD/ LBP;  Anxiety/ Depression;  Anemia/ Monoclonal Gammopathy/ Borderline B12 level...  ~  January 31, 2010:  2d hx left ear pain- went to audiologist & sent here for ear infection... min drainage, no fever/ chills/ sweats, no adenopathy... exam shows some exud & inflammed EAC> we discussed Rx w/ Cortisporin Otic + Augmentin orally... she has bilat hearing aides & some further decr hearing noted... she will f/u w/ DrCrossley as needed.  ~  August 14, 2910:  6 month ROV & doing satis she says> still under alot of stress w/ sister's illness etc;  She has Ativan to use Prn but only taking it Qhs & we discussed this...    Hearing Loss>  She has new hearing aides...    SSS>  Followed by DrBB> now DrAllred w/ SSS, palpit, s/p pacer (last generator change 2000);  Last seen 10/11 & stable...    Hx pericarditis>  This occurred in 2001;  Echo in 2004 showed prob patent foramen w/ left to right interatrial shunt by doppler> DrBrodie is aware & following...    Chol>  She has been on Simva20 + FishOil w/ decent numbers, but she stopped on her own- wants to check FLP off meds; TChol 277, TG 85, HDL 66, LDL 185;  REC to restart Rx.    GI>  Uses OTC PPI as needed;  Last colonoscopy 2002 by DrGessner (neg x divertics) & she refuses repeat procedure despite daugh hx colon ca;  She tells me she has appt w/ DrDBrodie soon for incr IBS symptoms & will discuss colonoscopy vs stool cards at that time.    DJD>  She has mild-mod diffuse DJD & uses OTC meds prn...    MGUS>  She has monoclonal IgG kappa paraprot on SPE/IEP 12/10 w/ norm quant immunoglobulins;  B12 was low-normal range at 235;  Due for f/u labs> pending.  ~  April 14, 2011:  43mo ROV & she  presents w/ URI x 1d w/ nasal congestion, drainage- clear watery, cough w/o sput, denis f/c/s, denies SOB> we discussed ZPak & OTC meds for as needed use... she [place] 8/12 & very happy there...  She saw DrAllred 10/12 for f/u SSS, pacer, palpit & doing well, no changes made;  She also saw DrDBrodie 5/12 w/ GERD/ Divertics/ IBS w/ EGD & Colon earlier this yr; c/o anal leakage most likely due to rectocele & decr sphincter tone- her note is reviewed...  She had recent fasting blood work> FLP reflects the fact that she is not taking the Simva20 daily & she is rec to do so;  Chems look good...  See prob list below>>          Problem List:   HEARING LOSS (ICD-389.9) - she wears bilat hearing aides & has been tested by DrPahel...  ALLERGIC RHINITIS (ICD-477.9) - uses OTC antihistamines Prn...  SICK SINUS SYNDROME (ICD-427.81) - s/p pacemaker placed in 1996 for SSS... yearly pacer f/u by DrBrodie- generator changed 10/09 & seen 10/10 doing well... ~  She now follows up w/ DrAllred annually & doing well w/o CP, palpit, dizzy, SOB, edema, etc....  Hx of PERICARDITIS (ICD-423.9) - hosp in 2001  w/ CP & acute pericarditis... serial Echo's showed resolution of the effusion... ~  2DEcho 5/04 showed a prob patent foramen ovale w/ left to right interatrial shunt by doppler... DrBBrodie is aware & following. ~  NuclearStressTest 5/04 was negative- no ischemia, no infarct, EF= 65%... ~  repeat Nuclear Stress Test 1/10 was neg- no scar or ischemia, EF=74%...  HYPERCHOLESTEROLEMIA (ICD-272.0) - on SIMVASTATIN 20mg /d & low fat diet... ~  FLP 7/08 showed TChol 196, TG 80, HDL 53, LDL 127... ~  FLP 6/09 off med showed TChol 237, TG 106, HDL 57, LDL 147... restart Simva20. ~  FLP 9/09 on Simva20 showed TChol 147, TG 73, HDL 55, LDL 78 ~  FLP 10/10 on Simva20 showed TChol 172, TG 95, HDL 58, LDL 95 ~  FLP 4/12 off meds showed TChol 277, TG 85, HDL 66, LDL 185... rec to restart Simv20  daily... ~  FLP 12/12 on Simva20 showed TChol 242, TG 111, HDL 65, LDL 144... Admits not taking med regularly, advised to take it every day!!!  GERD (ICD-530.81) - uses OTC PRILOSEC 20mg  Prn... ~  4/12:  EGD by DrDBrodie showed HH, esophagitis, mild gastritis, mult tiny gastric polyps, neg HPylori...  DIVERTICULOSIS OF COLON (ICD-562.10) - prev colonoscopy 12/02 by Rodena Medin w/ divertics only... there is a +fam hx of colon cancer in her youngest daughter... f/u colonoscopy is overdue (she refused). ~  4/12: she finally agreed to f/u colon> mild divertics, otherw neg, rec to incr fiber...  IRRITABLE BOWEL SYNDROME (ICD-564.1) - trial BENTYL 20mg  Prn for abd cramping... ~  last saw DrDBrodie for GI 2/10 w/ diarrhea, hx IBS, +FamHx ColonCa>> Rx Flagyl, Probiotic; pt refused colonoscopy. ~  [facility] 1/11 w/ severe gastroenteritis/ diarrhea/ mild dehydration- CDiff neg & Lactoferrin pos- treated w/ Cipro/ Flagyl/ Florastor & resolved.  DEGENERATIVE JOINT DISEASE (ICD-715.90) - she has mild-mod DJD (hands, etc) and Rx w/ Advil, Glucosamine...  Hx of BACK PAIN, LUMBAR (ICD-724.2) - eval by DrWillis in 2005 w/ LBP, some leg paresthesias and MRI showing DDD and mild sp stenosis... Rx'd w/ Aleve and Neurontin at that time...  ANXIETY DEPRESSION (ICD-300.4) - she is quite anxious and uses ATIVAN 1mg - 1/2 to 1 tab Tid Prn + ZOLOFT 50mg - 2 daily (she prev tried weaning off the Zoloft but felt worse off this med)... she notes that depression runs in her family.  ANEMIA (ICD-285.9) & MONOCLONAL GAMMOPATHY (ICD-273.1) - diagnosed 12/10 as below>> rec to start WOMENS MULTIVIT Daily & VIT B12 daily... 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 we plan f/u B12 & SPE/ IEP... ~  Labs 4/12 Hg= 13.2, B12= 420, SPE no Mspike detected...   Past Surgical History  Procedure Date  . Pacemaker  placement     most recent generator replaced 2009 by Dr Juanda Chance  . Appendectomy   . Dilation and curettage of uterus 03/24/10    Outpatient Encounter Prescriptions as of 04/14/2011  Medication Sig Dispense Refill  . aspirin 81 MG tablet Take 81 mg by mouth daily.        . Biotin 1000 MCG tablet Take 1,000 mcg by mouth daily.        . Calcium Carbonate-Vitamin D (CALCIUM-VITAMIN D) 500-200 MG-UNIT per tablet Take 1 tablet by mouth daily.        . Glucosamine 500 MG CAPS Take 2 capsules by mouth daily.        Marland Kitchen  ibuprofen (ADVIL,MOTRIN) 200 MG tablet as needed.        Marland Kitchen LORazepam (ATIVAN) 1 MG tablet Take 1/2 to 1 tab by mouth three times a day as needed for anxiety.  90 tablet  1  . Multiple Vitamin (MULTIVITAMIN) tablet Take 1 tablet by mouth daily.        Marland Kitchen omeprazole (PRILOSEC) 20 MG capsule Take 20 mg by mouth as needed.        . sertraline (ZOLOFT) 50 MG tablet Take 2 tablets (100 mg total) by mouth daily.  60 tablet  5  . simvastatin (ZOCOR) 20 MG tablet Take 1 tablet (20 mg total) by mouth at bedtime.  90 tablet  1  . DISCONTD: vitamin B-12 (CYANOCOBALAMIN) 1000 MCG tablet Take 1,000 mcg by mouth daily.          No Known Allergies   Current Medications, Allergies, Past Medical History, Past Surgical History, Family History, and Social History were reviewed in [employer] record.    Review of Systems        See HPI - all other systems are neg except as noted... The patient complains of decreased hearing.  The patient denies anorexia, fever, weight loss, weight gain, vision loss, hoarseness, chest pain, syncope, dyspnea on exertion, peripheral edema, prolonged cough, headaches, hemoptysis, abdominal pain, melena, hematochezia, severe indigestion/heartburn, hematuria, incontinence, muscle weakness, suspicious skin lesions, transient blindness, difficulty walking, depression, unusual weight change, abnormal bleeding, enlarged lymph nodes, and angioedema.      Objective:   Physical Exam     WD, WN, 77 y/o WF in NAD... GENERAL:  Alert & oriented; pleasant & cooperative... HEENT:  [place]/AT, EOM-wnl, PERRLA, EACs- hearing aides bilat & left EAC exud/ sl red/ inflammed, NOSE-clear, THROAT-clear & wnl. NECK:  Supple w/ full ROM; no JVD; normal carotid impulses w/o bruits; no thyromegaly or nodules palpated; no lymphadenopathy. CHEST:  Clear to P & A; without wheezes/ rales/ or rhonchi. HEART:  Regular Rhythm; without murmurs/ rubs/ or gallops. ABDOMEN:  Soft & nontender; normal bowel sounds; no organomegaly or masses detected. EXT: without deformities, mild arthritic changes; no varicose veins/ +venous insuffic/ no edema. NEURO:  CN's intact;  no focal neuro deficits... DERM:  No lesions noted; no rash etc...  RADIOLOGY DATA:  Reviewed in the EPIC EMR & discussed w/ the patient...  LABORATORY DATA:  Reviewed in the EPIC EMR & discussed w/ the patient...   Assessment & Plan:   Hearing Loss>  She has bilat hearing aides...  Cardiac> SSS, Pacer, Hx pericarditis>  Followed by DrAllred & pacer working fine, denies arrhythmias, palpit, etc...  Hyperchol>  On Simva20 but NOT taking regularly; asked to take it every day so we can assess it's efficacy, may need more expensive alternative drug...  HH/ GERD>  S/p EGD 4/12 by DrDBrodie; pt taking OTC Prilosec daily...  Divertics/ IBS/ leakage>  Colonoscopy 4/12 was essent neg, no polyps; leakage from rectocele & managed by GI- DrBrodie...  DJD/ LBP>  She has seen DrWillis in the past; taking Advil, Tylenol, OTC meds...  Anxiety/ Depression>  On Zoloft & Ativan...   Patient's Medications  New Prescriptions   No medications on file  Previous Medications   ASPIRIN 81 MG TABLET    Take 81 mg by mouth daily.     BIOTIN 1000 MCG TABLET    Take 1,000 mcg by mouth daily.     CALCIUM CARBONATE-VITAMIN D (CALCIUM-VITAMIN D) 500-200 MG-UNIT PER TABLET    Take 1  tablet by mouth daily.     GLUCOSAMINE 500  MG CAPS    Take 2 capsules by mouth daily.     IBUPROFEN (ADVIL,MOTRIN) 200 MG TABLET    as needed.     LORAZEPAM (ATIVAN) 1 MG TABLET    Take 1/2 to 1 tab by mouth three times a day as needed for anxiety.   MULTIPLE VITAMIN (MULTIVITAMIN) TABLET    Take 1 tablet by mouth daily.     OMEPRAZOLE (PRILOSEC) 20 MG CAPSULE    Take 20 mg by mouth as needed.     SERTRALINE (ZOLOFT) 50 MG TABLET    Take 2 tablets (100 mg total) by mouth daily.   SIMVASTATIN (ZOCOR) 20 MG TABLET    Take 1 tablet (20 mg total) by mouth at bedtime.  Modified Medications   No medications on file  Discontinued Medications   VITAMIN B-12 (CYANOCOBALAMIN) 1000 MCG TABLET    Take 1,000 mcg by mouth daily.

## 2011-04-28 ENCOUNTER — Ambulatory Visit (INDEPENDENT_AMBULATORY_CARE_PROVIDER_SITE_OTHER): Payer: Medicare Other

## 2011-04-28 ENCOUNTER — Ambulatory Visit (INDEPENDENT_AMBULATORY_CARE_PROVIDER_SITE_OTHER): Payer: 59 | Admitting: Professional

## 2011-04-28 DIAGNOSIS — F4323 Adjustment disorder with mixed anxiety and depressed mood: Secondary | ICD-10-CM

## 2011-04-28 DIAGNOSIS — Z23 Encounter for immunization: Secondary | ICD-10-CM

## 2011-05-08 ENCOUNTER — Encounter: Payer: Self-pay | Admitting: Pulmonary Disease

## 2011-05-19 ENCOUNTER — Ambulatory Visit (INDEPENDENT_AMBULATORY_CARE_PROVIDER_SITE_OTHER): Payer: 59 | Admitting: Professional

## 2011-05-19 DIAGNOSIS — F4323 Adjustment disorder with mixed anxiety and depressed mood: Secondary | ICD-10-CM

## 2011-05-22 ENCOUNTER — Encounter: Payer: Self-pay | Admitting: Internal Medicine

## 2011-05-22 DIAGNOSIS — I495 Sick sinus syndrome: Secondary | ICD-10-CM

## 2011-05-27 ENCOUNTER — Telehealth: Payer: Self-pay | Admitting: Pulmonary Disease

## 2011-05-27 MED ORDER — ONDANSETRON HCL 4 MG PO TABS: ORAL_TABLET | ORAL | Status: DC

## 2011-05-27 NOTE — Telephone Encounter (Signed)
I spoke with pt and is aware of zofran directions. She voiced her understanding and rx has been sent

## 2011-05-27 NOTE — Telephone Encounter (Signed)
Per SN---ok to call in zofran 4mg   #20  1 po every 6 hours prn nausea.  thanks

## 2011-05-27 NOTE — Telephone Encounter (Signed)
Called and spoke with pt. She states that since 05/24/11 she has felt very nauseated and has had no appetite. No BM in the past 3-4 days but she reports last BM was normal. She denies any abd pain, fever, vomiting. She only states feels bloated "but this is normal for me". Would like recs from SN. Please advise, thanks! No Known Allergies

## 2011-05-28 ENCOUNTER — Telehealth: Payer: Self-pay | Admitting: Pulmonary Disease

## 2011-05-28 NOTE — Telephone Encounter (Signed)
Pt asked to close this message.  Pt stated she spoke w/ her pharmacist & told her to take something to help her have a BM.  Pt stated nothing further is needed at this time.  [name]

## 2011-06-02 ENCOUNTER — Encounter: Payer: Self-pay | Admitting: Adult Health

## 2011-06-02 ENCOUNTER — Ambulatory Visit (INDEPENDENT_AMBULATORY_CARE_PROVIDER_SITE_OTHER): Payer: Medicare Other | Admitting: Adult Health

## 2011-06-02 DIAGNOSIS — K5900 Constipation, unspecified: Secondary | ICD-10-CM | POA: Insufficient documentation

## 2011-06-02 NOTE — Patient Instructions (Addendum)
Advance bland diet as tolerated.  Gas X with meals.  Miralax 1 capful At bedtime   Colace daily -stool softner for constipation.  Begin high fiber diet  Please contact office for sooner follow up if symptoms do not improve or worsen or seek emergency care  follow up Dr. Nadel  As planned and As needed    

## 2011-06-02 NOTE — Progress Notes (Signed)
Subjective:    Patient ID: [name], female    DOB: Nov 29, 1928, 78 y.o.   MRN: [id number]  HPI 78 y/o WF - has multiple medical problems including:  SSS- s/p pacer insertion;  Hx pericarditis;  Hyperchol;  GERD/ Divertics/ IBS;  DJD/ LBP;  Anxiety/ Depression;  Anemia/ Monoclonal Gammopathy/ Borderline B12 level...  ~  January 31, 2010:  2d hx left ear pain- went to audiologist & sent here for ear infection... min drainage, no fever/ chills/ sweats, no adenopathy... exam shows some exud & inflammed EAC> we discussed Rx w/ Cortisporin Otic + Augmentin orally... she has bilat hearing aides & some further decr hearing noted... she will f/u w/ DrCrossley as needed.  ~  August 14, 2910:  6 month ROV & doing satis she says> still under alot of stress w/ sister's illness etc;  She has Ativan to use Prn but only taking it Qhs & we discussed this...    Hearing Loss>  She has new hearing aides...    SSS>  Followed by DrBB> now DrAllred w/ SSS, palpit, s/p pacer (last generator change 2000);  Last seen 10/11 & stable...    Hx pericarditis>  This occurred in 2001;  Echo in 2004 showed prob patent foramen w/ left to right interatrial shunt by doppler> DrBrodie is aware & following...    Chol>  She has been on Simva20 + FishOil w/ decent numbers, but she stopped on her own- wants to check FLP off meds; TChol 277, TG 85, HDL 66, LDL 185;  REC to restart Rx.    GI>  Uses OTC PPI as needed;  Last colonoscopy 2002 by DrGessner (neg x divertics) & she refuses repeat procedure despite daugh hx colon ca;  She tells me she has appt w/ DrDBrodie soon for incr IBS symptoms & will discuss colonoscopy vs stool cards at that time.    DJD>  She has mild-mod diffuse DJD & uses OTC meds prn...    MGUS>  She has monoclonal IgG kappa paraprot on SPE/IEP 12/10 w/ norm quant immunoglobulins;  B12 was low-normal range at 235;  Due for f/u labs> pending.  ~  April 14, 2011:  76mo ROV & she presents w/ URI x 1d w/ nasal  congestion, drainage- clear watery, cough w/o sput, denis f/c/s, denies SOB> we discussed ZPak & OTC meds for as needed use... [place] 8/[place] & very happy there...  She saw DrAllred 10/12 for f/u SSS, pacer, palpit & doing well, no changes made;  She also saw DrDBrodie 5/12 w/ GERD/ Divertics/ IBS w/ EGD & Colon earlier this yr; c/o anal leakage most likely due to rectocele & decr sphincter tone- her note is reviewed...  She had recent fasting blood work> FLP reflects the fact that she is not taking the Simva20 daily & she is rec to do so;  Chems look good...  See prob list below>>   ~06/02/2011 Acute OV  Complains of nausea x1week, constipation and bloating with last BM 5days ago.  was given zofran for the nausea that pt reports has helped. No vomittig or fever. No abdominal pain.  Has had constipation over last 2 weeks. Took senokot s initially with help but caused her nausea.  Stopped taking senokot after 2 days now no BM x 5 days.  No bloody stools or urinary symptoms.  Under a lot of stress with twin sister dying of metastatic cancer.  Problem List:   HEARING LOSS (ICD-389.9) - she wears bilat hearing aides & has been tested by DrPahel...  ALLERGIC RHINITIS (ICD-477.9) - uses OTC antihistamines Prn...  SICK SINUS SYNDROME (ICD-427.81) - s/p pacemaker placed in 1996 for SSS... yearly pacer f/u by DrBrodie- generator changed 10/09 & seen 10/10 doing well... ~  She now follows up w/ DrAllred annually & doing well w/o CP, palpit, dizzy, SOB, edema, etc....  Hx of PERICARDITIS (ICD-423.9) - hosp in 2001 w/ CP & acute pericarditis... serial Echo's showed resolution of the effusion... ~  2DEcho 5/04 showed a prob patent foramen ovale w/ left to right interatrial shunt by doppler... DrBBrodie is aware & following. ~  NuclearStressTest 5/04 was negative- no ischemia, no infarct, EF= 65%... ~  repeat Nuclear Stress Test 1/10 was neg- no scar or ischemia,  EF=74%...  HYPERCHOLESTEROLEMIA (ICD-272.0) - on SIMVASTATIN 20mg /d & low fat diet... ~  FLP 7/08 showed TChol 196, TG 80, HDL 53, LDL 127... ~  FLP 6/09 off med showed TChol 237, TG 106, HDL 57, LDL 147... restart Simva20. ~  FLP 9/09 on Simva20 showed TChol 147, TG 73, HDL 55, LDL 78 ~  FLP 10/10 on Simva20 showed TChol 172, TG 95, HDL 58, LDL 95 ~  FLP 4/12 off meds showed TChol 277, TG 85, HDL 66, LDL 185... rec to restart Simv20 daily... ~  FLP 12/12 on Simva20 showed TChol 242, TG 111, HDL 65, LDL 144... Admits not taking med regularly, advised to take it every day!!!  GERD (ICD-530.81) - uses OTC PRILOSEC 20mg  Prn... ~  4/12:  EGD by DrDBrodie showed HH, esophagitis, mild gastritis, mult tiny gastric polyps, neg HPylori...  DIVERTICULOSIS OF COLON (ICD-562.10) - prev colonoscopy 12/02 by Rodena Medin w/ divertics only... there is a +fam hx of colon cancer in her youngest daughter... f/u colonoscopy is overdue (she refused). ~  4/12: she finally agreed to f/u colon> mild divertics, otherw neg, rec to incr fiber...  IRRITABLE BOWEL SYNDROME (ICD-564.1) - trial BENTYL 20mg  Prn for abd cramping... ~  last saw DrDBrodie for GI 2/10 w/ diarrhea, hx IBS, +FamHx ColonCa>> Rx Flagyl, Probiotic; pt refused colonoscopy. ~  [facility] 1/11 w/ severe gastroenteritis/ diarrhea/ mild dehydration- CDiff neg & Lactoferrin pos- treated w/ Cipro/ Flagyl/ Florastor & resolved.  DEGENERATIVE JOINT DISEASE (ICD-715.90) - she has mild-mod DJD (hands, etc) and Rx w/ Advil, Glucosamine...  Hx of BACK PAIN, LUMBAR (ICD-724.2) - eval by DrWillis in 2005 w/ LBP, some leg paresthesias and MRI showing DDD and mild sp stenosis... Rx'd w/ Aleve and Neurontin at that time...  ANXIETY DEPRESSION (ICD-300.4) - she is quite anxious and uses ATIVAN 1mg - 1/2 to 1 tab Tid Prn + ZOLOFT 50mg - 2 daily (she prev tried weaning off the Zoloft but felt worse off this med)... she notes that depression runs in her family.  ANEMIA  (ICD-285.9) & MONOCLONAL GAMMOPATHY (ICD-273.1) - diagnosed 12/10 as below>> rec to start WOMENS MULTIVIT Daily & VIT B12 daily... 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 we plan f/u B12 & SPE/ IEP... ~  Labs 4/12 Hg= 13.2, B12= 420, SPE no Mspike detected...   Past Surgical History  Procedure Date  . Pacemaker placement     most recent generator replaced 2009 by Dr Juanda Chance  . Appendectomy   . Dilation and curettage of uterus 03/24/10    Outpatient Encounter Prescriptions as  of 06/02/2011  Medication Sig Dispense Refill  . ibuprofen (ADVIL,MOTRIN) 200 MG tablet as needed.        Marland Kitchen LORazepam (ATIVAN) 1 MG tablet Take 1/2 to 1 tab by mouth three times a day as needed for anxiety.  90 tablet  1  . omeprazole (PRILOSEC) 20 MG capsule Take 20 mg by mouth as needed.        . ondansetron (ZOFRAN) 4 MG tablet 1 tablet every 6 hours as needed for nausea  20 tablet  0  . simvastatin (ZOCOR) 20 MG tablet Take 1 tablet (20 mg total) by mouth at bedtime.  90 tablet  1  . aspirin 81 MG tablet Take 81 mg by mouth daily.        . Biotin 1000 MCG tablet Take 1,000 mcg by mouth daily.        . Calcium Carbonate-Vitamin D (CALCIUM-VITAMIN D) 500-200 MG-UNIT per tablet Take 1 tablet by mouth daily.        . Glucosamine 500 MG CAPS Take 2 capsules by mouth daily.        . Multiple Vitamin (MULTIVITAMIN) tablet Take 1 tablet by mouth daily.        . sertraline (ZOLOFT) 50 MG tablet Take 2 tablets (100 mg total) by mouth daily.  60 tablet  5    No Known Allergies   Current Medications, Allergies, Past Medical History, Past Surgical History, Family History, and Social History were reviewed in [employer] record.    Review of Systems       Constitutional:   No  weight loss, night sweats,  Fevers, chills, fatigue, or  lassitude.  HEENT:   No headaches,   Difficulty swallowing,  Tooth/dental problems, or  Sore throat,                No sneezing, itching, ear ache, nasal congestion, post nasal drip,   CV:  No chest pain,  Orthopnea, PND, swelling in lower extremities, anasarca, dizziness, palpitations, syncope.   GI  No heartburn, indigestion, abdominal pain, vomiting, diarrhea,   l , bloody stools.   Resp: No shortness of breath with exertion or at rest.  No excess mucus, no productive cough,  No non-productive cough,  No coughing up of blood.  No change in color of mucus.  No wheezing.  No chest wall deformity  Skin: no rash or lesions.  GU: no dysuria, change in color of urine, no urgency or frequency.  No flank pain, no hematuria   MS:  No joint pain or swelling.  No decreased range of motion.  No back pain.  Psych:  No change in mood or affect.  .  No memory loss.      Objective:   Physical Exam     WD, WN, 78 y/o WF in NAD... GENERAL:  Alert & oriented; pleasant & cooperative... HEENT:  [place]/AT, EOM-wnl, PERRLA, EACs- hearing aides bilat   NOSE-clear, THROAT-clear & wnl. NECK:  Supple w/ full ROM; no JVD; normal carotid impulses w/o bruits; no thyromegaly or nodules palpated; no lymphadenopathy. CHEST:  Clear to P & A; without wheezes/ rales/ or rhonchi. HEART:  Regular Rhythm; without murmurs/ rubs/ or gallops. ABDOMEN:  Soft & nontender; normal bowel sounds; no organomegaly or masses detected. No guarding or rebound.  EXT: without deformities, mild arthritic changes; no varicose veins/ +venous insuffic/ no edema. NEURO:  CN's intact;  no focal neuro deficits... DERM:  No lesions noted; no rash etc..Marland Kitchen  Assessment & Plan:   Patient's Medications  New Prescriptions   No medications on file  Previous Medications   ASPIRIN 81 MG TABLET    Take 81 mg by mouth daily.     BIOTIN 1000 MCG TABLET    Take 1,000 mcg by mouth daily.     CALCIUM CARBONATE-VITAMIN D (CALCIUM-VITAMIN D) 500-200 MG-UNIT PER TABLET    Take 1 tablet  by mouth daily.     GLUCOSAMINE 500 MG CAPS    Take 2 capsules by mouth daily.     IBUPROFEN (ADVIL,MOTRIN) 200 MG TABLET    as needed.     LORAZEPAM (ATIVAN) 1 MG TABLET    Take 1/2 to 1 tab by mouth three times a day as needed for anxiety.   MULTIPLE VITAMIN (MULTIVITAMIN) TABLET    Take 1 tablet by mouth daily.     OMEPRAZOLE (PRILOSEC) 20 MG CAPSULE    Take 20 mg by mouth as needed.     ONDANSETRON (ZOFRAN) 4 MG TABLET    1 tablet every 6 hours as needed for nausea   SERTRALINE (ZOLOFT) 50 MG TABLET    Take 2 tablets (100 mg total) by mouth daily.   SIMVASTATIN (ZOCOR) 20 MG TABLET    Take 1 tablet (20 mg total) by mouth at bedtime.  Modified Medications   No medications on file  Discontinued Medications   No medications on file

## 2011-06-02 NOTE — Assessment & Plan Note (Signed)
Advance bland diet as tolerated.  Gas X with meals.  Miralax 1 capful At bedtime   Colace daily -stool softner for constipation.  Begin high fiber diet  Please contact office for sooner follow up if symptoms do not improve or worsen or seek emergency care  follow up Dr. Kriste Basque  As planned and As needed

## 2011-06-03 ENCOUNTER — Telehealth: Payer: Self-pay | Admitting: Internal Medicine

## 2011-06-03 NOTE — Telephone Encounter (Signed)
Spoke with patient and she states she has had problems with constipation for the last couple of weeks. She has been under a lot of stress lately. States she took [employer] S and "had a blow out: then she has not had any more bowel movements. She also has had some nausea that she took Zofran for and it helped. She did see Dr. Jodelle Green PA yesterday and was told to take Miralax and Colace daily. She has had no results from this. Suggested she take a dose of Miralax now and again tonight if no bowel movement. She will call me tomorrow if this does not produce results. Hx IBS, Last colon- 09/03/10- mild diverticulosis.

## 2011-06-03 NOTE — Telephone Encounter (Signed)
Left a message for patient to call me. 

## 2011-06-03 NOTE — Telephone Encounter (Signed)
Line busy. Will try again later.

## 2011-06-04 NOTE — Telephone Encounter (Signed)
Spoke with patient and gave her Dr. Regino Schultze recommendations. She had a bowel movement after the Miralax doses but still does not feel empty. She will try the Align and prune juice

## 2011-06-04 NOTE — Telephone Encounter (Signed)
If it does not help, try to add Align 1 po qd, prune juice 4 oz qd

## 2011-06-16 ENCOUNTER — Ambulatory Visit (INDEPENDENT_AMBULATORY_CARE_PROVIDER_SITE_OTHER): Payer: 59 | Admitting: Professional

## 2011-06-16 DIAGNOSIS — F4323 Adjustment disorder with mixed anxiety and depressed mood: Secondary | ICD-10-CM

## 2011-06-29 ENCOUNTER — Other Ambulatory Visit: Payer: Self-pay | Admitting: Pulmonary Disease

## 2011-08-19 ENCOUNTER — Other Ambulatory Visit: Payer: Self-pay | Admitting: *Deleted

## 2011-08-19 MED ORDER — LORAZEPAM 1 MG PO TABS: ORAL_TABLET | ORAL | Status: DC

## 2011-08-21 ENCOUNTER — Encounter: Payer: Self-pay | Admitting: Internal Medicine

## 2011-08-21 DIAGNOSIS — I495 Sick sinus syndrome: Secondary | ICD-10-CM

## 2011-08-25 ENCOUNTER — Ambulatory Visit (INDEPENDENT_AMBULATORY_CARE_PROVIDER_SITE_OTHER): Payer: 59 | Admitting: Professional

## 2011-08-25 DIAGNOSIS — F4323 Adjustment disorder with mixed anxiety and depressed mood: Secondary | ICD-10-CM

## 2011-09-08 ENCOUNTER — Ambulatory Visit: Payer: 59 | Admitting: Professional

## 2011-09-10 ENCOUNTER — Encounter: Payer: Self-pay | Admitting: Internal Medicine

## 2011-09-10 ENCOUNTER — Ambulatory Visit (INDEPENDENT_AMBULATORY_CARE_PROVIDER_SITE_OTHER): Payer: Medicare Other | Admitting: *Deleted

## 2011-09-10 DIAGNOSIS — I495 Sick sinus syndrome: Secondary | ICD-10-CM

## 2011-09-10 NOTE — Progress Notes (Signed)
Pacer check in clinic/follwup from TTM

## 2011-09-11 LAB — PACEMAKER DEVICE OBSERVATION
BAMS-0001: 150 {beats}/min
BAMS-0003: 60 {beats}/min
BATTERY VOLTAGE: 2.79 V
RV LEAD AMPLITUDE: 4.1 mv

## 2011-09-21 ENCOUNTER — Telehealth: Payer: Self-pay | Admitting: Internal Medicine

## 2011-09-21 ENCOUNTER — Encounter: Payer: Medicare Other | Admitting: Internal Medicine

## 2011-09-21 NOTE — Telephone Encounter (Signed)
New Problem:    Patient called in because due to Lack of Transportation (Patient's car will not start), she was unable to make her appointment today and is uncomfortable waiting until June 6th to be seen.  Please call back.

## 2011-09-21 NOTE — Telephone Encounter (Signed)
Spoke with pt and made aware that dr allred is fine until June to see pt. Pt understands and will keep appt for 10-15-11 @ 930.

## 2011-09-22 ENCOUNTER — Ambulatory Visit: Payer: Medicare Other | Admitting: Professional

## 2011-10-13 ENCOUNTER — Ambulatory Visit (INDEPENDENT_AMBULATORY_CARE_PROVIDER_SITE_OTHER): Payer: Medicare Other | Admitting: Pulmonary Disease

## 2011-10-13 ENCOUNTER — Other Ambulatory Visit (INDEPENDENT_AMBULATORY_CARE_PROVIDER_SITE_OTHER): Payer: Medicare Other

## 2011-10-13 ENCOUNTER — Encounter: Payer: Self-pay | Admitting: Pulmonary Disease

## 2011-10-13 ENCOUNTER — Ambulatory Visit (INDEPENDENT_AMBULATORY_CARE_PROVIDER_SITE_OTHER): Payer: 59 | Admitting: Professional

## 2011-10-13 VITALS — BP 120/68 | HR 64 | Temp 97.0°F | Ht 63.0 in | Wt 153.2 lb

## 2011-10-13 DIAGNOSIS — D649 Anemia, unspecified: Secondary | ICD-10-CM

## 2011-10-13 DIAGNOSIS — M545 Low back pain: Secondary | ICD-10-CM

## 2011-10-13 DIAGNOSIS — Z950 Presence of cardiac pacemaker: Secondary | ICD-10-CM

## 2011-10-13 DIAGNOSIS — E7800 Pure hypercholesterolemia, unspecified: Secondary | ICD-10-CM

## 2011-10-13 DIAGNOSIS — D472 Monoclonal gammopathy: Secondary | ICD-10-CM

## 2011-10-13 DIAGNOSIS — K5730 Diverticulosis of large intestine without perforation or abscess without bleeding: Secondary | ICD-10-CM

## 2011-10-13 DIAGNOSIS — F341 Dysthymic disorder: Secondary | ICD-10-CM

## 2011-10-13 DIAGNOSIS — K589 Irritable bowel syndrome without diarrhea: Secondary | ICD-10-CM

## 2011-10-13 DIAGNOSIS — F4323 Adjustment disorder with mixed anxiety and depressed mood: Secondary | ICD-10-CM

## 2011-10-13 DIAGNOSIS — I495 Sick sinus syndrome: Secondary | ICD-10-CM

## 2011-10-13 DIAGNOSIS — K219 Gastro-esophageal reflux disease without esophagitis: Secondary | ICD-10-CM

## 2011-10-13 DIAGNOSIS — M1990 Unspecified osteoarthritis, unspecified site: Secondary | ICD-10-CM

## 2011-10-13 LAB — CBC WITH DIFFERENTIAL/PLATELET
Basophils Absolute: 0 10*3/uL (ref 0.0–0.1)
Hemoglobin: 12.6 g/dL (ref 12.0–15.0)
Lymphocytes Relative: 27.2 % (ref 12.0–46.0)
Monocytes Relative: 4.6 % (ref 3.0–12.0)
Neutro Abs: 4.3 10*3/uL (ref 1.4–7.7)
Platelets: 248 10*3/uL (ref 150.0–400.0)
RDW: 13.7 % (ref 11.5–14.6)

## 2011-10-13 MED ORDER — LORAZEPAM 1 MG PO TABS: ORAL_TABLET | ORAL | Status: DC

## 2011-10-13 MED ORDER — SERTRALINE HCL 50 MG PO TABS: ORAL_TABLET | ORAL | Status: DC

## 2011-10-13 NOTE — Progress Notes (Signed)
Subjective:    Patient ID: [name], female    DOB: 1928-09-30, 78 y.o.   MRN: [id number]  HPI 78 y/o WF here for a follow up visit... she has multiple medical problems including:  SSS- s/p pacer insertion;  Hx pericarditis;  Hyperchol;  GERD/ Divertics/ IBS;  DJD/ LBP;  Anxiety/ Depression;  Anemia/ Monoclonal Gammopathy/ Borderline B12 level...  ~  January 31, 2010:  2d hx left ear pain- went to audiologist & sent here for ear infection... min drainage, no fever/ chills/ sweats, no adenopathy... exam shows some exud & inflammed EAC> we discussed Rx w/ Cortisporin Otic + Augmentin orally... she has bilat hearing aides & some further decr hearing noted... she will f/u w/ DrCrossley as needed.  ~  August 14, 2910:  6 month ROV & doing satis she says> still under alot of stress w/ sister's illness etc;  She has Ativan to use Prn but only taking it Qhs & we discussed this...    Hearing Loss>  She has new hearing aides...    SSS>  Followed by DrBB> now DrAllred w/ SSS, palpit, s/p pacer (last generator change 2000);  Last seen 10/11 & stable...    Hx pericarditis>  This occurred in 2001;  Echo in 2004 showed prob patent foramen w/ left to right interatrial shunt by doppler> DrBrodie is aware & following...    Chol>  She has been on Simva20 + FishOil w/ decent numbers, but she stopped on her own- wants to check FLP off meds; TChol 277, TG 85, HDL 66, LDL 185;  REC to restart Rx.    GI>  Uses OTC PPI as needed;  Last colonoscopy 2002 by DrGessner (neg x divertics) & she refuses repeat procedure despite daugh hx colon ca;  She tells me she has appt w/ DrDBrodie soon for incr IBS symptoms & will discuss colonoscopy vs stool cards at that time.    DJD>  She has mild-mod diffuse DJD & uses OTC meds prn...    MGUS>  She has monoclonal IgG kappa paraprot on SPE/IEP 12/10 w/ norm quant immunoglobulins;  B12 was low-normal range at 235;  Due for f/u labs> pending.  ~  April 14, 2011:  76mo ROV & she  presents w/ URI x 1d w/ nasal congestion, drainage- clear watery, cough w/o sput, denis f/c/s, denies SOB> we discussed ZPak & OTC meds for as needed use... she [place] 8/[place] & very happy there...  She saw DrAllred 10/12 for f/u SSS, pacer, palpit & doing well, no changes made;  She also saw DrDBrodie 5/12 w/ GERD/ Divertics/ IBS w/ EGD & Colon earlier this yr; c/o anal leakage most likely due to rectocele & decr sphincter tone- her note is reviewed...  She had recent fasting blood work> FLP reflects the fact that she is not taking the Simva20 daily & she is rec to do so;  Chems look good...  See prob list below>>  ~  October 13, 2011:  54mo ROV & Erinne is stable> still somewhat depressed but on Zoloft & getting counseling; she stopped meds on her own & we discussed this, asked her to restart meds so we could assess their benefit going forward & she understands;  She continues to have pacer checks w/ DrAllred, GI follow ups w/ DrDBrodie...    We reviewed prob list, meds, xrays and labs> see below>> LABS 6/13:  CBC- wnl;  SPE- nonspecif pattern, no M-spike detected.Marland KitchenMarland Kitchen  Problem List:   HEARING LOSS (ICD-389.9) - she wears bilat hearing aides & has been tested by DrPahel...  ALLERGIC RHINITIS (ICD-477.9) - uses OTC antihistamines Prn...  SICK SINUS SYNDROME (ICD-427.81) - s/p pacemaker placed in 1996 for SSS... yearly pacer f/u by DrBrodie- generator changed 10/09 & seen 10/10 doing well... ~  She now follows up w/ DrAllred annually & doing well w/o CP, palpit, dizzy, SOB, edema, etc....  Hx of PERICARDITIS (ICD-423.9) - hosp in 2001 w/ CP & acute pericarditis... serial Echo's showed resolution of the effusion... ~  2DEcho 5/04 showed a prob patent foramen ovale w/ left to right interatrial shunt by doppler... DrBBrodie is aware & following. ~  NuclearStressTest 5/04 was negative- no ischemia, no infarct, EF= 65%... ~  repeat Nuclear Stress Test 1/10 was neg- no scar  or ischemia, EF=74%...  HYPERCHOLESTEROLEMIA (ICD-272.0) - on SIMVASTATIN 20mg /d & low fat diet... ~  FLP 7/08 showed TChol 196, TG 80, HDL 53, LDL 127... ~  FLP 6/09 off med showed TChol 237, TG 106, HDL 57, LDL 147... restart Simva20. ~  FLP 9/09 on Simva20 showed TChol 147, TG 73, HDL 55, LDL 78 ~  FLP 10/10 on Simva20 showed TChol 172, TG 95, HDL 58, LDL 95 ~  FLP 4/12 off meds showed TChol 277, TG 85, HDL 66, LDL 185... rec to restart Simv20 daily... ~  FLP 12/12 on Simva20 showed TChol 242, TG 111, HDL 65, LDL 144... Admits not taking med regularly, advised to take it every day!!!  GERD (ICD-530.81) - uses OTC PRILOSEC 20mg  Prn... ~  4/12:  EGD by DrDBrodie showed HH, esophagitis, mild gastritis, mult tiny gastric polyps, neg HPylori...  DIVERTICULOSIS OF COLON (ICD-562.10) - prev colonoscopy 12/02 by Rodena Medin w/ divertics only... there is a +fam hx of colon cancer in her youngest daughter... f/u colonoscopy is overdue (she refused). ~  4/12: she finally agreed to f/u colon> mild divertics, otherw neg, rec to incr fiber...  IRRITABLE BOWEL SYNDROME (ICD-564.1) - trial BENTYL 20mg  Prn for abd cramping... ~  last saw DrDBrodie for GI 2/10 w/ diarrhea, hx IBS, +FamHx ColonCa>> Rx Flagyl, Probiotic; pt refused colonoscopy. ~  [facility] 1/11 w/ severe gastroenteritis/ diarrhea/ mild dehydration- CDiff neg & Lactoferrin pos- treated w/ Cipro/ Flagyl/ Florastor & resolved.  DEGENERATIVE JOINT DISEASE (ICD-715.90) - she has mild-mod DJD (hands, etc) and Rx w/ Advil, Glucosamine...  Hx of BACK PAIN, LUMBAR (ICD-724.2) - eval by DrWillis in 2005 w/ LBP, some leg paresthesias and MRI showing DDD and mild sp stenosis... Rx'd w/ Aleve and Neurontin at that time...  ANXIETY DEPRESSION (ICD-300.4) - she is quite anxious and uses ATIVAN 1mg - 1/2 to 1 tab Tid Prn + ZOLOFT 50mg - 2 daily (she prev tried weaning off the Zoloft but felt worse off this med)... she notes that depression runs in her  family.  ANEMIA (ICD-285.9) & ?MONOCLONAL GAMMOPATHY? - diagnosed 12/10 as below>> rec to start WOMENS MULTIVIT Daily & VIT B12 daily... 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 we plan f/u B12 & SPE/ IEP... ~  Labs 4/12 Hg= 13.2, B12= 420, SPE no Mspike detected... ~  Labs 6/13 showed Hg= 12.6, SPE- neg, no Mspike & nonspecific pattern...   Past Surgical History  Procedure Date  . Pacemaker placement     most recent generator replaced 2009 by Dr Juanda Chance  . Appendectomy   .  Dilation and curettage of uterus 03/24/10    Outpatient Encounter Prescriptions as of 10/13/2011  Medication Sig Dispense Refill  . aspirin 81 MG tablet Take 81 mg by mouth daily.        . Biotin 1000 MCG tablet Take 1,000 mcg by mouth daily.        . Calcium Carbonate-Vitamin D (CALCIUM-VITAMIN D) 500-200 MG-UNIT per tablet Take 1 tablet by mouth daily.        Marland Kitchen ibuprofen (ADVIL,MOTRIN) 200 MG tablet as needed.        Marland Kitchen LORazepam (ATIVAN) 1 MG tablet Take 1/2 to 1 tab by mouth three times a day as needed for anxiety.  90 tablet  5  . Multiple Vitamin (MULTIVITAMIN) tablet Take 1 tablet by mouth daily.        Marland Kitchen omeprazole (PRILOSEC) 20 MG capsule Take 20 mg by mouth as needed.        . sertraline (ZOLOFT) 50 MG tablet TAKE 2 TABLETS EVERY DAY  180 tablet  3  . simvastatin (ZOCOR) 20 MG tablet Take 1 tablet (20 mg total) by mouth at bedtime.  90 tablet  1  . Glucosamine 500 MG CAPS Take 2 capsules by mouth daily.          No Known Allergies   Current Medications, Allergies, Past Medical History, Past Surgical History, Family History, and Social History were reviewed in [employer] record.    Review of Systems        See HPI - all other systems are neg except as noted... The patient complains of decreased hearing.  The patient denies anorexia, fever, weight loss,  weight gain, vision loss, hoarseness, chest pain, syncope, dyspnea on exertion, peripheral edema, prolonged cough, headaches, hemoptysis, abdominal pain, melena, hematochezia, severe indigestion/heartburn, hematuria, incontinence, muscle weakness, suspicious skin lesions, transient blindness, difficulty walking, depression, unusual weight change, abnormal bleeding, enlarged lymph nodes, and angioedema.     Objective:   Physical Exam     WD, WN, 78 y/o WF in NAD... GENERAL:  Alert & oriented; pleasant & cooperative... HEENT:  [place]/AT, EOM-wnl, PERRLA, EACs- hearing aides bilat & left EAC exud/ sl red/ inflammed, NOSE-clear, THROAT-clear & wnl. NECK:  Supple w/ full ROM; no JVD; normal carotid impulses w/o bruits; no thyromegaly or nodules palpated; no lymphadenopathy. CHEST:  Clear to P & A; without wheezes/ rales/ or rhonchi. HEART:  Regular Rhythm; without murmurs/ rubs/ or gallops. ABDOMEN:  Soft & nontender; normal bowel sounds; no organomegaly or masses detected. EXT: without deformities, mild arthritic changes; no varicose veins/ +venous insuffic/ no edema. NEURO:  CN's intact;  no focal neuro deficits... DERM:  No lesions noted; no rash etc...  RADIOLOGY DATA:  Reviewed in the EPIC EMR & discussed w/ the patient...  LABORATORY DATA:  Reviewed in the EPIC EMR & discussed w/ the patient...   Assessment & Plan:   Hearing Loss>  She has bilat hearing aides...  Cardiac> SSS, Pacer, Hx pericarditis>  Followed by DrAllred & pacer working fine, denies arrhythmias, palpit, etc...  Hyperchol>  On Simva20 but NOT taking regularly; asked to take it every day so we can assess it's efficacy, may need more expensive alternative drug...  HH/ GERD>  S/p EGD 4/12 by DrDBrodie; pt taking OTC Prilosec daily...  Divertics/ IBS/ leakage>  Colonoscopy 4/12 was essent neg, no polyps; leakage from rectocele & managed by GI- DrBrodie...  DJD/ LBP>  She has seen DrWillis in the past; taking Advil,  Tylenol, OTC  meds...  Anxiety/ Depression>  On Zoloft & Ativan...  ?MONOCLONAL GAMMOPATHY? - MGUS detected 12/10, but all studies have been neg since then...   Patient's Medications  New Prescriptions   No medications on file  Previous Medications   ASPIRIN 81 MG TABLET    Take 81 mg by mouth daily.     BIOTIN 1000 MCG TABLET    Take 1,000 mcg by mouth daily.     CALCIUM CARBONATE-VITAMIN D (CALCIUM-VITAMIN D) 500-200 MG-UNIT PER TABLET    Take 1 tablet by mouth daily.     GLUCOSAMINE 500 MG CAPS    Take 2 capsules by mouth daily.     IBUPROFEN (ADVIL,MOTRIN) 200 MG TABLET    as needed.     LORAZEPAM (ATIVAN) 1 MG TABLET    Take 1/2 to 1 tab by mouth three times a day as needed for anxiety.   MULTIPLE VITAMIN (MULTIVITAMIN) TABLET    Take 1 tablet by mouth daily.     OMEPRAZOLE (PRILOSEC) 20 MG CAPSULE    Take 20 mg by mouth as needed.     SERTRALINE (ZOLOFT) 50 MG TABLET    TAKE 2 TABLETS EVERY DAY   SIMVASTATIN (ZOCOR) 20 MG TABLET    Take 1 tablet (20 mg total) by mouth at bedtime.  Modified Medications   No medications on file  Discontinued Medications   No medications on file

## 2011-10-13 NOTE — Patient Instructions (Signed)
Today we updated your med list in our EPIC system...    Continue your current medications the same...    We refilled your meds today...  Be sure to take your meds REGULARLY (including the Simvastatin & Aspirin)...  Today we did your follow up blood count & serum protein test (we do this once per year to monitor your bone marrow)...    We will call you w/ the result when avail...  Call for any questions...  Let's plan a follow up visit in 6 months.Marland KitchenMarland Kitchen

## 2011-10-15 ENCOUNTER — Encounter: Payer: Self-pay | Admitting: Internal Medicine

## 2011-10-15 ENCOUNTER — Ambulatory Visit (INDEPENDENT_AMBULATORY_CARE_PROVIDER_SITE_OTHER): Payer: Medicare Other | Admitting: Internal Medicine

## 2011-10-15 VITALS — BP 128/74 | HR 72 | Ht 63.5 in | Wt 155.0 lb

## 2011-10-15 DIAGNOSIS — I495 Sick sinus syndrome: Secondary | ICD-10-CM

## 2011-10-15 LAB — PROTEIN ELECTROPHORESIS, SERUM, WITH REFLEX
Albumin ELP: 58.3 % (ref 55.8–66.1)
Alpha-1-Globulin: 4.3 % (ref 2.9–4.9)
Alpha-2-Globulin: 12.9 % — ABNORMAL HIGH (ref 7.1–11.8)
Total Protein, Serum Electrophoresis: 6.7 g/dL (ref 6.0–8.3)

## 2011-10-15 LAB — PACEMAKER DEVICE OBSERVATION
AL THRESHOLD: 3.25 V
ATRIAL PACING PM: 1
BAMS-0003: 60 {beats}/min
DEVICE MODEL PM: 1234823
RV LEAD THRESHOLD: 0.875 V

## 2011-10-15 NOTE — Progress Notes (Signed)
PCP: Michele Mcalpine, MD, MD  The patient presents today for routine electrophysiology followup.  Since last being seen in our clinic, the patient reports doing very well.  She remains very active for her age. Today, she denies symptoms of palpitations, chest pain, shortness of breath, orthopnea, PND, lower extremity edema, dizziness, presyncope, syncope, or neurologic sequela.  The patient feels that she is tolerating medications without difficulties and is otherwise without complaint today.   Past Medical History  Diagnosis Date  . Hearing loss   . Allergic rhinitis   . Sick sinus syndrome   . Presence of permanent cardiac pacemaker   . History of pericarditis   . Hypercholesteremia   . GERD (gastroesophageal reflux disease)   . IBS (irritable bowel syndrome)   . Diverticulosis of colon   . DJD (degenerative joint disease)   . Lumbar back pain   . Anxiety and depression   . Anemia   . Monoclonal gammopathy   . Anxiety   . Cataract   . Depression   . Osteoporosis     osteopenia  . Hiatal hernia   . Gastric polyps    Past Surgical History  Procedure Date  . Pacemaker placement     most recent generator replaced 2009 by Dr Juanda Chance  . Appendectomy   . Dilation and curettage of uterus 03/24/10    Current Outpatient Prescriptions  Medication Sig Dispense Refill  . aspirin 81 MG tablet Take 81 mg by mouth daily.        . Biotin 1000 MCG tablet Take 1,000 mcg by mouth daily.        . Calcium Carbonate-Vitamin D (CALCIUM-VITAMIN D) 500-200 MG-UNIT per tablet Take 1 tablet by mouth daily.        . Glucosamine 500 MG CAPS Take 2 capsules by mouth daily.        Marland Kitchen ibuprofen (ADVIL,MOTRIN) 200 MG tablet as needed.        Marland Kitchen LORazepam (ATIVAN) 1 MG tablet Take 1/2 to 1 tab by mouth three times a day as needed for anxiety.  90 tablet  5  . Multiple Vitamin (MULTIVITAMIN) tablet Take 1 tablet by mouth daily.        Marland Kitchen omeprazole (PRILOSEC) 20 MG capsule Take 20 mg by mouth as needed.          . sertraline (ZOLOFT) 50 MG tablet Take 50 mg by mouth daily. Take 3 tablets by mouth every day      . simvastatin (ZOCOR) 20 MG tablet Take 1 tablet (20 mg total) by mouth at bedtime.  90 tablet  1  . DISCONTD: sertraline (ZOLOFT) 50 MG tablet Take 2 tablets by mouth every day  180 tablet  3    No Known Allergies  History   Social History  . Marital Status: Widowed    Spouse Name: N/A    Number of Children: 3  . Years of Education: N/A   Occupational History  .     Social History Main Topics  . Smoking status: Former Smoker -- 10 years    Types: Cigarettes    Quit date: 05/12/1987  . Smokeless tobacco: Never Used  . Alcohol Use: No     wine occasionally  . Drug Use: No  . Sexually Active: Not on file   Other Topics Concern  . Not on file   Social History Narrative   Pt has 12 siblings.Alcohol use- noDrug use- noDaily caffeine use    Family History  Problem  Relation Age of Onset  . Heart failure Mother   . Heart attack Father   . Parkinsonism Sister   . Rectal cancer Daughter   . Colon cancer Daughter   . Pancreatic cancer Brother   . Pancreatic cancer Sister   . Brain cancer Brother   . Heart disease Brother   . Colon cancer Daughter   . Diabetes Sister   . Clotting disorder Sister   . Lung cancer Sister     twin  . Crohn's disease Sister     twin    Physical Exam: Filed Vitals:   10/15/11 0950  BP: 128/74  Pulse: 72  Height: 5' 3.5" (1.613 m)  Weight: 155 lb (70.308 kg)    GEN- The patient is well appearing, alert and oriented x 3 today.   Head- normocephalic, atraumatic Eyes-  Sclera clear, conjunctiva pink Ears- hearing intact Oropharynx- clear Neck- supple, no JVP Lymph- no cervical lymphadenopathy Lungs- Clear to ausculation bilaterally, normal work of breathing Chest- pacemaker pocket is well healed Heart- Regular rate and rhythm, no murmurs, rubs or gallops, PMI not laterally displaced GI- soft, NT, ND, + BS Extremities- no  clubbing, cyanosis, or edema  Pacemaker interrogation- reviewed in detail today,  See PACEART report  Assessment and Plan:

## 2011-10-15 NOTE — Patient Instructions (Signed)
Your physician wants you to follow-up in: 1 year with Dr. Johney Frame.  You will receive a reminder letter in the mail two months in advance. If you don't receive a letter, please call our office to schedule the follow-up appointment.  Your physician wants you to follow-up in: 6 months with the device clinic. You will receive a reminder letter in the mail two months in advance. If you don't receive a letter, please call our office to schedule the follow-up appointment.

## 2011-10-15 NOTE — Assessment & Plan Note (Signed)
Normal pacemaker function.  She does not pace.  She has decreased impedance and increased threshold on her atrial lead.  Given her infrequent pacing, I would not anticipate that she will require lead revision.  If her atrial lead fails, then I would likely program VVI 40 at that point.  She has had some pectoralis stim with unipolar atrial pacing and therefore this is not an option. I would reserve lead replacement for symptomatic bradycardia with loss of AV synchrony because of V pacing which I think is very unlikely to happy. Her initial pacing indication was for a single post operative sinus pause which may have actually been a vagal event. See Arita Miss Art report No changes today

## 2011-10-18 ENCOUNTER — Encounter: Payer: Self-pay | Admitting: Pulmonary Disease

## 2011-10-27 ENCOUNTER — Ambulatory Visit (INDEPENDENT_AMBULATORY_CARE_PROVIDER_SITE_OTHER): Payer: 59 | Admitting: Professional

## 2011-10-27 DIAGNOSIS — F4323 Adjustment disorder with mixed anxiety and depressed mood: Secondary | ICD-10-CM

## 2011-11-05 ENCOUNTER — Telehealth: Payer: Self-pay | Admitting: Pulmonary Disease

## 2011-11-05 MED ORDER — NEOMYCIN-POLYMYXIN-GRAMICIDIN 1.75-10000-.025 OP SOLN: OPHTHALMIC | Status: DC

## 2011-11-05 NOTE — Telephone Encounter (Signed)
Spoke with pt. She states when woke up this am "eyes glued shut from crust". She states that both eyes are very red, and rt eye seems more red and also itches. Would like something called in. Please advise, thanks!

## 2011-11-05 NOTE — Telephone Encounter (Signed)
Pt at pharmacy waiting on rx now. I advised her (again) that nurse would call to speak to her before calling in any rx or advise re: recommendations. She is going back home now but can be reached on her cell [phone]. [name]

## 2011-11-05 NOTE — Telephone Encounter (Signed)
Rx was sent to pharm and [facility] for the pt to be made aware.

## 2011-11-05 NOTE — Telephone Encounter (Signed)
Per SN---ok to call in neosporin opthamlic suspension   2 drops in affected eye tid.  Called and lmom for the pt to call me back.

## 2011-11-10 ENCOUNTER — Ambulatory Visit (INDEPENDENT_AMBULATORY_CARE_PROVIDER_SITE_OTHER): Payer: 59 | Admitting: Professional

## 2011-11-10 DIAGNOSIS — F4323 Adjustment disorder with mixed anxiety and depressed mood: Secondary | ICD-10-CM

## 2011-11-20 ENCOUNTER — Encounter: Payer: Self-pay | Admitting: Internal Medicine

## 2011-11-20 DIAGNOSIS — I495 Sick sinus syndrome: Secondary | ICD-10-CM

## 2011-11-24 ENCOUNTER — Ambulatory Visit: Payer: 59 | Admitting: Professional

## 2011-12-02 ENCOUNTER — Encounter: Payer: Self-pay | Admitting: Internal Medicine

## 2011-12-08 ENCOUNTER — Ambulatory Visit: Payer: 59 | Admitting: Professional

## 2011-12-16 ENCOUNTER — Telehealth: Payer: Self-pay | Admitting: Internal Medicine

## 2011-12-16 ENCOUNTER — Ambulatory Visit (INDEPENDENT_AMBULATORY_CARE_PROVIDER_SITE_OTHER): Payer: Medicare Other | Admitting: *Deleted

## 2011-12-16 ENCOUNTER — Encounter: Payer: Self-pay | Admitting: Internal Medicine

## 2011-12-16 DIAGNOSIS — F341 Dysthymic disorder: Secondary | ICD-10-CM

## 2011-12-16 LAB — PACEMAKER DEVICE OBSERVATION
AL IMPEDENCE PM: 274 Ohm
AL THRESHOLD: 3.5 V
ATRIAL PACING PM: 1.8
BATTERY VOLTAGE: 2.79 V
DEVICE MODEL PM: 1234823
RV LEAD AMPLITUDE: 5 mv
RV LEAD IMPEDENCE PM: 497 Ohm

## 2011-12-16 NOTE — Telephone Encounter (Signed)
Started yesterday or the day before.  It is worse then she walks.

## 2011-12-16 NOTE — Progress Notes (Signed)
Pacer check in clinic  

## 2011-12-16 NOTE — Telephone Encounter (Signed)
Pt has been feeling dizzy for a couple of days and she feel a lil nausea not much and she was wondering if had to have to do with her heart

## 2011-12-16 NOTE — Telephone Encounter (Signed)
Her device looks good and she is going to follow up with her PCP for the dizziness

## 2011-12-16 NOTE — Telephone Encounter (Signed)
Spoke with patient and let her know she can come in today at 2pm to have her device interrogated.  She  is going to come in at 2pm  If all is good she is going to follow up with her PCP for her dizziness

## 2012-02-11 ENCOUNTER — Telehealth: Payer: Self-pay | Admitting: Pulmonary Disease

## 2012-02-11 DIAGNOSIS — N390 Urinary tract infection, site not specified: Secondary | ICD-10-CM

## 2012-02-11 MED ORDER — CIPROFLOXACIN HCL 250 MG PO TABS: 250.0000 mg | ORAL_TABLET | Freq: Two times a day (BID) | ORAL | Status: DC

## 2012-02-11 NOTE — Telephone Encounter (Signed)
LMOMTCB x 1. RX sent to [facility] on [address].

## 2012-02-11 NOTE — Telephone Encounter (Signed)
Pt c/o urinary frequency with a burning sensation on the outside. She says it looks red as fire and itches. She has tried using an abx cream she had but could not tell me the name of it. The cream is not helping. Pt says this has gradually gotten worse over the past 2 weeks. Pls advise.No Known Allergies

## 2012-02-11 NOTE — Telephone Encounter (Signed)
Per SN---will need to see GYN to check this red and fire area.   recs to use sitz baths and ok to call in cipro  250 mg  #14  1 po bid and if she would like to come in and leave a specimen we can check it for ua and urine  C & S.  thanks

## 2012-02-15 NOTE — Telephone Encounter (Signed)
I spoke with pt and is aware of SN recs. She stated she would like to come and leave specimen sample in the lab. I have placed order. Nothing further was needed

## 2012-02-16 ENCOUNTER — Other Ambulatory Visit (INDEPENDENT_AMBULATORY_CARE_PROVIDER_SITE_OTHER): Payer: Medicare Other

## 2012-02-16 DIAGNOSIS — N390 Urinary tract infection, site not specified: Secondary | ICD-10-CM

## 2012-02-16 LAB — URINALYSIS, ROUTINE W REFLEX MICROSCOPIC
Bilirubin Urine: NEGATIVE
Ketones, ur: NEGATIVE
Nitrite: NEGATIVE
Total Protein, Urine: NEGATIVE
Urine Glucose: NEGATIVE
pH: 5.5 (ref 5.0–8.0)

## 2012-02-19 DIAGNOSIS — I495 Sick sinus syndrome: Secondary | ICD-10-CM

## 2012-03-01 ENCOUNTER — Telehealth: Payer: Self-pay | Admitting: Pulmonary Disease

## 2012-03-01 MED ORDER — SOLIFENACIN SUCCINATE 5 MG PO TABS: 5.0000 mg | ORAL_TABLET | Freq: Every day | ORAL | Status: DC

## 2012-03-01 NOTE — Telephone Encounter (Signed)
I spoke with pt and she stated she would like to try the vesicare. I have sent this to the pharmacy and pt is aware of directions. Nothing further was needed

## 2012-03-01 NOTE — Telephone Encounter (Signed)
Per SN---she can try med or we can refer her to urology for further eval----if she would like to try something---call in vesicare 5 mg  1 daily.  thanks

## 2012-03-01 NOTE — Telephone Encounter (Signed)
Called and spoke with pt about her ua and culture results.  Pt is aware of these results and of SN recs. Pt c/o urinary urgency and that if she is not near the restroom when she has to go then she may wet herself. Wanted to know any recs from SN.  Please advise. Thanks  No Known Allergies

## 2012-04-13 ENCOUNTER — Other Ambulatory Visit: Payer: Self-pay | Admitting: Pulmonary Disease

## 2012-04-14 ENCOUNTER — Other Ambulatory Visit: Payer: Self-pay | Admitting: Pulmonary Disease

## 2012-04-14 ENCOUNTER — Encounter: Payer: Self-pay | Admitting: Pulmonary Disease

## 2012-04-14 ENCOUNTER — Ambulatory Visit (INDEPENDENT_AMBULATORY_CARE_PROVIDER_SITE_OTHER): Payer: Medicare Other | Admitting: Pulmonary Disease

## 2012-04-14 VITALS — BP 142/80 | HR 72 | Temp 96.3°F | Ht 63.0 in | Wt 160.6 lb

## 2012-04-14 DIAGNOSIS — K5730 Diverticulosis of large intestine without perforation or abscess without bleeding: Secondary | ICD-10-CM

## 2012-04-14 DIAGNOSIS — D649 Anemia, unspecified: Secondary | ICD-10-CM

## 2012-04-14 DIAGNOSIS — M545 Low back pain: Secondary | ICD-10-CM

## 2012-04-14 DIAGNOSIS — I495 Sick sinus syndrome: Secondary | ICD-10-CM

## 2012-04-14 DIAGNOSIS — Z950 Presence of cardiac pacemaker: Secondary | ICD-10-CM

## 2012-04-14 DIAGNOSIS — K589 Irritable bowel syndrome without diarrhea: Secondary | ICD-10-CM

## 2012-04-14 DIAGNOSIS — F341 Dysthymic disorder: Secondary | ICD-10-CM

## 2012-04-14 DIAGNOSIS — E7800 Pure hypercholesterolemia, unspecified: Secondary | ICD-10-CM

## 2012-04-14 DIAGNOSIS — M1990 Unspecified osteoarthritis, unspecified site: Secondary | ICD-10-CM

## 2012-04-14 DIAGNOSIS — K219 Gastro-esophageal reflux disease without esophagitis: Secondary | ICD-10-CM

## 2012-04-14 DIAGNOSIS — R413 Other amnesia: Secondary | ICD-10-CM

## 2012-04-14 MED ORDER — DONEPEZIL HCL 10 MG PO TABS: ORAL_TABLET | ORAL | Status: DC

## 2012-04-14 MED ORDER — SIMVASTATIN 20 MG PO TABS: 20.0000 mg | ORAL_TABLET | Freq: Every day | ORAL | Status: DC

## 2012-04-14 MED ORDER — OMEPRAZOLE 20 MG PO CPDR: 20.0000 mg | DELAYED_RELEASE_CAPSULE | ORAL | Status: DC | PRN

## 2012-04-14 NOTE — Progress Notes (Addendum)
Subjective:    Patient ID: [name], female    DOB: 27-Nov-1928, 78 y.o.   MRN: [id number]  HPI 78 y/o WF here for a follow up visit... she has multiple medical problems including:  SSS- s/p pacer insertion;  Hx pericarditis;  Hyperchol;  GERD/ Divertics/ IBS;  DJD/ LBP;  Anxiety/ Depression;  Anemia/ Monoclonal Gammopathy/ Borderline B12 level...  ~  January 31, 2010:  2d hx left ear pain- went to audiologist & sent here for ear infection... min drainage, no fever/ chills/ sweats, no adenopathy... exam shows some exud & inflammed EAC> we discussed Rx w/ Cortisporin Otic + Augmentin orally... she has bilat hearing aides & some further decr hearing noted... she will f/u w/ DrCrossley as needed.  ~  August 14, 2910:  6 month ROV & doing satis she says> still under alot of stress w/ sister's illness etc;  She has Ativan to use Prn but only taking it Qhs & we discussed this...    Hearing Loss>  She has new hearing aides...    SSS>  Followed by DrBB> now DrAllred w/ SSS, palpit, s/p pacer (last generator change 2000);  Last seen 10/11 & stable...    Hx pericarditis>  This occurred in 2001;  Echo in 2004 showed prob patent foramen w/ left to right interatrial shunt by doppler> DrBrodie is aware & following...    Chol>  She has been on Simva20 + FishOil w/ decent numbers, but she stopped on her own- wants to check FLP off meds; TChol 277, TG 85, HDL 66, LDL 185;  REC to restart Rx.    GI>  Uses OTC PPI as needed;  Last colonoscopy 2002 by DrGessner (neg x divertics) & she refuses repeat procedure despite daugh hx colon ca;  She tells me she has appt w/ DrDBrodie soon for incr IBS symptoms & will discuss colonoscopy vs stool cards at that time.    DJD>  She has mild-mod diffuse DJD & uses OTC meds prn...    MGUS>  She has monoclonal IgG kappa paraprot on SPE/IEP 12/10 w/ norm quant immunoglobulins;  B12 was low-normal range at 235;  Due for f/u labs> pending.  ~  April 14, 2011:  76mo ROV & she  presents w/ URI x 1d w/ nasal congestion, drainage- clear watery, cough w/o sput, denis f/c/s, denies SOB> we discussed ZPak & OTC meds for as needed use... she [place] 8/12 & very happy there...  She saw DrAllred 10/12 for f/u SSS, pacer, palpit & doing well, no changes made;  She also saw DrDBrodie 5/12 w/ GERD/ Divertics/ IBS w/ EGD & Colon earlier this yr; c/o anal leakage most likely due to rectocele & decr sphincter tone- her note is reviewed...  She had recent fasting blood work> FLP reflects the fact that she is not taking the Simva20 daily & she is rec to do so;  Chems look good...  See prob list below>>  ~  October 13, 2011:  76mo ROV & Karigan is stable> still somewhat depressed but on Zoloft & getting counseling; she stopped meds on her own & we discussed this, asked her to restart meds so we could assess their benefit going forward & she understands;  She continues to have pacer checks w/ DrAllred, GI follow ups w/ DrDBrodie...    We reviewed prob list, meds, xrays and labs> see below>> LABS 6/13:  CBC- wnl;  SPE- nonspecif pattern, no M-spike detected...   ~  April 14, 2012:  76mo ROV & daughter [name] spoke to me in private about pt's worsening memory, losing things, repeating herself, medication errors, etc;  We discussed starting Aricept & refer to Neuro for full eval & their suggestions... We reviewed the following problems during today's visit>>    SSS, Pacer> on ASA81; followed by DrAllred & last seen 6/13- doing well clinically, pacer funct normally but not pacing (see his note, reviewed)...    Chol> she stopped Simva on her own, but agrees to restart Simva20; last FLP 12/12 (on diet alone) showed TChol 242, TG 111, HDL 65, LDL 144    GI- GERD, Divertics, IBS> on Omep20 but not taking daily; last EGD/ Colon 4/12- she notes occas reflux sx but denies abd pain, n/v, c/d, blood seen...    DJD, LBP> on OTC analgesics, calcium, MVI, VitD;  She denies specific  arthritic complaints...    Early Dementia, HOH> we decided to start Aricept 5=>10mg /d & refer to Neurology for full eval...    Anxiety, Depression> followed by [facility] on Zoloft50-3/d & Ativan1mg  prn; states she's doing OK...    Anemia & ?MGUS> we've been checking CBC & SPE/IEP every June; last Hg=12.6 Jun2013 & SPE was neg w/o Mspike... We reviewed prob list, meds, xrays and labs> see below for updates >> she had the Flu vaccine 10/13...  ADDENDUM 07/13/12>> Fasting labs showed FLP at goals on simva20, continue same;  Chems- wnl;  CBC- wnl;  TSH=1.72          Problem List:   HEARING LOSS (ICD-389.9) - she wears bilat hearing aides & has been tested by DrPahel...  ALLERGIC RHINITIS (ICD-477.9) - uses OTC antihistamines Prn...  SICK SINUS SYNDROME (ICD-427.81) - s/p pacemaker placed in 1996 for SSS... yearly pacer f/u by DrBrodie- generator changed 10/09 & seen 10/10 doing well... ~  She now follows up w/ DrAllred annually & doing well w/o CP, palpit, dizzy, SOB, edema, etc.... ~  6/13: she had f/u DrAllred> doing well, remains active, denies CP/palpit/SOB/syncope/etc; pacer function normally but atrial lead dysfunction, they are following...  Hx of PERICARDITIS (ICD-423.9) - hosp in 2001 w/ CP & acute pericarditis... serial Echo's showed resolution of the effusion... ~  2DEcho 5/04 showed a prob patent foramen ovale w/ left to right interatrial shunt by doppler... DrBBrodie is aware & following. ~  NuclearStressTest 5/04 was negative- no ischemia, no infarct, EF= 65%... ~  repeat Nuclear Stress Test 1/10 was neg- no scar or ischemia, EF=74%...  HYPERCHOLESTEROLEMIA (ICD-272.0) - on SIMVASTATIN 20mg /d & low fat diet... ~  FLP 7/08 showed TChol 196, TG 80, HDL 53, LDL 127... ~  FLP 6/09 off med showed TChol 237, TG 106, HDL 57, LDL 147... restart Simva20. ~  FLP 9/09 on Simva20 showed TChol 147, TG 73, HDL 55, LDL 78 ~  FLP 10/10 on Simva20 showed TChol 172, TG 95, HDL 58, LDL  95 ~  FLP 4/12 off meds showed TChol 277, TG 85, HDL 66, LDL 185... rec to restart Simv20 daily... ~  FLP 12/12 on Simva20 showed TChol 242, TG 111, HDL 65, LDL 144... Admits not taking med regularly, advised to take it every day!!! ~  12/13:  She is not taking her statin; she agrees to restart Simva20 & will recheck FLP in 2-3 mo...  GERD (ICD-530.81) - uses OTC PRILOSEC 20mg  Prn... ~  4/12:  EGD by DrDBrodie showed HH, esophagitis, mild gastritis, mult tiny gastric polyps, neg HPylori...  DIVERTICULOSIS OF COLON (ICD-562.10) -  prev colonoscopy 12/02 by Rodena Medin w/ divertics only... there is a +fam hx of colon cancer in her youngest daughter... f/u colonoscopy is overdue (she refused). ~  4/12: she finally agreed to f/u colon, done by DrDBrodie> mild divertics, otherw neg, rec to incr fiber...  IRRITABLE BOWEL SYNDROME (ICD-564.1) - trial BENTYL 20mg  Prn for abd cramping... ~  last saw DrDBrodie for GI 2/10 w/ diarrhea, hx IBS, +FamHx ColonCa>> Rx Flagyl, Probiotic; pt refused colonoscopy. ~  [facility] 1/11 w/ severe gastroenteritis/ diarrhea/ mild dehydration- CDiff neg & Lactoferrin pos- treated w/ Cipro/ Flagyl/ Florastor & resolved.  DEGENERATIVE JOINT DISEASE (ICD-715.90) - she has mild-mod DJD (hands, etc) and Rx w/ Advil, Glucosamine...  Hx of BACK PAIN, LUMBAR (ICD-724.2) - eval by DrWillis in 2005 w/ LBP, some leg paresthesias and MRI showing DDD and mild sp stenosis... Rx'd w/ Aleve and Neurontin at that time...  ANXIETY DEPRESSION (ICD-300.4) - she is quite anxious and uses ATIVAN 1mg - 1/2 to 1 tab Tid Prn + ZOLOFT 50mg - 2 daily (she prev tried weaning off the Zoloft but felt worse off this med)... she notes that depression runs in her family.  ANEMIA (ICD-285.9) & ?MONOCLONAL GAMMOPATHY? - diagnosed 12/10 as below>> rec to start WOMENS MULTIVIT Daily & VIT B12 daily... ~  labs 12/10 showed Hg= 12.8, MCV=94, Fe= 83, B12= 235 (211-911), Folate= 9.1.Marland KitchenMarland Kitchen SPE/ IEP showed monoclonal  IgG kappa protein & normal Quant immunoglobulins... we plan f/u B12 & SPE/ IEP... ~  Labs 4/12 Hg= 13.2, B12= 420, SPE no Mspike detected... ~  Labs 6/13 showed Hg= 12.6, SPE- neg, no Mspike & nonspecific pattern...   Past Surgical History  Procedure Date  . Pacemaker placement     most recent generator replaced 2009 by Dr Juanda Chance  . Appendectomy   . Dilation and curettage of uterus 03/24/10    Outpatient Encounter Prescriptions as of 04/14/2012  Medication Sig Dispense Refill  . aspirin 81 MG tablet Take 81 mg by mouth daily.        . Biotin 1000 MCG tablet Take 1,000 mcg by mouth daily.        Marland Kitchen ibuprofen (ADVIL,MOTRIN) 200 MG tablet as needed.        . Calcium Carbonate-Vitamin D (CALCIUM-VITAMIN D) 500-200 MG-UNIT per tablet Take 1 tablet by mouth daily.        . Glucosamine 500 MG CAPS Take 2 capsules by mouth daily.        Marland Kitchen LORazepam (ATIVAN) 1 MG tablet TAKE 1/2 TO 1 TABLET BY MOUTH 3 TIMES DAILY AS NEEDED FOR ANXIETY  90 tablet  0  . Multiple Vitamin (MULTIVITAMIN) tablet Take 1 tablet by mouth daily.        Marland Kitchen neomycin-polymyxin-gramicidin (NEOSPORIN) ophthalmic solution 2 drops to affected eye three times daily  10 mL  0  . omeprazole (PRILOSEC) 20 MG capsule Take 20 mg by mouth as needed.        . sertraline (ZOLOFT) 50 MG tablet Take 50 mg by mouth daily. Take 3 tablets by mouth every day      . simvastatin (ZOCOR) 20 MG tablet Take 1 tablet (20 mg total) by mouth at bedtime.  90 tablet  1  . solifenacin (VESICARE) 5 MG tablet Take 1 tablet (5 mg total) by mouth daily.  30 tablet  0  . [DISCONTINUED] ciprofloxacin (CIPRO) 250 MG tablet Take 1 tablet (250 mg total) by mouth 2 (two) times daily.  14 tablet  0    No  Known Allergies   Current Medications, Allergies, Past Medical History, Past Surgical History, Family History, and Social History were reviewed in [employer] record.    Review of Systems        See HPI - all other systems are neg except  as noted... The patient complains of decreased hearing.  The patient denies anorexia, fever, weight loss, weight gain, vision loss, hoarseness, chest pain, syncope, dyspnea on exertion, peripheral edema, prolonged cough, headaches, hemoptysis, abdominal pain, melena, hematochezia, severe indigestion/heartburn, hematuria, incontinence, muscle weakness, suspicious skin lesions, transient blindness, difficulty walking, depression, unusual weight change, abnormal bleeding, enlarged lymph nodes, and angioedema.     Objective:   Physical Exam     WD, WN, 78 y/o WF in NAD... GENERAL:  Alert & oriented; pleasant & cooperative... HEENT:  [place]/AT, EOM-wnl, PERRLA, EACs- hearing aides bilat & left EAC exud/ sl red/ inflammed, NOSE-clear, THROAT-clear & wnl. NECK:  Supple w/ full ROM; no JVD; normal carotid impulses w/o bruits; no thyromegaly or nodules palpated; no lymphadenopathy. CHEST:  Clear to P & A; without wheezes/ rales/ or rhonchi. HEART:  Regular Rhythm; without murmurs/ rubs/ or gallops. ABDOMEN:  Soft & nontender; normal bowel sounds; no organomegaly or masses detected. EXT: without deformities, mild arthritic changes; no varicose veins/ +venous insuffic/ no edema. NEURO:  CN's intact;  no focal neuro deficits... DERM:  No lesions noted; no rash etc...  RADIOLOGY DATA:  Reviewed in the EPIC EMR & discussed w/ the patient...  LABORATORY DATA:  Reviewed in the EPIC EMR & discussed w/ the patient...   Assessment & Plan:    Hearing Loss>  She has bilat hearing aides...  Cardiac> SSS, Pacer, Hx pericarditis>  Followed by DrAllred & pacer working fine, denies arrhythmias, palpit, etc...  Hyperchol>  On Simva20 but NOT taking regularly; asked to take it every day so we can assess it's efficacy, may need more expensive alternative drug...  HH/ GERD>  S/p EGD 4/12 by DrDBrodie; pt taking OTC Prilosec daily...  Divertics/ IBS/ leakage>  Colonoscopy 4/12 was essent neg, no polyps; leakage from  rectocele & managed by GI- DrBrodie...  DJD/ LBP>  She has seen DrWillis in the past; taking Advil, Tylenol, OTC meds...  Anxiety/ Depression>  On Zoloft & Ativan...  ?MONOCLONAL GAMMOPATHY? - MGUS detected 12/10, but all studies have been neg since then...   Patient's Medications  New Prescriptions   DONEPEZIL (ARICEPT) 10 MG TABLET    Take 1/2 tablet by mouth daily x 3 weeks then increase to 1 tablet daily  Previous Medications   ASPIRIN 81 MG TABLET    Take 81 mg by mouth daily.     BIOTIN 1000 MCG TABLET    Take 1,000 mcg by mouth daily.     CALCIUM CARBONATE-VITAMIN D (CALCIUM-VITAMIN D) 500-200 MG-UNIT PER TABLET    Take 1 tablet by mouth daily.     GLUCOSAMINE 500 MG CAPS    Take 2 capsules by mouth daily.     IBUPROFEN (ADVIL,MOTRIN) 200 MG TABLET    as needed.     LORAZEPAM (ATIVAN) 1 MG TABLET    TAKE 1/2 TO 1 TABLET BY MOUTH 3 TIMES DAILY AS NEEDED FOR ANXIETY   MULTIPLE VITAMIN (MULTIVITAMIN) TABLET    Take 1 tablet by mouth daily.     SERTRALINE (ZOLOFT) 50 MG TABLET    Take 3 tablets by mouth every day   SOLIFENACIN (VESICARE) 5 MG TABLET    Take 1 tablet (5 mg  total) by mouth daily.  Modified Medications   Modified Medication Previous Medication   OMEPRAZOLE (PRILOSEC) 20 MG CAPSULE omeprazole (PRILOSEC) 20 MG capsule      Take 1 capsule (20 mg total) by mouth as needed.    Take 20 mg by mouth as needed.     SIMVASTATIN (ZOCOR) 20 MG TABLET simvastatin (ZOCOR) 20 MG tablet      Take 1 tablet (20 mg total) by mouth at bedtime.    Take 1 tablet (20 mg total) by mouth at bedtime.  Discontinued Medications   CIPROFLOXACIN (CIPRO) 250 MG TABLET    Take 1 tablet (250 mg total) by mouth 2 (two) times daily.   NEOMYCIN-POLYMYXIN-GRAMICIDIN (NEOSPORIN) OPHTHALMIC SOLUTION    2 drops to affected eye three times daily

## 2012-04-14 NOTE — Patient Instructions (Addendum)
Today we updated your med list in our EPIC system...    Continue your current medications the same...  We refilled your Simvastatin & Omeprazole...  We wrote a new prescription for DONEPIZIL (Aricept) 10mg -    Start w/ 1/2 tab daily for 3 wks then go to 1 tab daily thereafter...  You need to write down all your meds and you should have your daughters help w/ your meds...  We will arrange for a standard memory eval from DrWillis & the Neurologists...  Let's plan FASTING blood work w/ you back on the Simvastatin daily in about 3 months...    We should continue our 50mo check ups.Marland KitchenMarland Kitchen

## 2012-04-16 ENCOUNTER — Encounter: Payer: Self-pay | Admitting: Pulmonary Disease

## 2012-04-28 ENCOUNTER — Other Ambulatory Visit: Payer: Self-pay | Admitting: Pulmonary Disease

## 2012-05-12 ENCOUNTER — Other Ambulatory Visit: Payer: Self-pay | Admitting: Neurology

## 2012-05-12 DIAGNOSIS — R413 Other amnesia: Secondary | ICD-10-CM

## 2012-05-13 ENCOUNTER — Telehealth: Payer: Self-pay | Admitting: Internal Medicine

## 2012-05-13 NOTE — Telephone Encounter (Signed)
Pt complains of pacemaker beating? Not sure what means. Pt would like pacer checked. Offered appt for today (05-13-12) but pt has virus with vomiting and diarrhea. Pt would like appt on Monday (05-16-12). Scheduled for 05-16-12 @ 1500. Pt to cancel if still not feeling well.

## 2012-05-13 NOTE — Telephone Encounter (Signed)
Pt calling re pacemaker acting funny, feels if pulsing in her chest

## 2012-05-13 NOTE — Telephone Encounter (Signed)
Pt calling re problem with pacemaker, feels it pulsing

## 2012-05-20 DIAGNOSIS — I495 Sick sinus syndrome: Secondary | ICD-10-CM

## 2012-05-24 ENCOUNTER — Other Ambulatory Visit: Payer: Medicare Other

## 2012-05-24 ENCOUNTER — Ambulatory Visit
Admission: RE | Admit: 2012-05-24 | Discharge: 2012-05-24 | Disposition: A | Discharge status: Home / Self Care | Payer: Medicare Other | Source: Ambulatory Visit | Attending: Neurology | Admitting: Neurology

## 2012-05-24 DIAGNOSIS — R413 Other amnesia: Secondary | ICD-10-CM

## 2012-06-03 ENCOUNTER — Other Ambulatory Visit: Payer: Self-pay | Admitting: Pulmonary Disease

## 2012-06-07 ENCOUNTER — Telehealth: Payer: Self-pay | Admitting: Pulmonary Disease

## 2012-06-07 MED ORDER — LORAZEPAM 1 MG PO TABS: ORAL_TABLET | ORAL | Status: DC

## 2012-06-07 NOTE — Telephone Encounter (Signed)
I have called RX into the pharmacy. Pt is aware and needed nothing further 

## 2012-06-23 ENCOUNTER — Ambulatory Visit: Payer: Medicare Other | Admitting: Internal Medicine

## 2012-07-07 ENCOUNTER — Telehealth: Payer: Self-pay | Admitting: Internal Medicine

## 2012-07-07 NOTE — Telephone Encounter (Signed)
Left a message for patient again. 

## 2012-07-07 NOTE — Telephone Encounter (Signed)
Left a message for patient to call me. 

## 2012-07-08 NOTE — Telephone Encounter (Signed)
Left a message for patient to call me. 

## 2012-07-08 NOTE — Telephone Encounter (Signed)
Patient states she has bowel movement that just comes out. This is causing her skin to be sore. She states this has been going on for a long time. She also states she is having abdominal pain below her belly button in the middle of her stomach. This is new for her. No other symptoms. Scheduled patient on 07/11/12 at 10:00 AM with Doug Sou, PA.

## 2012-07-11 ENCOUNTER — Ambulatory Visit: Payer: Medicare Other | Admitting: Gastroenterology

## 2012-07-12 ENCOUNTER — Ambulatory Visit: Payer: Medicare Other | Admitting: Professional

## 2012-07-13 ENCOUNTER — Encounter: Payer: Self-pay | Admitting: Internal Medicine

## 2012-07-13 ENCOUNTER — Other Ambulatory Visit (INDEPENDENT_AMBULATORY_CARE_PROVIDER_SITE_OTHER): Payer: Medicare Other

## 2012-07-13 ENCOUNTER — Ambulatory Visit (INDEPENDENT_AMBULATORY_CARE_PROVIDER_SITE_OTHER): Payer: Medicare Other | Admitting: *Deleted

## 2012-07-13 ENCOUNTER — Encounter: Payer: Self-pay | Admitting: Gastroenterology

## 2012-07-13 ENCOUNTER — Ambulatory Visit (INDEPENDENT_AMBULATORY_CARE_PROVIDER_SITE_OTHER): Payer: Medicare Other | Admitting: Gastroenterology

## 2012-07-13 VITALS — BP 100/58 | HR 66 | Ht 63.0 in | Wt 157.0 lb

## 2012-07-13 DIAGNOSIS — F341 Dysthymic disorder: Secondary | ICD-10-CM

## 2012-07-13 DIAGNOSIS — K5730 Diverticulosis of large intestine without perforation or abscess without bleeding: Secondary | ICD-10-CM

## 2012-07-13 DIAGNOSIS — K6289 Other specified diseases of anus and rectum: Secondary | ICD-10-CM | POA: Insufficient documentation

## 2012-07-13 DIAGNOSIS — D649 Anemia, unspecified: Secondary | ICD-10-CM

## 2012-07-13 DIAGNOSIS — E7800 Pure hypercholesterolemia, unspecified: Secondary | ICD-10-CM

## 2012-07-13 DIAGNOSIS — I495 Sick sinus syndrome: Secondary | ICD-10-CM

## 2012-07-13 DIAGNOSIS — R159 Full incontinence of feces: Secondary | ICD-10-CM

## 2012-07-13 LAB — PACEMAKER DEVICE OBSERVATION
BATTERY VOLTAGE: 2.79 V
BRDY-0004RA: 110 {beats}/min
RV LEAD AMPLITUDE: 5.6 mv

## 2012-07-13 LAB — BASIC METABOLIC PANEL
BUN: 20 mg/dL (ref 6–23)
Chloride: 102 mEq/L (ref 96–112)
Creatinine, Ser: 1 mg/dL (ref 0.4–1.2)
GFR: 58.49 mL/min — ABNORMAL LOW (ref >60.00)
Glucose, Bld: 111 mg/dL — ABNORMAL HIGH (ref 70–99)
Potassium: 4.9 mEq/L (ref 3.5–5.1)

## 2012-07-13 LAB — HEPATIC FUNCTION PANEL
ALT: 15 U/L (ref 0–35)
AST: 24 U/L (ref 0–37)
Albumin: 3.7 g/dL (ref 3.5–5.2)

## 2012-07-13 LAB — CBC WITH DIFFERENTIAL/PLATELET
Eosinophils Relative: 3.7 % (ref 0.0–5.0)
Monocytes Relative: 5 % (ref 3.0–12.0)
Neutrophils Relative %: 60.1 % (ref 43.0–77.0)
Platelets: 208 10*3/uL (ref 150.0–400.0)
RBC: 4.55 Mil/uL (ref 3.87–5.11)
WBC: 6.3 10*3/uL (ref 4.5–10.5)

## 2012-07-13 LAB — TSH: TSH: 1.72 u[IU]/mL (ref 0.35–5.50)

## 2012-07-13 LAB — LIPID PANEL
Cholesterol: 188 mg/dL (ref 0–200)
Triglycerides: 123 mg/dL (ref 0.0–149.0)
VLDL: 24.6 mg/dL (ref 0.0–40.0)

## 2012-07-13 MED ORDER — HYDROCORTISONE ACE-PRAMOXINE 2.5-1 % RE CREA: TOPICAL_CREAM | Freq: Two times a day (BID) | RECTAL | Status: DC

## 2012-07-13 MED ORDER — CHOLESTYRAMINE 4 G PO PACK: 1.0000 | PACK | Freq: Every day | ORAL | Status: DC

## 2012-07-13 NOTE — Patient Instructions (Signed)
Please follow up with Dr. Juanda Chance in 2 months.  Prescriptions sent to your pharmacy.

## 2012-07-13 NOTE — Progress Notes (Signed)
Reviewed and agree.

## 2012-07-13 NOTE — Progress Notes (Signed)
07/13/2012 [name] [id number] Sep 19, 1928   History of Present Illness: Patient comes in today complaining of rectal leakage and anal soreness.  This has been an issue for quite some time, in fact, she was seen by Dr. Juanda Chance for this same issue in May 2012.  At that time she was given analpram cream prescription and told to use Calmoseptine cream as well.  She says that both of those seemed to help and she has continued to use the Calmoseptine but obviously has run out of the analpram.  She says that the leakage is becoming very troublesome.  Says that liquid stool just comes out almost on a daily basis even while she is at dinner, etc.  Current Medications, Allergies, Past Medical History, Past Surgical History, Family History and Social History were reviewed in [employer] record.   Physical Exam: Ht 5\' 3"  (1.6 m)  Wt 157 lb (71.215 kg)  BMI 27.82 kg/m2 General: Well developed, white female in no acute distress Head: Normocephalic and atraumatic Eyes:  sclerae anicteric, conjunctiva pink  Ears: Normal auditory acuity Lungs: Clear throughout to auscultation Heart: Regular rate and rhythm Abdomen: Soft, non-tender and non-distended. No masses, no hepatomegaly. Normal bowel sounds. Musculoskeletal: Symmetrical with no gross deformities  Extremities: No edema  Neurological: Alert oriented x 4, grossly nonfocal Psychological:  Alert and cooperative. Normal mood and affect  Assessment and Recommendations: Problem #1 Rectal leakage with anal soreness. Seen for this issue previously.  Thought to be most likely due to a rectocele causing incomplete evacuation. She also has decreased rectal sphincter tone, which has occurred with age and weakening of the pelvic muscles.  We will re-prescribe Analpram cream for her to use twice a day, and she will continue to use Calmoseptine lotion as well.

## 2012-07-13 NOTE — Progress Notes (Signed)
PPM check 

## 2012-07-25 ENCOUNTER — Telehealth: Payer: Self-pay | Admitting: Pulmonary Disease

## 2012-07-25 DIAGNOSIS — R739 Hyperglycemia, unspecified: Secondary | ICD-10-CM

## 2012-07-25 NOTE — Telephone Encounter (Signed)
Per SN---   Ok to refer to [facility] now.   Will need to follow a low carb diet.  thanks

## 2012-07-25 NOTE — Telephone Encounter (Signed)
Spoke with pt She is concerned about recent blood glucose of 111 Wants to be referred to a nutritionist now Please advise if you wish to refer the pt, thanks!

## 2012-07-25 NOTE — Telephone Encounter (Signed)
Spoke with pt and notified that referral okay per SN I have sent order to [facility] for this Nothing further needed per pt

## 2012-07-27 ENCOUNTER — Other Ambulatory Visit: Payer: Self-pay | Admitting: Pulmonary Disease

## 2012-08-16 ENCOUNTER — Ambulatory Visit: Payer: Medicare Other | Admitting: Internal Medicine

## 2012-08-19 ENCOUNTER — Ambulatory Visit (INDEPENDENT_AMBULATORY_CARE_PROVIDER_SITE_OTHER): Payer: Medicare Other | Admitting: Internal Medicine

## 2012-08-19 ENCOUNTER — Encounter: Payer: Self-pay | Admitting: Internal Medicine

## 2012-08-19 VITALS — BP 140/70 | HR 76 | Ht 63.0 in | Wt 162.0 lb

## 2012-08-19 DIAGNOSIS — K589 Irritable bowel syndrome without diarrhea: Secondary | ICD-10-CM

## 2012-08-19 DIAGNOSIS — I495 Sick sinus syndrome: Secondary | ICD-10-CM

## 2012-08-19 NOTE — Progress Notes (Signed)
[name] 16-Oct-1928 MRN [id number]   History of Present Illness:  This is an 79 year old white female with diarrhea predominant irritable bowel syndrome, rectal irritation and seepage. She was seen by Doug Sou, PA-C one month ago and was put on Questran 4gm/day, Calmoseptine paste and Analpram cream. She is much better today. Her last colonoscopy in April 2012 showed mild diverticulosis of the left colon. There is no recall due to the patient's age.   Past Medical History  Diagnosis Date  . Hearing loss   . Allergic rhinitis   . Sick sinus syndrome   . Presence of permanent cardiac pacemaker   . History of pericarditis   . Hypercholesteremia   . GERD (gastroesophageal reflux disease)   . IBS (irritable bowel syndrome)   . Diverticulosis of colon   . DJD (degenerative joint disease)   . Lumbar back pain   . Anxiety and depression   . Anemia   . Monoclonal gammopathy   . Anxiety   . Cataract   . Depression   . Osteoporosis     osteopenia  . Hiatal hernia   . Gastric polyps    Past Surgical History  Procedure Laterality Date  . Pacemaker placement      most recent generator replaced 2009 by Dr Juanda Chance  . Appendectomy    . Dilation and curettage of uterus  03/24/10    reports that she quit smoking about 25 years ago. Her smoking use included Cigarettes. She smoked 0.00 packs per day for 10 years. She has never used smokeless tobacco. She reports that she does not drink alcohol or use illicit drugs. family history includes Brain cancer in her brother; Clotting disorder in her sister; Colon cancer in her daughters; Crohn's disease in her sister; Diabetes in her sister; Heart attack in her father; Heart disease in her brother; Heart failure in her mother; Lung cancer in her sister; Pancreatic cancer in her brother and sister; Parkinsonism in her sister; and Rectal cancer in her daughter. No Known Allergies      Review of Systems: Negative for abdominal pain, rectal  bleeding,. Positive for weight gain  The remainder of the 10 point ROS is negative except as outlined in H&P   Physical Exam: General appearance  Well developed, in no distress. Eyes- non icteric. HEENT nontraumatic, normocephalic. Mouth no lesions, tongue papillated, no cheilosis. Neck supple without adenopathy, thyroid not enlarged, no carotid bruits, no JVD. Lungs Clear to auscultation bilaterally. Cor normal S1, normal S2, regular rhythm, no murmur,  quiet precordium. Abdomen: Soft nontender with normal active bowel sounds. No palpable mass. Rectal: Normal perianal area. No evidence of irritation or inflammation. Somewhat decreased rectal sphincter tone. Stool is so abundant and Hemoccult negative. Extremities no pedal edema. Skin no lesions. Neurological alert and oriented x 3. Psychological normal mood and affect.  Assessment and Plan:  Problem #90 79 year old white female who has irritable bowel syndrome with alternating diarrhea and constipation. She seemed to be constipated today. She has an incompetent rectal sphincter tone. She will discontinue Questran at this point and continue the rectal care. We will see her on an as necessary basis.   08/19/2012 Lina Sar

## 2012-08-19 NOTE — Patient Instructions (Addendum)
Follow up with Dr Brodie as needed. CC: Dr Scott Nadel 

## 2012-10-13 ENCOUNTER — Encounter: Payer: Self-pay | Admitting: Pulmonary Disease

## 2012-10-13 ENCOUNTER — Ambulatory Visit (INDEPENDENT_AMBULATORY_CARE_PROVIDER_SITE_OTHER): Payer: Medicare Other | Admitting: Pulmonary Disease

## 2012-10-13 VITALS — BP 120/70 | HR 63 | Temp 98.6°F | Ht 63.0 in | Wt 156.4 lb

## 2012-10-13 DIAGNOSIS — K589 Irritable bowel syndrome without diarrhea: Secondary | ICD-10-CM

## 2012-10-13 DIAGNOSIS — E7800 Pure hypercholesterolemia, unspecified: Secondary | ICD-10-CM

## 2012-10-13 DIAGNOSIS — K219 Gastro-esophageal reflux disease without esophagitis: Secondary | ICD-10-CM

## 2012-10-13 DIAGNOSIS — R413 Other amnesia: Secondary | ICD-10-CM

## 2012-10-13 DIAGNOSIS — M1990 Unspecified osteoarthritis, unspecified site: Secondary | ICD-10-CM

## 2012-10-13 DIAGNOSIS — R159 Full incontinence of feces: Secondary | ICD-10-CM

## 2012-10-13 DIAGNOSIS — Z950 Presence of cardiac pacemaker: Secondary | ICD-10-CM

## 2012-10-13 DIAGNOSIS — I495 Sick sinus syndrome: Secondary | ICD-10-CM

## 2012-10-13 DIAGNOSIS — K5730 Diverticulosis of large intestine without perforation or abscess without bleeding: Secondary | ICD-10-CM

## 2012-10-13 DIAGNOSIS — F341 Dysthymic disorder: Secondary | ICD-10-CM

## 2012-10-13 NOTE — Patient Instructions (Addendum)
Today we updated your med list in our EPIC system...    Continue your current medications the same...  Call for any questions...  Let's plan a follow up visit in 6mo, sooner if needed for problems...   

## 2012-10-16 ENCOUNTER — Encounter: Payer: Self-pay | Admitting: Pulmonary Disease

## 2012-10-16 NOTE — Progress Notes (Signed)
Subjective:    Patient ID: [name], female    DOB: [birth year], 79 y.o.   MRN: [id number]  HPI 79 y/o WF here for a follow up visit... she has multiple medical problems including:  SSS- s/p pacer insertion;  Hx pericarditis;  Hyperchol;  GERD/ Divertics/ IBS;  DJD/ LBP;  Anxiety/ Depression;  Anemia/ Monoclonal Gammopathy/ Borderline B12 level...  ~  January 31, 2010:  2d hx left ear pain- went to audiologist & sent here for ear infection... min drainage, no fever/ chills/ sweats, no adenopathy... exam shows some exud & inflammed EAC> we discussed Rx w/ Cortisporin Otic + Augmentin orally... she has bilat hearing aides & some further decr hearing noted... she will f/u w/ DrCrossley as needed.  ~  August 14, 2910:  6 month ROV & doing satis she says> still under alot of stress w/ sister's illness etc;  She has Ativan to use Prn but only taking it Qhs & we discussed this...    Hearing Loss>  She has new hearing aides...    SSS>  Followed by DrBB> now DrAllred w/ SSS, palpit, s/p pacer (last generator change 2000);  Last seen 10/11 & stable...    Hx pericarditis>  This occurred in 2001;  Echo in 2004 showed prob patent foramen w/ left to right interatrial shunt by doppler> DrBrodie is aware & following...    Chol>  She has been on Simva20 + FishOil w/ decent numbers, but she stopped on her own- wants to check FLP off meds; TChol 277, TG 85, HDL 66, LDL 185;  REC to restart Rx.    GI>  Uses OTC PPI as needed;  Last colonoscopy 2002 by DrGessner (neg x divertics) & she refuses repeat procedure despite daugh hx colon ca;  She tells me she has appt w/ DrDBrodie soon for incr IBS symptoms & will discuss colonoscopy vs stool cards at that time.    DJD>  She has mild-mod diffuse DJD & uses OTC meds prn...    MGUS>  She has monoclonal IgG kappa paraprot on SPE/IEP 12/10 w/ norm quant immunoglobulins;  B12 was low-normal range at 235;  Due for f/u labs> pending.  ~  April 14, 2011:  221mo ROV & she  presents w/ URI x 1d w/ nasal congestion, drainage- clear watery, cough w/o sput, denis f/c/s, denies SOB> we discussed ZPak & OTC meds for as needed use... she [place] 8/[place] & very happy there...  She saw DrAllred 10/12 for f/u SSS, pacer, palpit & doing well, no changes made;  She also saw DrDBrodie 5/12 w/ GERD/ Divertics/ IBS w/ EGD & Colon earlier this yr; c/o anal leakage most likely due to rectocele & decr sphincter tone- her note is reviewed...  She had recent fasting blood work> FLP reflects the fact that she is not taking the Simva20 daily & she is rec to do so;  Chems look good...  See prob list below>>  ~  October 13, 2011:  21mo ROV & [name] is stable> still somewhat depressed but on Zoloft & getting counseling; she stopped meds on her own & we discussed this, asked her to restart meds so we could assess their benefit going forward & she understands;  She continues to have pacer checks w/ DrAllred, GI follow ups w/ DrDBrodie...    We reviewed prob list, meds, xrays and labs> see below>> LABS 6/13:  CBC- wnl;  SPE- nonspecif pattern, no M-spike detected...   ~  April 14, 2012:  32mo ROV & [name] spoke to me in private about pt's worsening memory, losing things, repeating herself, medication errors, etc;  We discussed starting Aricept & refer to Neuro for full eval & their suggestions... We reviewed the following problems during today's visit>>    SSS, Pacer> on ASA81; followed by DrAllred & last seen 6/13- doing well clinically, pacer funct normally but not pacing (see his note, reviewed)...    Chol> she stopped Simva on her own, but agrees to restart Simva20; last FLP 12/12 (on diet alone) showed TChol 242, TG 111, HDL 65, LDL 144    GI- GERD, Divertics, IBS> on Omep20 but not taking daily; last EGD/ Colon 4/12- she notes occas reflux sx but denies abd pain, n/v, c/d, blood seen...    DJD, LBP> on OTC analgesics, calcium, MVI, VitD;  She denies specific  arthritic complaints...    Early Dementia, HOH> we decided to start Aricept 5=>10mg /d & refer to Neurology for full eval...    Anxiety, Depression> followed by [facility] on Zoloft50-3/d & Ativan1mg  prn; states she's doing OK...    Anemia & ?MGUS> we've been checking CBC & SPE/IEP every June; last Hg=12.6 Jun2013 & SPE was neg w/o Mspike... We reviewed prob list, meds, xrays and labs> see below for updates >> she had the Flu vaccine 10/13...  ADDENDUM 07/13/12>> Fasting labs showed FLP at goals on simva20, continue same;  Chems- wnl;  CBC- wnl;  TSH=1.72  ~  October 13, 2012:  32mo ROV & [name] indicates that she has been more depressed- she was seen by [facility]- Anne Fu ([facility], DrCottle) & started on a new med (didn't bring med bottles or med list to the office today);  She is also on Zoloft50-3tabs daily & Ativan1mg - 1/2 to 1 tab Tid prn;  She is followed by DrDBrodie for GI- seen 4/14- Divertics, IBS, rectocele, rectal irritation & seepage w/ incompetent recal sphincter (treated w/ Questran, Calmoseptine paste & Analpram cream);  She is also c/o urinary incont & wears pads, she was started on Vesicare5mg  12/13 but wonders if she needs this & wants to wean off;  Finally she has had f/u w/ Neuro- DrWillis for memory on Aricept10...     SSS, Pacer> on ASA81; followed by DrAllred & last seen 6/13- doing well clinically, pacer funct normally but not pacing (see his note, reviewed) & she remains asymptomatic w/o CP, palpit, dizzy, SOB, edema...    Chol> back on Simva20; last FLP 3/14 (on Simva20) showed TChol 188, TG 123, HDL 65, LDL 99- much improved, continue same...    GI- GERD, Divertics, IBS> on Omep20 but not taking daily; last EGD/ Colon 4/12- she notes occas reflux sx but denies abd pain, n/v, etc; followed by DrDBrodie w/ c/o IBS- alt diarrhea & constip, rectal irritation & seepage; treated w/ Questran, Calmoseptine paste & Analpram cream...     DJD, LBP> on OTC analgesics, calcium,  MVI, VitD;  She notes intermittent mild right hip pain- Rx w/ Advil, heat, etc...    Early Dementia, HOH> on Aricept10mg /d & seen by DrWillis 12/13- MMSE=28/30 & animal fluency=8 in 30sec; they did CT (?result not avail) & considered adding Namenda, offered Alz research...    Anxiety, Depression> followed by [facility] on Zoloft50-3/d & Ativan1mg  prn; recent incr depression symptoms & started on some new med by [employer]...    Anemia & ?MGUS> we've been checking CBC & SPE/IEP yearly; Hg=12.6 [id number] & SPE was neg w/o Mspike;  f/u CBC 3/14 showed Hg= 13.7 We reviewed prob list, meds, xrays and labs> see below for updates >>            Problem List:   HEARING LOSS (ICD-389.9) - she wears bilat hearing aides & has been tested by DrPahel...  ALLERGIC RHINITIS (ICD-477.9) - uses OTC antihistamines Prn...  SICK SINUS SYNDROME (ICD-427.81) - s/p pacemaker placed in 1996 for SSS... yearly pacer f/u by DrBrodie- generator changed 10/09 & seen 10/10 doing well... ~  She now follows up w/ DrAllred annually & doing well w/o CP, palpit, dizzy, SOB, edema, etc.... ~  6/13: she had f/u DrAllred> doing well, remains active, denies CP/palpit/SOB/syncope/etc; pacer function normally but atrial lead dysfunction, they are following...  Hx of PERICARDITIS (ICD-423.9) - hosp in 2001 w/ CP & acute pericarditis... serial Echo's showed resolution of the effusion... ~  2DEcho 5/04 showed a prob patent foramen ovale w/ left to right interatrial shunt by doppler... DrBBrodie is aware & following. ~  NuclearStressTest 5/04 was negative- no ischemia, no infarct, EF= 65%... ~  repeat Nuclear Stress Test 1/10 was neg- no scar or ischemia, EF=74%...  HYPERCHOLESTEROLEMIA (ICD-272.0) - on SIMVASTATIN 20mg /d & low fat diet... ~  FLP 7/08 showed TChol 196, TG 80, HDL 53, LDL 127... ~  FLP 6/09 off med showed TChol 237, TG 106, HDL 57, LDL 147... restart Simva20. ~  FLP 9/09 on Simva20 showed TChol 147, TG 73,  HDL 55, LDL 78 ~  FLP 10/10 on Simva20 showed TChol 172, TG 95, HDL 58, LDL 95 ~  FLP 4/12 off meds showed TChol 277, TG 85, HDL 66, LDL 185... rec to restart Simv20 daily... ~  FLP 12/12 on Simva20 showed TChol 242, TG 111, HDL 65, LDL 144... Admits not taking med regularly, advised to take it every day!!! ~  12/13:  She is not taking her statin; she agrees to restart Simva20 & will recheck FLP in 2-3 mo... ~  FLP 3/14 on Simva20 showed TChol 188, TG 123, HDL 65, LDL 99... Much improved, continue med...  GERD (ICD-530.81) - uses OTC PRILOSEC 20mg  Prn... ~  4/12:  EGD by DrDBrodie showed HH, esophagitis, mild gastritis, mult tiny gastric polyps, neg HPylori...  DIVERTICULOSIS OF COLON (ICD-562.10) - prev colonoscopy 12/02 by Rodena Medin w/ divertics only... there is a +fam hx of colon cancer in her youngest daughter... f/u colonoscopy is overdue (she refused). ~  4/12: she finally agreed to f/u colon, done by DrDBrodie> mild divertics, otherw neg, rec to incr fiber...  IRRITABLE BOWEL SYNDROME (ICD-564.1) - trial BENTYL 20mg  Prn for abd cramping... ~  last saw DrDBrodie for GI 2/10 w/ diarrhea, hx IBS, +FamHx ColonCa>> Rx Flagyl, Probiotic; pt refused colonoscopy. ~  [facility] 1/11 w/ severe gastroenteritis/ diarrhea/ mild dehydration- CDiff neg & Lactoferrin pos- treated w/ Cipro/ Flagyl/ Florastor & resolved. ~  2014> she had f/u w/ GI, DrDBrodie for Divertics, IBS, rectocele, rectal irritation & seepage w/ incompetent recal sphincter (treated w/ Questran, Calmoseptine paste & Analpram cream)...  DEGENERATIVE JOINT DISEASE (ICD-715.90) - she has mild-mod DJD (hands, etc) and Rx w/ Advil, Glucosamine...  Hx of BACK PAIN, LUMBAR (ICD-724.2) - eval by DrWillis in 2005 w/ LBP, some leg paresthesias and MRI showing DDD and mild sp stenosis... Rx'd w/ Aleve and Neurontin at that time...  MEMORY LOSS >> on ARICEPT 10mg /d... ~  She had Neuro eval DrWillis 12/13- MMSE=28/30 & animal fluency=8 in 30sec; they  did CT (?result not avail) & rec continue  Aricept10 & consider adding Namenda, offered Alz research...  ANXIETY DEPRESSION (ICD-300.4) - she is quite anxious and uses ATIVAN 1mg - 1/2 to 1 tab Tid Prn + ZOLOFT 50mg - 2 daily (she prev tried weaning off the Zoloft but felt worse off this med)... she notes that depression runs in her family. ~  12/13:  followed by [facility] on Zoloft50-3/d & Ativan1mg  prn; states she's doing OK... ~  6/14:   followed by [facility] on Zoloft50-3/d & Ativan1mg  prn; recent incr depression symptoms & started on some new med by [employer]...  ANEMIA (ICD-285.9) & ?MONOCLONAL GAMMOPATHY? - diagnosed 12/10 as below>> rec to start WOMENS MULTIVIT Daily & VIT B12 daily... 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 we plan f/u B12 & SPE/ IEP... ~  Labs 4/12 Hg= 13.2, B12= 420, SPE no Mspike detected... ~  Labs 6/13 showed Hg= 12.6, SPE- neg, no Mspike & nonspecific pattern... ~  Labs 3/14 showed Hg= 13.7   Past Surgical History  Procedure Laterality Date  . Pacemaker placement      most recent generator replaced 2009 by Dr Juanda Chance  . Appendectomy    . Dilation and curettage of uterus  03/24/10    Outpatient Encounter Prescriptions as of 10/13/2012  Medication Sig Dispense Refill  . aspirin 81 MG tablet Take 81 mg by mouth daily.        . Biotin 1000 MCG tablet Take 1,000 mcg by mouth daily.        . Calcium Carbonate-Vitamin D (CALCIUM-VITAMIN D) 500-200 MG-UNIT per tablet Take 1 tablet by mouth daily.        Marland Kitchen donepezil (ARICEPT) 10 MG tablet Take 10 mg by mouth at bedtime as needed.      . hydrocortisone-pramoxine (ANALPRAM-HC) 2.5-1 % rectal cream Place rectally 2 (two) times daily.  30 g  1  . ibuprofen (ADVIL,MOTRIN) 200 MG tablet as needed.        Marland Kitchen LORazepam (ATIVAN) 1 MG tablet 1/2 to 1 tablet by mouth 3 times  daily as needed for anxiety  90 tablet  4  . omeprazole (PRILOSEC) 20 MG capsule Take 1 capsule (20 mg total) by mouth as needed.  90 capsule  3  . sertraline (ZOLOFT) 50 MG tablet TAKE 2 TABLETS BY MOUTH ONCE DAILY  180 tablet  3  . simvastatin (ZOCOR) 20 MG tablet Take 1 tablet (20 mg total) by mouth at bedtime.  90 tablet  3  . VESICARE 5 MG tablet TAKE 1 TABLET EVERY DAY  30 tablet  11  . [DISCONTINUED] donepezil (ARICEPT) 10 MG tablet Take 1/2 tablet by mouth daily x 3 weeks then increase to 1 tablet daily  30 tablet  11  . [DISCONTINUED] cholestyramine (QUESTRAN) 4 G packet Take 1 packet by mouth daily.  30 each  3  . [DISCONTINUED] Multiple Vitamin (MULTIVITAMIN) tablet Take 1 tablet by mouth daily.         No facility-administered encounter medications on file as of 10/13/2012.    No Known Allergies   Current Medications, Allergies, Past Medical History, Past Surgical History, Family History, and Social History were reviewed in [employer] record.    Review of Systems        See HPI - all other systems are neg except as noted... The patient complains of decreased hearing.  The patient denies anorexia, fever,  weight loss, weight gain, vision loss, hoarseness, chest pain, syncope, dyspnea on exertion, peripheral edema, prolonged cough, headaches, hemoptysis, abdominal pain, melena, hematochezia, severe indigestion/heartburn, hematuria, incontinence, muscle weakness, suspicious skin lesions, transient blindness, difficulty walking, depression, unusual weight change, abnormal bleeding, enlarged lymph nodes, and angioedema.     Objective:   Physical Exam     WD, WN, 79 y/o WF in NAD... GENERAL:  Alert & oriented; pleasant & cooperative... HEENT:  [place]/AT, EOM-wnl, PERRLA, EACs- hearing aides bilat & left EAC exud/ sl red/ inflammed, NOSE-clear, THROAT-clear & wnl. NECK:  Supple w/ full ROM; no JVD; normal carotid impulses w/o bruits; no thyromegaly or nodules  palpated; no lymphadenopathy. CHEST:  Clear to P & A; without wheezes/ rales/ or rhonchi. HEART:  Regular Rhythm; without murmurs/ rubs/ or gallops. ABDOMEN:  Soft & nontender; normal bowel sounds; no organomegaly or masses detected. EXT: without deformities, mild arthritic changes; no varicose veins/ +venous insuffic/ no edema. NEURO:  CN's intact;  no focal neuro deficits... DERM:  No lesions noted; no rash etc...  RADIOLOGY DATA:  Reviewed in the EPIC EMR & discussed w/ the patient...  LABORATORY DATA:  Reviewed in the EPIC EMR & discussed w/ the patient...   Assessment & Plan:    Hearing Loss>  She has bilat hearing aides...  Cardiac> SSS, Pacer, Hx pericarditis>  Followed by DrAllred & denies arrhythmias, palpit, etc... She has a known pacer lead problem...  Hyperchol>  On Simva20 & repeat FLP is much improved> continue same...  HH/ GERD>  S/p EGD 4/12 by DrDBrodie; pt taking OTC Prilosec daily...  Divertics/ IBS/ leakage>  Colonoscopy 4/12 was essent neg, no polyps; leakage from rectocele & managed by GI- DrBrodie...  DJD/ LBP>  She has seen DrWillis in the past; taking Advil, Tylenol, OTC meds...  MEMORY LOSS>  On Aricept10 7 eval by drWillis as noted...  Anxiety/ Depression>  On Zoloft & Ativan... [facility] recently added new med...  ?MONOCLONAL GAMMOPATHY? - MGUS detected 12/10, but all studies have been neg since then...   Patient's Medications  New Prescriptions   No medications on file  Previous Medications   ASPIRIN 81 MG TABLET    Take 81 mg by mouth daily.     BIOTIN 1000 MCG TABLET    Take 1,000 mcg by mouth daily.     CALCIUM CARBONATE-VITAMIN D (CALCIUM-VITAMIN D) 500-200 MG-UNIT PER TABLET    Take 1 tablet by mouth daily.     HYDROCORTISONE-PRAMOXINE (ANALPRAM-HC) 2.5-1 % RECTAL CREAM    Place rectally 2 (two) times daily.   IBUPROFEN (ADVIL,MOTRIN) 200 MG TABLET    as needed.     LORAZEPAM (ATIVAN) 1 MG TABLET    1/2 to 1 tablet by mouth 3 times daily  as needed for anxiety   OMEPRAZOLE (PRILOSEC) 20 MG CAPSULE    Take 1 capsule (20 mg total) by mouth as needed.   SERTRALINE (ZOLOFT) 50 MG TABLET    TAKE 2 TABLETS BY MOUTH ONCE DAILY   SIMVASTATIN (ZOCOR) 20 MG TABLET    Take 1 tablet (20 mg total) by mouth at bedtime.   VESICARE 5 MG TABLET    TAKE 1 TABLET EVERY DAY  Modified Medications   Modified Medication Previous Medication   DONEPEZIL (ARICEPT) 10 MG TABLET donepezil (ARICEPT) 10 MG tablet      Take 10 mg by mouth at bedtime as needed.    Take 1/2 tablet by mouth daily x 3 weeks then increase to 1 tablet daily  Discontinued Medications   CHOLESTYRAMINE (QUESTRAN) 4 G PACKET    Take 1 packet by mouth daily.   MULTIPLE VITAMIN (MULTIVITAMIN) TABLET    Take 1 tablet by mouth daily.

## 2012-10-19 ENCOUNTER — Telehealth: Payer: Self-pay | Admitting: Internal Medicine

## 2012-10-19 NOTE — Telephone Encounter (Signed)
Pt states that she had diarrhea for 4 days. States that today she was at a funeral and had to leave because she felt so sick. States she came home and was very nauseated and had pain in the lower part of her abdomen all the way across. She had a normal bowel movement today. Pt wants to know what she can do about the abdominal pain she is having. States the nausea is not that bad. Please advise.

## 2012-10-19 NOTE — Telephone Encounter (Signed)
Levsin SL.125 mg, # 20 , 1 SL q 4 hrs for crampy abd pain and diarrhea

## 2012-10-21 NOTE — Telephone Encounter (Signed)
Spoke with patient and she states she does not remember having diarrhea this week. She states the abdominal pain is gone too. She has not had nausea since Wednesday. She just feels weak. Offered Levsin but patient states she does not have any symptoms other than weakness. She will call Dr. Kriste Basque her PCP.

## 2012-10-25 ENCOUNTER — Encounter: Payer: Self-pay | Admitting: Internal Medicine

## 2012-10-25 ENCOUNTER — Ambulatory Visit (INDEPENDENT_AMBULATORY_CARE_PROVIDER_SITE_OTHER): Payer: Medicare Other | Admitting: Internal Medicine

## 2012-10-25 VITALS — BP 122/78 | HR 72 | Ht 63.0 in | Wt 156.1 lb

## 2012-10-25 DIAGNOSIS — R197 Diarrhea, unspecified: Secondary | ICD-10-CM

## 2012-10-25 DIAGNOSIS — K589 Irritable bowel syndrome without diarrhea: Secondary | ICD-10-CM

## 2012-10-25 NOTE — Patient Instructions (Addendum)
Stop taking your Aricept.   cc: Alroy Dust, MD, Dr Lesia Sago

## 2012-10-25 NOTE — Progress Notes (Signed)
[name] [birth year] MRN [id number]        History of Present Illness:  This is a 79 year old white female with irritable bowel syndrome and memory loss , she has been on Aricept 10 mg daily for at least a year. She has been having diarrhea and urgency. Last colonoscopy in April 2012 showed mild diverticulosis of the left colon. She saw Lodema Pilot  in March of this year and started on Questran which helped. She denies rectal bleeding. She called 4 days ago complaining of diarrhea but when the nurse called her back patient denied having diarrhea. She told her daughter that nobody called her back. Patient comes today with her daughter Darl Pikes who confirms patient having diarrhea at least at times   Past Medical History  Diagnosis Date  . Hearing loss   . Allergic rhinitis   . Sick sinus syndrome   . Presence of permanent cardiac pacemaker   . History of pericarditis   . Hypercholesteremia   . GERD (gastroesophageal reflux disease)   . IBS (irritable bowel syndrome)   . Diverticulosis of colon   . DJD (degenerative joint disease)   . Lumbar back pain   . Anxiety and depression   . Anemia   . Monoclonal gammopathy   . Anxiety   . Cataract   . Depression   . Osteoporosis     osteopenia  . Hiatal hernia   . Gastric polyps    Past Surgical History  Procedure Laterality Date  . Pacemaker placement      most recent generator replaced 2009 by Dr Juanda Chance  . Appendectomy    . Dilation and curettage of uterus  03/24/10    reports that she quit smoking about 25 years ago. Her smoking use included Cigarettes. She has a 5 pack-year smoking history. She has never used smokeless tobacco. She reports that she does not drink alcohol or use illicit drugs. family history includes Brain cancer in her brother; Clotting disorder in her sister; Colon cancer in her daughters; Crohn's disease in her sister; Diabetes in her sister; Heart attack in her father; Heart disease in her brother;  Heart failure in her mother; Lung cancer in her sister; Pancreatic cancer in her brother and sister; Parkinsonism in her sister; and Rectal cancer in her daughter. No Known Allergies      Review of Systems: Occasional nausea but no vomiting. Mild weight gain  The remainder of the 10 point ROS is negative except as outlined in H&P   Physical Exam: General appearance  Well developed, in no distress. Eyes- non icteric. HEENT nontraumatic, normocephalic. Mouth no lesions, tongue papillated, no cheilosis. Neck supple without adenopathy, thyroid not enlarged, no carotid bruits, no JVD. Lungs Clear to auscultation bilaterally. Cor normal S1, normal S2, regular rhythm, no murmur,  quiet precordium. Abdomen: Soft nontender abdomen with normal active bowel sounds. No tenderness. No mass. Liver edge at costal margin Rectal: Not done Extremities no pedal edema. Skin no lesions. Neurological alert and oriented x 3. Psychological normal mood and affect.  Assessment and Plan:  79 year old white female with a history of irritable bowel syndrome who has had recent diarrheal stools. I have asked her to discontinue Aricept for 2 weeks to see with the diarrhea improves Up to 15% of patients on Aricept have diarrhea. If the diarrhea does not improve by stopping the Aricept we will consider flexible sigmoidoscopy to r/o microscopic colitis. Marland Kitchen She will call me back in 2 weeks   10/25/2012 [name]  Olevia Perches

## 2012-11-07 ENCOUNTER — Ambulatory Visit (INDEPENDENT_AMBULATORY_CARE_PROVIDER_SITE_OTHER): Payer: Medicare Other | Admitting: Nurse Practitioner

## 2012-11-07 ENCOUNTER — Encounter: Payer: Self-pay | Admitting: Nurse Practitioner

## 2012-11-07 VITALS — BP 141/77 | HR 66 | Ht 63.5 in | Wt 159.5 lb

## 2012-11-07 DIAGNOSIS — R413 Other amnesia: Secondary | ICD-10-CM

## 2012-11-07 DIAGNOSIS — I6381 Other cerebral infarction due to occlusion or stenosis of small artery: Secondary | ICD-10-CM

## 2012-11-07 DIAGNOSIS — I6350 Cerebral infarction due to unspecified occlusion or stenosis of unspecified cerebral artery: Secondary | ICD-10-CM

## 2012-11-07 NOTE — Patient Instructions (Addendum)
Continue ASA .81mg  F/U in 6 months

## 2012-11-07 NOTE — Progress Notes (Addendum)
HPI:  Patient returns for followup after initial evaluation by Dr. Anne Hahn 05/10/2012 she is an 79 year old right-handed female with a history of memory disturbance for approximately 18 months The patient has not noted much problems in terms of memory herself. The daughter however, has noted that she will have difficulty with her memory, and particularly has difficulty remembering conversations. The patient will repeat herself on occasion. The patient has issues with misplacing things about the house. The patient lives alone, and she does drive a car without problems. The patient is able to take notes and keep up with appointments. The patient has had some problems with keeping track of her medications, and the daughter recently got her a pill dispenser which has helped. The patient is handling her own finances, and she is paying her bills without problems. The patient has some slight imbalance issues, and she has had some urinary incontinence. The patient has not had any falls. The patient denies any focal numbness or weakness of the face, arms, or legs. The patient denies any headache. The patient has recently been placed on Aricept, but discontinued the drug several weeks ago due to excessive diarrhea The patient has had a several year history of depression and anxiety, but the depression is not particularly worse at this time. The patient has been treated with cholesterol medications for several years, but this predates the onset of her cognitive issues. CT of the head 05/24/2012 shows 2 mm right thalamic hypodensity likely a chronic lacunar infarction and mild subcortical atrophy. She returns for evaluation  ROS:  Hearing loss, joint pain, incontinence, memory loss, depression and anxiety  Physical Exam General: well developed, well nourished, seated, in no evident distress Head: head normocephalic and atraumatic. Oropharynx benign Neck: supple with no carotid  bruits Cardiovascular: regular rate and  rhythm, no murmurs  Neurologic Exam Mental Status: Awake and fully alert. MMSE 29/30 missing one recall item. AFT 8.  Follows all commands. Speech and language normal.   Cranial Nerves:  Pupils equal, briskly reactive to light. Extraocular movements full without nystagmus. Visual fields full to confrontation. Hearing intact and symmetric to finger snap. Facial sensation intact. Face, tongue, palate move normally and symmetrically. Neck flexion and extension normal.  Motor: Normal bulk and tone. Normal strength in all tested extremity muscles.No focal weakness Sensory.: intact to touch and pinprick and vibratory.  Coordination: Rapid alternating movements normal in all extremities. Finger-to-nose and heel-to-shin performed accurately bilaterally. Gait and Station: Arises from chair without difficulty. Stance is normal. Able to heel, toe and tandem walk without difficulty.  Reflexes: 2+ and symmetric. Toes downgoing.     ASSESSMENT: Mild memory disturbance,CT of the head 05/24/2012 shows 2 mm right thalamic hypodensity likely a chronic lacunar infarction and mild subcortical atrophy.      PLAN: Continue ASA .81mg  Patient does not want to be placed on another medication at this time Moderate exercise for overall health F/U in 6 months for MMSE  [name], GNP-BC APRN  I reviewed the above note and documentation and agree with the history, physical exam, assessment and plan as outlined above.

## 2012-11-18 DIAGNOSIS — I495 Sick sinus syndrome: Secondary | ICD-10-CM

## 2012-12-16 ENCOUNTER — Encounter: Payer: Self-pay | Admitting: Internal Medicine

## 2012-12-27 ENCOUNTER — Ambulatory Visit (INDEPENDENT_AMBULATORY_CARE_PROVIDER_SITE_OTHER): Payer: Medicare Other | Admitting: Pulmonary Disease

## 2012-12-27 ENCOUNTER — Encounter: Payer: Self-pay | Admitting: Pulmonary Disease

## 2012-12-27 VITALS — BP 130/70 | HR 65 | Temp 98.2°F | Ht 63.0 in | Wt 166.4 lb

## 2012-12-27 DIAGNOSIS — Z0130 Encounter for examination of blood pressure without abnormal findings: Secondary | ICD-10-CM

## 2012-12-27 DIAGNOSIS — Z950 Presence of cardiac pacemaker: Secondary | ICD-10-CM

## 2012-12-27 DIAGNOSIS — K589 Irritable bowel syndrome without diarrhea: Secondary | ICD-10-CM

## 2012-12-27 DIAGNOSIS — I495 Sick sinus syndrome: Secondary | ICD-10-CM

## 2012-12-27 DIAGNOSIS — Z136 Encounter for screening for cardiovascular disorders: Secondary | ICD-10-CM

## 2012-12-27 DIAGNOSIS — E7800 Pure hypercholesterolemia, unspecified: Secondary | ICD-10-CM

## 2012-12-27 DIAGNOSIS — R413 Other amnesia: Secondary | ICD-10-CM

## 2012-12-27 DIAGNOSIS — F341 Dysthymic disorder: Secondary | ICD-10-CM

## 2012-12-27 DIAGNOSIS — K5730 Diverticulosis of large intestine without perforation or abscess without bleeding: Secondary | ICD-10-CM

## 2012-12-27 DIAGNOSIS — M1990 Unspecified osteoarthritis, unspecified site: Secondary | ICD-10-CM

## 2012-12-27 DIAGNOSIS — I6350 Cerebral infarction due to unspecified occlusion or stenosis of unspecified cerebral artery: Secondary | ICD-10-CM

## 2012-12-27 DIAGNOSIS — K219 Gastro-esophageal reflux disease without esophagitis: Secondary | ICD-10-CM

## 2012-12-27 DIAGNOSIS — I6381 Other cerebral infarction due to occlusion or stenosis of small artery: Secondary | ICD-10-CM

## 2012-12-27 NOTE — Progress Notes (Signed)
Subjective:    Patient ID: [name], female    DOB: [birth year], 79 y.o.   MRN: [id number]  HPI 79 y/o WF here for a follow up visit... she has multiple medical problems including:  SSS- s/p pacer insertion;  Hx pericarditis;  Hyperchol;  GERD/ Divertics/ IBS;  DJD/ LBP;  Anxiety/ Depression;  Anemia/ Monoclonal Gammopathy/ Borderline B12 level...  ~  April 14, 2011:  21mo ROV & she presents w/ URI x 1d w/ nasal congestion, drainage- clear watery, cough w/o sput, denis f/c/s, denies SOB> we discussed ZPak & OTC meds for as needed use... she [place] 8/12 & very happy there...  She saw DrAllred 10/12 for f/u SSS, pacer, palpit & doing well, no changes made;  She also saw DrDBrodie 5/12 w/ GERD/ Divertics/ IBS w/ EGD & Colon earlier this yr; c/o anal leakage most likely due to rectocele & decr sphincter tone- her note is reviewed...  She had recent fasting blood work> FLP reflects the fact that she is not taking the Simva20 daily & she is rec to do so;  Chems look good...  See prob list below>>  ~  October 13, 2011:  28mo ROV & [name] is stable> still somewhat depressed but on Zoloft & getting counseling; she stopped meds on her own & we discussed this, asked her to restart meds so we could assess their benefit going forward & she understands;  She continues to have pacer checks w/ DrAllred, GI follow ups w/ DrDBrodie...    We reviewed prob list, meds, xrays and labs> see below>> LABS 6/13:  CBC- wnl;  SPE- nonspecif pattern, no M-spike detected...   ~  April 14, 2012:  28mo ROV & [name] spoke to me in private about pt's worsening memory, losing things, repeating herself, medication errors, etc;  We discussed starting Aricept & refer to Neuro for full eval & their suggestions... We reviewed the following problems during today's visit>>    SSS, Pacer> on ASA81; followed by DrAllred & last seen 6/13- doing well clinically, pacer funct normally but not pacing (see his  note, reviewed)...    Chol> she stopped Simva on her own, but agrees to restart Simva20; last FLP 12/12 (on diet alone) showed TChol 242, TG 111, HDL 65, LDL 144    GI- GERD, Divertics, IBS> on Omep20 but not taking daily; last EGD/ Colon 4/12- she notes occas reflux sx but denies abd pain, n/v, c/d, blood seen...    DJD, LBP> on OTC analgesics, calcium, MVI, VitD;  She denies specific arthritic complaints...    Early Dementia, HOH> we decided to start Aricept 5=>10mg /d & refer to Neurology for full eval...    Anxiety, Depression> followed by [facility] on Zoloft50-3/d & Ativan1mg  prn; states she's doing OK...    Anemia & ?MGUS> we've been checking CBC & SPE/IEP every June; last Hg=12.6 Jun2013 & SPE was neg w/o Mspike... We reviewed prob list, meds, xrays and labs> see below for updates >> she had the Flu vaccine 10/13...  ADDENDUM 07/13/12>> Fasting labs showed FLP at goals on simva20, continue same;  Chems- wnl;  CBC- wnl;  TSH=1.72  ~  October 13, 2012:  28mo ROV & [name] indicates that she has been more depressed- she was seen by [facility]- Anne Fu ([facility], DrCottle) & started on a new med (didn't bring med bottles or med list to the office today);  She is also on Zoloft50-3tabs daily & Ativan1mg - 1/2 to 1 tab  Tid prn;  She is followed by DrDBrodie for GI- seen 4/14- Divertics, IBS, rectocele, rectal irritation & seepage w/ incompetent recal sphincter (treated w/ Questran, Calmoseptine paste & Analpram cream);  She is also c/o urinary incont & wears pads, she was started on Vesicare5mg  12/13 but wonders if she needs this & wants to wean off;  Finally she has had f/u w/ Neuro- DrWillis for memory on Aricept10...     SSS, Pacer> on ASA81; followed by DrAllred & last seen 6/13- doing well clinically, pacer funct normally but not pacing (see his note, reviewed) & she remains asymptomatic w/o CP, palpit, dizzy, SOB, edema...    Chol> back on Simva20; last FLP 3/14 (on Simva20) showed TChol  188, TG 123, HDL 65, LDL 99- much improved, continue same...    GI- GERD, Divertics, IBS> on Omep20 but not taking daily; last EGD/ Colon 4/12- she notes occas reflux sx but denies abd pain, n/v, etc; followed by DrDBrodie w/ c/o IBS- alt diarrhea & constip, rectal irritation & seepage; treated w/ Questran, Calmoseptine paste & Analpram cream...     DJD, LBP> on OTC analgesics, calcium, MVI, VitD;  She notes intermittent mild right hip pain- Rx w/ Advil, heat, etc...    Early Dementia, HOH> on Aricept10mg /d & seen by DrWillis 12/13- MMSE=28/30 & animal fluency=8 in 30sec; they did CT (?result not avail) & considered adding Namenda, offered Alz research...    Anxiety, Depression> followed by [facility] on Zoloft50-3/d & Ativan1mg  prn; recent incr depression symptoms & started on some new med by [employer]...    Anemia & ?MGUS> we've been checking CBC & SPE/IEP yearly; Hg=12.6 Jun2013 & SPE was neg w/o Mspike; f/u CBC 3/14 showed Hg= 13.7 We reviewed prob list, meds, xrays and labs> see below for updates >>   ~  December 27, 2012:  2-21mo ROV & [name] returns today w/ report that her BP was elevated at several f/u visits w/ Anne Fu at [facility]- she thinks in the 160 range but did not bring a note & we don't have direct communication from [place]; her BP here today is 130/70 & review of epic show all similar BPs over the last 21mo- highest reading was 141/77 at Neuro office 6/14; her exam is otherw neg as well- clear lung, normal cardiac exam, pacer functioning normally at pacer checks, no bruits, no edema, etc... She does not appear to need BP meds at this time & I gave her some tips to control BP 7 avoid meds in the future by elim sodium from her diet & losing a few lbs...    She saw DrDBrodie 6/14 w/ c/o diarrhea- known hx of GERD, IBS, divertics, rectocele & seepage w/ incompetent rectal sphincter;  She thought that the Aricept may be causing the diarrhea & rec stopping this med for  several weeks to see if the prob resolved; pt reports that diarrhea resolved off the Aricept & she does not want to restart this med;  When she saw Darrol Angel 6/14 pt did not want to start a new med for her memory but in the interval she has seen the need for something to help her memory; I told her I would forward this note to DrWillis & let her Neurologists choose her replacement med...     We reviewed prob list, meds, xrays and labs> see below for updates >>            Problem List:   HEARING LOSS (ICD-389.9) -  she wears bilat hearing aides & has been tested by DrPahel...  ALLERGIC RHINITIS (ICD-477.9) - uses OTC antihistamines Prn...  SICK SINUS SYNDROME (ICD-427.81) - s/p pacemaker placed in 1996 for SSS... yearly pacer f/u by DrBrodie- generator changed 10/09 & seen 10/10 doing well... ~  She now follows up w/ DrAllred annually & doing well w/o CP, palpit, dizzy, SOB, edema, etc.... ~  6/13: she had f/u DrAllred> doing well, remains active, denies CP/palpit/SOB/syncope/etc; pacer function normally but atrial lead dysfunction, they are following...  Hx of PERICARDITIS (ICD-423.9) - hosp in 2001 w/ CP & acute pericarditis... serial Echo's showed resolution of the effusion... ~  2DEcho 5/04 showed a prob patent foramen ovale w/ left to right interatrial shunt by doppler... DrBBrodie is aware & following. ~  NuclearStressTest 5/04 was negative- no ischemia, no infarct, EF= 65%... ~  repeat Nuclear Stress Test 1/10 was neg- no scar or ischemia, EF=74%...  HYPERCHOLESTEROLEMIA (ICD-272.0) - on SIMVASTATIN 20mg /d & low fat diet... ~  FLP 7/08 showed TChol 196, TG 80, HDL 53, LDL 127... ~  FLP 6/09 off med showed TChol 237, TG 106, HDL 57, LDL 147... restart Simva20. ~  FLP 9/09 on Simva20 showed TChol 147, TG 73, HDL 55, LDL 78 ~  FLP 10/10 on Simva20 showed TChol 172, TG 95, HDL 58, LDL 95 ~  FLP 4/12 off meds showed TChol 277, TG 85, HDL 66, LDL 185... rec to restart Simv20 daily... ~   FLP 12/12 on Simva20 showed TChol 242, TG 111, HDL 65, LDL 144... Admits not taking med regularly, advised to take it every day!!! ~  12/13:  She is not taking her statin; she agrees to restart Simva20 & will recheck FLP in 2-3 mo... ~  FLP 3/14 on Simva20 showed TChol 188, TG 123, HDL 65, LDL 99... Much improved, continue med...  GERD (ICD-530.81) - uses OTC PRILOSEC 20mg  Prn... ~  4/12:  EGD by DrDBrodie showed HH, esophagitis, mild gastritis, mult tiny gastric polyps, neg HPylori...  DIVERTICULOSIS OF COLON (ICD-562.10) - prev colonoscopy 12/02 by Rodena Medin w/ divertics only... there is a +fam hx of colon cancer in her youngest daughter... f/u colonoscopy is overdue (she refused). ~  4/12: she finally agreed to f/u colon, done by DrDBrodie> mild divertics, otherw neg, rec to incr fiber...  IRRITABLE BOWEL SYNDROME (ICD-564.1) - trial BENTYL 20mg  Prn for abd cramping... ~  last saw DrDBrodie for GI 2/10 w/ diarrhea, hx IBS, +FamHx ColonCa>> Rx Flagyl, Probiotic; pt refused colonoscopy. ~  [facility] [place] 1/11 w/ severe gastroenteritis/ diarrhea/ mild dehydration- CDiff neg & Lactoferrin pos- treated w/ Cipro/ Flagyl/ Florastor & resolved. ~  2014> she had f/u w/ GI, DrDBrodie for Divertics, IBS, rectocele, rectal irritation & seepage w/ incompetent recal sphincter (treated w/ Questran, Calmoseptine paste & Analpram cream)... ~  6/14:  She saw DrDBrodie 6/14 w/ c/o diarrhea- known hx of GERD, IBS, divertics, rectocele & seepage w/ incompetent rectal sphincter;  She thought that the Aricept may be causing the diarrhea & rec stopping this med for several weeks to see if the prob resolved; pt reports that diarrhea resolved off the Aricept & she does not want to restart this med...  DEGENERATIVE JOINT DISEASE (ICD-715.90) - she has mild-mod DJD (hands, etc) and Rx w/ Advil, Glucosamine...  Hx of BACK PAIN, LUMBAR (ICD-724.2) - eval by DrWillis in 2005 w/ LBP, some leg paresthesias and MRI showing DDD and mild sp  stenosis... Rx'd w/ Aleve and Neurontin at that time...  MEMORY  LOSS >> prev on ARICEPT10 but it caused diarrhea & was stopped 6/14... ~  She had Neuro eval DrWillis 12/13- MMSE=28/30 & animal fluency=8 in 30sec; they did CT (?result not avail) & rec continue Aricept10 & consider adding Namenda, offered Alz research... ~  6/14:  She developed diarrhea on the Aricept which was held by DrDBrodie & the diarrhea resolved; she initially did not want a substitute med but now (8/14) sees the need for something to help her memory=> we will contact DrWillis.  ANXIETY DEPRESSION (ICD-300.4) - she is quite anxious and uses ATIVAN 1mg - 1/2 to 1 tab Tid Prn + ZOLOFT 50mg - 2 daily (she prev tried weaning off the Zoloft but felt worse off this med)... she notes that depression runs in her family. ~  12/13:  followed by [facility] on Zoloft50-3/d & Ativan1mg  prn; states she's doing OK... ~  6/14:   followed by [facility] on Zoloft50-3/d & Ativan1mg  prn; recent incr depression symptoms & started on some new med by [employer]...  ANEMIA (ICD-285.9) & ?MONOCLONAL GAMMOPATHY? - diagnosed 12/10 as below>> rec to start WOMENS MULTIVIT Daily & VIT B12 daily... 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 we plan f/u B12 & SPE/ IEP... ~  Labs 4/12 Hg= 13.2, B12= 420, SPE no Mspike detected... ~  Labs 6/13 showed Hg= 12.6, SPE- neg, no Mspike & nonspecific pattern... ~  Labs 3/14 showed Hg= 13.7   Past Surgical History  Procedure Laterality Date  . Pacemaker placement      most recent generator replaced 2009 by Dr Juanda Chance  . Appendectomy    . Dilation and curettage of uterus  03/24/10    Outpatient Encounter Prescriptions as of 12/27/2012  Medication Sig Dispense Refill  . aspirin 81 MG tablet Take 81 mg by mouth daily.        . Biotin 1000 MCG tablet Take 1,000 mcg  by mouth daily.        . Calcium Carbonate-Vitamin D (CALCIUM-VITAMIN D) 500-200 MG-UNIT per tablet Take 1 tablet by mouth daily.        . hydrocortisone-pramoxine (ANALPRAM-HC) 2.5-1 % rectal cream Place rectally 2 (two) times daily.  30 g  1  . ibuprofen (ADVIL,MOTRIN) 200 MG tablet as needed.        Marland Kitchen LORazepam (ATIVAN) 1 MG tablet 1 mg daily. 1/2 to 1 tablet by mouth 3 times daily as needed for anxiety      . omeprazole (PRILOSEC) 20 MG capsule Take 1 capsule (20 mg total) by mouth as needed.  90 capsule  3  . sertraline (ZOLOFT) 50 MG tablet TAKE 2 TABLETS BY MOUTH ONCE DAILY  180 tablet  3  . simvastatin (ZOCOR) 20 MG tablet Take 1 tablet (20 mg total) by mouth at bedtime.  90 tablet  3  . VESICARE 5 MG tablet TAKE 1 TABLET EVERY DAY  30 tablet  11   No facility-administered encounter medications on file as of 12/27/2012.    Not on File   Current Medications, Allergies, Past Medical History, Past Surgical History, Family History, and Social History were reviewed in [employer] record.    Review of Systems        See HPI - all other systems are neg except as noted... The patient complains of decreased hearing.  The patient denies anorexia, fever, weight loss, weight gain, vision  loss, hoarseness, chest pain, syncope, dyspnea on exertion, peripheral edema, prolonged cough, headaches, hemoptysis, abdominal pain, melena, hematochezia, severe indigestion/heartburn, hematuria, incontinence, muscle weakness, suspicious skin lesions, transient blindness, difficulty walking, depression, unusual weight change, abnormal bleeding, enlarged lymph nodes, and angioedema.     Objective:   Physical Exam     WD, WN, 79 y/o WF in NAD... GENERAL:  Alert & oriented; pleasant & cooperative... HEENT:  [place]/AT, EOM-wnl, PERRLA, EACs- hearing aides bilat & left EAC exud/ sl red/ inflammed, NOSE-clear, THROAT-clear & wnl. NECK:  Supple w/ full ROM; no JVD; normal carotid impulses w/o  bruits; no thyromegaly or nodules palpated; no lymphadenopathy. CHEST:  Clear to P & A; without wheezes/ rales/ or rhonchi. HEART:  Regular Rhythm; without murmurs/ rubs/ or gallops. ABDOMEN:  Soft & nontender; normal bowel sounds; no organomegaly or masses detected. EXT: without deformities, mild arthritic changes; no varicose veins/ +venous insuffic/ no edema. NEURO:  CN's intact;  no focal neuro deficits... DERM:  No lesions noted; no rash etc...  RADIOLOGY DATA:  Reviewed in the EPIC EMR & discussed w/ the patient...  LABORATORY DATA:  Reviewed in the EPIC EMR & discussed w/ the patient...   Assessment & Plan:    BP check>  BPs were up at [facility] counseling (?160 sys) but checks normal here & in Epic records- I don't believe she needs BP meds at this time & rec low sodium & wt reduction...  Memory>  She is off the Aricept due to diarrhea which resolved off this med, 7 is now ready to start something else for her memory=> we will check w/ DrWillis...   Hearing Loss>  She has bilat hearing aides...  Cardiac> SSS, Pacer, Hx pericarditis>  Followed by DrAllred & denies arrhythmias, palpit, etc... She has a known pacer lead problem...  Hyperchol>  On Simva20 & repeat FLP is much improved> continue same...  HH/ GERD>  S/p EGD 4/12 by DrDBrodie; pt taking OTC Prilosec daily...  Divertics/ IBS/ leakage>  Colonoscopy 4/12 was essent neg, no polyps; leakage from rectocele & managed by GI- DrBrodie...  DJD/ LBP>  She has seen DrWillis in the past; taking Advil, Tylenol, OTC meds...  MEMORY LOSS>  Prev on Aricept per DrWillis but this was stopped 6/14 due to diarrhea that resolved off this med...  Anxiety/ Depression>  On Zoloft & Ativan... [facility] recently added new med...  ?MONOCLONAL GAMMOPATHY? - MGUS detected 12/10, but all studies have been neg since then...   Patient's Medications  New Prescriptions   No medications on file  Previous Medications   ASPIRIN 81 MG TABLET     Take 81 mg by mouth daily.     BIOTIN 1000 MCG TABLET    Take 1,000 mcg by mouth daily.     CALCIUM CARBONATE-VITAMIN D (CALCIUM-VITAMIN D) 500-200 MG-UNIT PER TABLET    Take 1 tablet by mouth daily.     HYDROCORTISONE-PRAMOXINE (ANALPRAM-HC) 2.5-1 % RECTAL CREAM    Place rectally 2 (two) times daily.   IBUPROFEN (ADVIL,MOTRIN) 200 MG TABLET    as needed.     LORAZEPAM (ATIVAN) 1 MG TABLET    1 mg daily. 1/2 to 1 tablet by mouth 3 times daily as needed for anxiety   OMEPRAZOLE (PRILOSEC) 20 MG CAPSULE    Take 1 capsule (20 mg total) by mouth as needed.   SERTRALINE (ZOLOFT) 50 MG TABLET    TAKE 2 TABLETS BY MOUTH ONCE DAILY   SIMVASTATIN (ZOCOR) 20 MG TABLET  Take 1 tablet (20 mg total) by mouth at bedtime.   VESICARE 5 MG TABLET    TAKE 1 TABLET EVERY DAY  Modified Medications   No medications on file  Discontinued Medications   No medications on file

## 2012-12-27 NOTE — Patient Instructions (Addendum)
Today we updated your med list in our EPIC system...    Continue your current medications the same...  Since your diarrhea resolved off the Aricept, I do not recommend restarting that medication...    There are several options here, since you want to get back on something for your memory- and I will contact DrWillis for his suggestion...  For your BP>    Your blood pressure has been good here on several measurements over the last 6months & reads 130/70 today, you do not need medication...    But uou can help your BP w/o meds by avoiding salt/ sodium and getting your weight down a few lbs...  Call for any questions...  Let's plan a follow up visit in 31mo, sooner if needed for problems.Marland KitchenMarland Kitchen

## 2012-12-28 ENCOUNTER — Telehealth: Payer: Self-pay | Admitting: Neurology

## 2012-12-28 MED ORDER — RIVASTIGMINE 4.6 MG/24HR TD PT24: 1.0000 | MEDICATED_PATCH | Freq: Every day | TRANSDERMAL | Status: DC

## 2012-12-28 NOTE — Telephone Encounter (Signed)
I called the patient. The patient apparently has been having diarrhea on Aricept. The medication was stopped, and we will try the Exelon patch instead. If the diarrhea recurs, the patient will need to come off of this medication, and she is to contact our office. We will try Namenda instead if this is the case. I discussed this with the patient.

## 2013-02-14 ENCOUNTER — Encounter: Payer: Self-pay | Admitting: *Deleted

## 2013-02-27 ENCOUNTER — Telehealth: Payer: Self-pay | Admitting: Neurology

## 2013-02-27 MED ORDER — MEMANTINE HCL 10 MG PO TABS: 10.0000 mg | ORAL_TABLET | Freq: Two times a day (BID) | ORAL | Status: DC

## 2013-02-27 NOTE — Telephone Encounter (Signed)
I called patient. The patient could not tolerate Aricept or Exelon the patient will be given samples for Namenda, and I'll write a prescription for this.

## 2013-03-09 ENCOUNTER — Other Ambulatory Visit: Payer: Self-pay

## 2013-03-09 DIAGNOSIS — Z1231 Encounter for screening mammogram for malignant neoplasm of breast: Secondary | ICD-10-CM

## 2013-03-27 ENCOUNTER — Other Ambulatory Visit: Payer: Self-pay | Admitting: Pulmonary Disease

## 2013-04-05 ENCOUNTER — Ambulatory Visit (INDEPENDENT_AMBULATORY_CARE_PROVIDER_SITE_OTHER): Payer: Medicare Other | Admitting: *Deleted

## 2013-04-05 ENCOUNTER — Encounter: Payer: Self-pay | Admitting: Internal Medicine

## 2013-04-05 DIAGNOSIS — Z950 Presence of cardiac pacemaker: Secondary | ICD-10-CM

## 2013-04-05 DIAGNOSIS — I495 Sick sinus syndrome: Secondary | ICD-10-CM

## 2013-04-05 LAB — MDC_IDC_ENUM_SESS_TYPE_INCLINIC
Battery Impedance: 1000 Ohm — CL
Brady Statistic RV Percent Paced: 1 % — CL
Date Time Interrogation Session: 20141126141759
Implantable Pulse Generator Serial Number: 1234823
Lead Channel Impedance Value: 508 Ohm
Lead Channel Pacing Threshold Amplitude: 1.25 V
Lead Channel Sensing Intrinsic Amplitude: 7.8 mV
Lead Channel Setting Pacing Pulse Width: 0.5 ms
Lead Channel Setting Sensing Sensitivity: 2 mV

## 2013-04-05 NOTE — Progress Notes (Signed)
Device check in clinic, all functions normal, no changes made, full details in [facility].  ROV w/ Dr. Johney Frame in 69mo.

## 2013-04-11 ENCOUNTER — Ambulatory Visit: Payer: Medicare Other

## 2013-04-17 ENCOUNTER — Ambulatory Visit (INDEPENDENT_AMBULATORY_CARE_PROVIDER_SITE_OTHER): Payer: Medicare Other | Admitting: Pulmonary Disease

## 2013-04-17 ENCOUNTER — Encounter: Payer: Self-pay | Admitting: Pulmonary Disease

## 2013-04-17 VITALS — BP 126/78 | HR 63 | Temp 97.7°F | Ht 63.0 in | Wt 157.0 lb

## 2013-04-17 DIAGNOSIS — F341 Dysthymic disorder: Secondary | ICD-10-CM

## 2013-04-17 DIAGNOSIS — K589 Irritable bowel syndrome without diarrhea: Secondary | ICD-10-CM

## 2013-04-17 DIAGNOSIS — R413 Other amnesia: Secondary | ICD-10-CM

## 2013-04-17 DIAGNOSIS — I495 Sick sinus syndrome: Secondary | ICD-10-CM

## 2013-04-17 DIAGNOSIS — K219 Gastro-esophageal reflux disease without esophagitis: Secondary | ICD-10-CM

## 2013-04-17 DIAGNOSIS — Z950 Presence of cardiac pacemaker: Secondary | ICD-10-CM

## 2013-04-17 DIAGNOSIS — H9190 Unspecified hearing loss, unspecified ear: Secondary | ICD-10-CM

## 2013-04-17 DIAGNOSIS — M1990 Unspecified osteoarthritis, unspecified site: Secondary | ICD-10-CM

## 2013-04-17 DIAGNOSIS — E7800 Pure hypercholesterolemia, unspecified: Secondary | ICD-10-CM

## 2013-04-17 DIAGNOSIS — M545 Low back pain: Secondary | ICD-10-CM

## 2013-04-17 DIAGNOSIS — K5730 Diverticulosis of large intestine without perforation or abscess without bleeding: Secondary | ICD-10-CM

## 2013-04-17 DIAGNOSIS — I6381 Other cerebral infarction due to occlusion or stenosis of small artery: Secondary | ICD-10-CM

## 2013-04-17 DIAGNOSIS — D649 Anemia, unspecified: Secondary | ICD-10-CM

## 2013-04-17 NOTE — Progress Notes (Signed)
Subjective:    Patient ID: [name], female    DOB: 1928/07/15, 79 y.o.   MRN: [id number]  HPI 79 y/o WF here for a follow up visit... she has multiple medical problems including:  SSS- s/p pacer insertion;  Hx pericarditis;  Hyperchol;  GERD/ Divertics/ IBS;  DJD/ LBP;  Anxiety/ Depression;  Anemia/ Monoclonal Gammopathy/ Borderline B12 level...  ~  October 13, 2011:  47mo ROV & [name] is stable> still somewhat depressed but on Zoloft & getting counseling; she stopped meds on her own & we discussed this, asked her to restart meds so we could assess their benefit going forward & she understands;  She continues to have pacer checks w/ [name], GI follow ups w/ [name]...    We reviewed prob list, meds, xrays and labs> see below>> LABS 6/13:  CBC- wnl;  SPE- nonspecif pattern, no M-spike detected...   ~  April 14, 2012:  47mo ROV & [name] spoke to me in private about pt's worsening memory, losing things, repeating herself, medication errors, etc;  We discussed starting Aricept & refer to Neuro for full eval & their suggestions... We reviewed the following problems during today's visit>>    SSS, Pacer> on ASA81; followed by [name] & last seen 6/13- doing well clinically, pacer funct normally but not pacing (see his note, reviewed)...    Chol> she stopped Simva on her own, but agrees to restart Simva20; last FLP 12/12 (on diet alone) showed TChol 242, TG 111, HDL 65, LDL 144    GI- GERD, Divertics, IBS> on Omep20 but not taking daily; last EGD/ Colon 4/12- she notes occas reflux sx but denies abd pain, n/v, c/d, blood seen...    DJD, LBP> on OTC analgesics, calcium, MVI, VitD;  She denies specific arthritic complaints...    Early Dementia, HOH> we decided to start Aricept 5=>10mg /d & refer to Neurology for full eval...    Anxiety, Depression> followed by [name] on Zoloft50-3/d & Ativan1mg  prn; states she's doing OK...    Anemia & ?MGUS> we've been checking CBC & SPE/IEP  every June; last Hg=12.6 Jun2013 & SPE was neg w/o Mspike... We reviewed prob list, meds, xrays and labs> see below for updates >> she had the Flu vaccine 10/13...  ADDENDUM 07/13/12>> Fasting labs showed FLP at goals on simva20, continue same;  Chems- wnl;  CBC- wnl;  TSH=1.72  ~  October 13, 2012:  47mo ROV & [name] indicates that she has been more depressed- she was seen by Psyche- [name] (Tamara, [name]) & started on a new med (didn't bring med bottles or med list to the office today);  She is also on Zoloft50-3tabs daily & Ativan1mg - 1/2 to 1 tab Tid prn;  She is followed by [name] for GI- seen 4/14- Divertics, IBS, rectocele, rectal irritation & seepage w/ incompetent recal sphincter (treated w/ Questran, Calmoseptine paste & Analpram cream);  She is also c/o urinary incont & wears pads, she was started on Vesicare5mg  12/13 but wonders if she needs this & wants to wean off;  Finally she has had f/u w/ Neuro- [name] for memory on Aricept10...     SSS, Pacer> on ASA81; followed by [name] & last seen 6/13- doing well clinically, pacer funct normally but not pacing (see his note, reviewed) & she remains asymptomatic w/o CP, palpit, dizzy, SOB, edema...    Chol> back on Simva20; last FLP 3/14 (on Simva20) showed TChol 188, TG 123, HDL 65, LDL 99- much improved, continue same.Marland KitchenMarland Kitchen  GI- GERD, Divertics, IBS> on Omep20 but not taking daily; last EGD/ Colon 4/12- she notes occas reflux sx but denies abd pain, n/v, etc; followed by [name] w/ c/o IBS- alt diarrhea & constip, rectal irritation & seepage; treated w/ Questran, Calmoseptine paste & Analpram cream...     DJD, LBP> on OTC analgesics, calcium, MVI, VitD;  She notes intermittent mild right hip pain- Rx w/ Advil, heat, etc...    Early Dementia, HOH> on Aricept10mg /d & seen by [name] 12/13- MMSE=28/30 & animal fluency=8 in 30sec; they did CT (?result not avail) & considered adding Namenda, offered Alz research...    Anxiety, Depression>  followed by [name] on Zoloft50-3/d & Ativan1mg  prn; recent incr depression symptoms & started on some new med by [name]...    Anemia & ?MGUS> we've been checking CBC & SPE/IEP yearly; Hg=12.6 Jun2013 & SPE was neg w/o Mspike; f/u CBC 3/14 showed Hg= 13.7 We reviewed prob list, meds, xrays and labs> see below for updates >>   ~  December 27, 2012:  2-540mo ROV & [name] returns today w/ report that her BP was elevated at several f/u visits w/ [name] at [facility]- she thinks in the 160 range but did not bring a note & we don't have direct communication from [place]; her BP here today is 130/70 & review of epic show all similar BPs over the last 52mo- highest reading was 141/77 at Neuro office 6/14; her exam is otherw neg as well- clear lung, normal cardiac exam, pacer functioning normally at pacer checks, no bruits, no edema, etc... She does not appear to need BP meds at this time & I gave her some tips to control BP & avoid meds in the future by elim sodium from her diet & losing a few lbs...    She saw [name] 6/14 w/ c/o diarrhea- known hx of GERD, IBS, divertics, rectocele & seepage w/ incompetent rectal sphincter;  She thought that the Aricept may be causing the diarrhea & rec stopping this med for several weeks to see if the prob resolved; pt reports that diarrhea resolved off the Aricept & she does not want to restart this med;  When she saw [name] 6/14 pt did not want to start a new med for her memory but in the interval she has seen the need for something to help her memory; I told her I would forward this note to [name] & let her Neurologists choose her replacement med...     We reviewed prob list, meds, xrays and labs> see below for updates >>   ~  April 17, 2013:  40mo ROV & [name] is stable- lives at [facility], walks daily, nice people, etc; she notes her grandson & his wife are preg & this will be her 1st great grand;  Her BP has been good- no reported  episodes of elev BP etc just on diet control (and she has lost 7# down tp 157# today... We reviewed the following medical problems during today's office visit >>     SSS, Pacer> on ASA81; BP= 126/78; followed by [name] & last seen 6/13- doing well clinically, pacer funct normally but not pacing (see his note, reviewed) & she remains asymptomatic w/o CP, palpit, dizzy, SOB, edema...    Chol> on Simva20; FLP 3/14 showed TChol 188, TG 123, HDL 65, LDL 99- much improved, continue same...    GI- GERD, Divertics, IBS> on Omep20 but not taking daily; last EGD/ Colon 4/12- she  notes occas reflux sx but denies abd pain, n/v, etc; followed by [name] w/ c/o IBS- alt diarrhea & constip, rectal irritation & seepage; treated w/ Questran, Calmoseptine paste & Analpram cream...     DJD, LBP> on OTC analgesics, calcium, MVI, VitD;  She notes intermittent mild right hip pain- Rx w/ Advil, heat, etc...    Early Dementia, HOH> on Aricept10mg /d & seen by [name] 12/13- MMSE=28/30 & animal fluency=8 in 30sec; they did CT (?result not avail); she thinks the Aricept caused diarrhea so she stopped it- Neuro added Namenda10Bid...    Anxiety, Depression> followed by [name] on Zoloft50-2/d & Ativan1mg  prn; SHE DID NOT BRING MED LIST OR BOTTLES TO THE OFFICE FOR REVIEW.    Anemia & ?MGUS> we've been checking CBC & SPE/IEP yearly; Hg=12.6 Jun2013 & SPE was neg w/o Mspike; f/u CBC 3/14 showed Hg= 13.7 We reviewed prob list, meds, xrays and labs> see below for updates >> she had the 2014 Flu vaccine in Oct...           Problem List:   HEARING LOSS (ICD-389.9) - she wears bilat hearing aides & has been tested by DrPahel...  ALLERGIC RHINITIS (ICD-477.9) - uses OTC antihistamines Prn...  SICK SINUS SYNDROME (ICD-427.81) - s/p pacemaker placed in 1996 for SSS... yearly pacer f/u by DrBrodie- generator changed 10/09 & seen 10/10 doing well... ~  She now follows up w/ [name] annually & doing well w/o CP,  palpit, dizzy, SOB, edema, etc.... ~  6/13: she had f/u [name]> doing well, remains active, denies CP/palpit/SOB/syncope/etc; pacer function normally but atrial lead dysfunction, they are following...  Hx of PERICARDITIS (ICD-423.9) - hosp in 2001 w/ CP & acute pericarditis... serial Echo's showed resolution of the effusion... ~  2DEcho 5/04 showed a prob patent foramen ovale w/ left to right interatrial shunt by doppler... DrBBrodie is aware & following. ~  NuclearStressTest 5/04 was negative- no ischemia, no infarct, EF= 65%... ~  repeat Nuclear Stress Test 1/10 was neg- no scar or ischemia, EF=74%...  HYPERCHOLESTEROLEMIA (ICD-272.0) - on SIMVASTATIN 20mg /d & low fat diet... ~  FLP 7/08 showed TChol 196, TG 80, HDL 53, LDL 127... ~  FLP 6/09 off med showed TChol 237, TG 106, HDL 57, LDL 147... restart Simva20. ~  FLP 9/09 on Simva20 showed TChol 147, TG 73, HDL 55, LDL 78 ~  FLP 10/10 on Simva20 showed TChol 172, TG 95, HDL 58, LDL 95 ~  FLP 4/12 off meds showed TChol 277, TG 85, HDL 66, LDL 185... rec to restart Simv20 daily... ~  FLP 12/12 on Simva20 showed TChol 242, TG 111, HDL 65, LDL 144... Admits not taking med regularly, advised to take it every day!!! ~  12/13:  She is not taking her statin; she agrees to restart Simva20 & will recheck FLP in 2-3 mo... ~  FLP 3/14 on Simva20 showed TChol 188, TG 123, HDL 65, LDL 99... Much improved, continue med...  GERD (ICD-530.81) - uses OTC PRILOSEC 20mg  Prn... ~  4/12:  EGD by [name] showed HH, esophagitis, mild gastritis, mult tiny gastric polyps, neg HPylori...  DIVERTICULOSIS OF COLON (ICD-562.10) - prev colonoscopy 12/02 by Rodena Medin w/ divertics only... there is a +fam hx of colon cancer in her youngest daughter... f/u colonoscopy is overdue (she refused). ~  4/12: she finally agreed to f/u colon, done by [name]> mild divertics, otherw neg, rec to incr fiber...  IRRITABLE BOWEL SYNDROME (ICD-564.1) - trial BENTYL 20mg  Prn for abd  cramping... ~  last saw [name] for GI 2/10 w/ diarrhea, hx IBS, +FamHx ColonCa>> Rx Flagyl, Probiotic; pt refused colonoscopy. ~  [facility] 1/11 w/ severe gastroenteritis/ diarrhea/ mild dehydration- CDiff neg & Lactoferrin pos- treated w/ Cipro/ Flagyl/ Florastor & resolved. ~  2014> she had f/u w/ GI, [name] for Divertics, IBS, rectocele, rectal irritation & seepage w/ incompetent recal sphincter (treated w/ Questran, Calmoseptine paste & Analpram cream)... ~  6/14:  She saw [name] 6/14 w/ c/o diarrhea- known hx of GERD, IBS, divertics, rectocele & seepage w/ incompetent rectal sphincter;  She thought that the Aricept may be causing the diarrhea & rec stopping this med for several weeks to see if the prob resolved; pt reports that diarrhea resolved off the Aricept & she does not want to restart this med... ~  She thought the Aricept was poss causing the diarrhea so shje stopped this on her own...  DEGENERATIVE JOINT DISEASE (ICD-715.90) - she has mild-mod DJD (hands, etc) and Rx w/ Advil, Glucosamine...  Hx of BACK PAIN, LUMBAR (ICD-724.2) - eval by [name] in 2005 w/ LBP, some leg paresthesias and MRI showing DDD and mild sp stenosis... Rx'd w/ Aleve and Neurontin at that time...  MEMORY LOSS >> prev on ARICEPT10 but she thought it caused diarrhea & stopped 6/14... ~  She had Neuro eval [name] 12/13- MMSE=28/30 & animal fluency=8 in 30sec; they did CT (?result not avail) & rec continue Aricept10 & consider adding Namenda, offered Alz research... ~  6/14:  She developed diarrhea on the Aricept which was held by [name] & the diarrhea resolved; she was started on Namenda10Bid in it's place...  ANXIETY DEPRESSION (ICD-300.4) - she is quite anxious and uses ATIVAN 1mg - 1/2 to 1 tab Tid Prn + ZOLOFT 50mg - 2 daily (she prev tried weaning off the Zoloft but felt worse off this med)... she notes that depression runs in her family. ~  12/13:  followed by [name] on Zoloft50-3/d &  Ativan1mg  prn; states she's doing OK... ~  6/14:   followed by [name] on Zoloft50-3/d & Ativan1mg  prn; recent incr depression symptoms & started on some new med by [name]... ~  12/14:  SHE DID NOT BRING MED BOTTLES OR LIST TO THE OFFICE TODAY- Zoloft50-2/d & Ativan1mg  prn are listed...  ANEMIA (ICD-285.9) & ?MONOCLONAL GAMMOPATHY? - diagnosed 12/10 as below>> rec to start WOMENS MULTIVIT Daily & VIT B12 daily... 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 we plan f/u B12 & SPE/ IEP... ~  Labs 4/12 Hg= 13.2, B12= 420, SPE no Mspike detected... ~  Labs 6/13 showed Hg= 12.6, SPE- neg, no Mspike & nonspecific pattern... ~  Labs 3/14 showed Hg= 13.7   Past Surgical History  Procedure Laterality Date  . Pacemaker placement      most recent generator replaced 2009 by Dr Juanda Chance  . Appendectomy    . Dilation and curettage of uterus  03/24/10    Outpatient Encounter Prescriptions as of 04/17/2013  Medication Sig  . aspirin 81 MG tablet Take 81 mg by mouth daily.    . Calcium Carbonate-Vitamin D (CALCIUM-VITAMIN D) 500-200 MG-UNIT per tablet Take 1 tablet by mouth daily.    . hydrocortisone-pramoxine (ANALPRAM-HC) 2.5-1 % rectal cream Place rectally 2 (two) times daily.  Marland Kitchen ibuprofen (ADVIL,MOTRIN) 200 MG tablet as needed.    Marland Kitchen LORazepam (ATIVAN) 1 MG tablet 1 mg daily. 1/2 to  1 tablet by mouth 3 times daily as needed for anxiety  . memantine (NAMENDA) 10 MG tablet Take 1 tablet (10 mg total) by mouth 2 (two) times daily.  Marland Kitchen omeprazole (PRILOSEC) 20 MG capsule Take 1 capsule (20 mg total) by mouth as needed.  . sertraline (ZOLOFT) 50 MG tablet TAKE 2 TABLETS BY MOUTH ONCE DAILY  . simvastatin (ZOCOR) 20 MG tablet TAKE 1 TABLET BY MOUTH AT BEDTIME  . VESICARE 5 MG tablet TAKE 1 TABLET EVERY DAY  . Biotin 1000 MCG tablet Take 1,000 mcg by mouth daily.       Allergies  Allergen Reactions  . Aricept [Donepezil Hcl]     Diarrhea  . Exelon [Rivastigmine Tartrate]     Diarrhea    Current Medications, Allergies, Past Medical History, Past Surgical History, Family History, and Social History were reviewed in [employer] record.    Review of Systems        See HPI - all other systems are neg except as noted... The patient complains of decreased hearing.  The patient denies anorexia, fever, weight loss, weight gain, vision loss, hoarseness, chest pain, syncope, dyspnea on exertion, peripheral edema, prolonged cough, headaches, hemoptysis, abdominal pain, melena, hematochezia, severe indigestion/heartburn, hematuria, incontinence, muscle weakness, suspicious skin lesions, transient blindness, difficulty walking, depression, unusual weight change, abnormal bleeding, enlarged lymph nodes, and angioedema.     Objective:   Physical Exam     WD, WN, 79 y/o WF in NAD... GENERAL:  Alert & oriented; pleasant & cooperative... HEENT:  [place]/AT, EOM-wnl, PERRLA, EACs- hearing aides bilat & left EAC exud/ sl red/ inflammed, NOSE-clear, THROAT-clear & wnl. NECK:  Supple w/ full ROM; no JVD; normal carotid impulses w/o bruits; no thyromegaly or nodules palpated; no lymphadenopathy. CHEST:  Clear to P & A; without wheezes/ rales/ or rhonchi. HEART:  Regular Rhythm; without murmurs/ rubs/ or gallops. ABDOMEN:  Soft & nontender; normal bowel sounds; no organomegaly or masses detected. EXT: without deformities, mild arthritic changes; no varicose veins/ +venous insuffic/ no edema. NEURO:  CN's intact;  no focal neuro deficits... DERM:  No lesions noted; no rash etc...  RADIOLOGY DATA:  Reviewed in the EPIC EMR & discussed w/ the patient...  LABORATORY DATA:  Reviewed in the EPIC EMR & discussed w/ the patient...   Assessment & Plan:    Hearing Loss>  She has bilat hearing aides...  Cardiac> SSS, Pacer, Hx pericarditis>  BP is ok;  followed by [name] & denies arrhythmias, palpit, etc... She has a known pacer lead problem...  Hyperchol>  On Simva20 & repeat FLP is much improved> continue same...  HH/ GERD>  S/p EGD 4/12 by [name]; pt taking OTC Prilosec daily...  Divertics/ IBS/ leakage>  Colonoscopy 4/12 was essent neg, no polyps; leakage from rectocele & managed by GI- DrBrodie...  DJD/ LBP>  She has seen [name] in the past for the LBP; taking Advil, Tylenol, OTC meds...  MEMORY LOSS>  Prev on Aricept per [name] but this was stopped 6/14 due to diarrhea & they switched to Namenda...  Anxiety/ Depression>  On Zoloft & Ativan... Tamara recently added new med...  ?MONOCLONAL GAMMOPATHY? - MGUS detected 12/10, but all studies have been neg since then...   Patient's Medications  New Prescriptions   No medications on file  Previous Medications   BIOTIN 1000 MCG TABLET    Take 5,000 mcg by mouth daily.    BUSPIRONE (BUSPAR) 5 MG TABLET       IBUPROFEN (  ADVIL,MOTRIN) 200 MG TABLET    as needed.     LORAZEPAM (ATIVAN) 1 MG TABLET    1 mg daily.    MEMANTINE (NAMENDA) 10 MG TABLET    Take 1 tablet (10 mg total) by mouth 2 (two) times daily.   OMEPRAZOLE (PRILOSEC) 20 MG CAPSULE    Take 1 capsule (20 mg total) by mouth as needed.   SERTRALINE (ZOLOFT) 50 MG TABLET    TAKE 2 TABLETS BY MOUTH ONCE DAILY   SIMVASTATIN (ZOCOR) 20 MG TABLET    TAKE 1 TABLET BY MOUTH AT BEDTIME   VESICARE 5 MG TABLET    TAKE 1 TABLET EVERY DAY  Modified Medications   Modified Medication Previous Medication   HYDROCORTISONE-PRAMOXINE (ANALPRAM-HC) 2.5-1 % RECTAL CREAM hydrocortisone-pramoxine (ANALPRAM-HC) 2.5-1 % rectal cream      Place rectally as needed.    Place rectally 2 (two) times daily.  Discontinued Medications   ASPIRIN 81 MG TABLET    Take 81 mg by mouth daily.     CALCIUM CARBONATE-VITAMIN D (CALCIUM-VITAMIN D) 500-200 MG-UNIT PER TABLET    Take 1 tablet by mouth daily.

## 2013-04-17 NOTE — Patient Instructions (Signed)
Today we updated your med list in our EPIC system...    Continue your current medications the same...  Call for any questions...  Let's plan a follow up visit in 87mo w/ fasting labs at that time.Marland KitchenMarland Kitchen

## 2013-05-15 ENCOUNTER — Encounter: Payer: Self-pay | Admitting: Nurse Practitioner

## 2013-05-15 ENCOUNTER — Ambulatory Visit (INDEPENDENT_AMBULATORY_CARE_PROVIDER_SITE_OTHER): Payer: Medicare Other | Admitting: Nurse Practitioner

## 2013-05-15 VITALS — BP 133/66 | HR 78 | Ht 62.5 in | Wt 151.0 lb

## 2013-05-15 DIAGNOSIS — I6350 Cerebral infarction due to unspecified occlusion or stenosis of unspecified cerebral artery: Secondary | ICD-10-CM

## 2013-05-15 DIAGNOSIS — R413 Other amnesia: Secondary | ICD-10-CM

## 2013-05-15 DIAGNOSIS — I6381 Other cerebral infarction due to occlusion or stenosis of small artery: Secondary | ICD-10-CM

## 2013-05-15 NOTE — Progress Notes (Signed)
I have read the note, and I agree with the clinical assessment and plan.  WILLIS,CHARLES KEITH   

## 2013-05-15 NOTE — Patient Instructions (Signed)
Will continue namenda 10 mg twice daily Memory score is stable F/U in 6 months

## 2013-05-15 NOTE — Progress Notes (Signed)
[facility]  PATIENT: [name] DOB: 1928-06-02   REASON FOR VISIT: Followup for memory loss   HISTORY OF PRESENT ILLNESS: [name], 80 year old female returns for followup with her daughter. She is now living at [facility]. She has had side effects to Exelon and Aricept in the past and is now on Namenda 10 twice daily tolerating the medication without difficulty. She has a 3 year history of depression and anxiety which has not worsened over time. She tells me she goes to a chair aerobics class several times a week. She writes notes to herself to keep up with appointments. She has not had any focal numbness or weakness, she has not fallen. She and her daughter feel her memory is stable.  HISTORY:80 year old right-handed female with a history of memory disturbance for approximately 18 months The patient has not noted much problems in terms of memory herself. The daughter however, has noted that she will have difficulty with her memory, and particularly has difficulty remembering conversations. The patient will repeat herself on occasion. The patient has issues with misplacing things about the house. The patient lives alone, and she does drive a car without problems. The patient is able to take notes and keep up with appointments. The patient has had some problems with keeping track of her medications, and the daughter recently got her a pill dispenser which has helped. The patient is handling her own finances, and she is paying her bills without problems. The patient has some slight imbalance issues, and she has had some urinary incontinence. The patient has not had any falls. The patient denies any focal numbness or weakness of the face, arms, or legs. The patient denies any headache. The patient has recently been placed on Aricept, but discontinued the drug several weeks ago due to excessive diarrhea The patient has had a several year history of  depression and anxiety, but the depression is not particularly worse at this time. The patient has been treated with cholesterol medications for several years, but this predates the onset of her cognitive issues. CT of the head 05/24/2012 shows 2 mm right thalamic hypodensity likely a chronic lacunar infarction and mild subcortical atrophy. She returns for evaluation      REVIEW OF SYSTEMS: Full 14 system review of systems performed and notable only for those listed, all others are neg:  Constitutional: N/A  Cardiovascular: N/A  Ear/Nose/Throat: Hearing loss Skin: N/A  Eyes: N/A  Respiratory: N/A  Gastroitestinal: N/A  Hematology/Lymphatic: N/A  Endocrine: N/A Musculoskeletal: Joint pain Allergy/Immunology: N/A  Neurological: Memory loss Psychiatric: Depression anxiety  ALLERGIES: Allergies  Allergen Reactions  . Aricept [Donepezil Hcl]     Diarrhea  . Exelon [Rivastigmine Tartrate]     Diarrhea    HOME MEDICATIONS: Outpatient Prescriptions Prior to Visit  Medication Sig Dispense Refill  . Biotin 1000 MCG tablet Take 5,000 mcg by mouth daily.       Marland Kitchen ibuprofen (ADVIL,MOTRIN) 200 MG tablet as needed.        Marland Kitchen LORazepam (ATIVAN) 1 MG tablet 1 mg daily.       . memantine (NAMENDA) 10 MG tablet Take 1 tablet (10 mg total) by mouth 2 (two) times daily.  60 tablet  5  . omeprazole (PRILOSEC) 20 MG capsule Take 1 capsule (20 mg total) by mouth as needed.  90 capsule  3  . sertraline (ZOLOFT) 50 MG tablet TAKE 2 TABLETS BY MOUTH ONCE DAILY  180 tablet  3  .  simvastatin (ZOCOR) 20 MG tablet TAKE 1 TABLET BY MOUTH AT BEDTIME  90 tablet  3  . VESICARE 5 MG tablet TAKE 1 TABLET EVERY DAY  30 tablet  11  . hydrocortisone-pramoxine (ANALPRAM-HC) 2.5-1 % rectal cream Place rectally 2 (two) times daily.  30 g  1  . aspirin 81 MG tablet Take 81 mg by mouth daily.        . Calcium Carbonate-Vitamin D (CALCIUM-VITAMIN D) 500-200 MG-UNIT per tablet Take 1 tablet by mouth daily.         No  facility-administered medications prior to visit.    PAST MEDICAL HISTORY: Past Medical History  Diagnosis Date  . Hearing loss   . Allergic rhinitis   . Sick sinus syndrome   . Presence of permanent cardiac pacemaker   . History of pericarditis   . Hypercholesteremia   . GERD (gastroesophageal reflux disease)   . IBS (irritable bowel syndrome)   . Diverticulosis of colon   . DJD (degenerative joint disease)   . Lumbar back pain   . Anxiety and depression   . Anemia   . Monoclonal gammopathy   . Anxiety   . Cataract   . Depression   . Osteoporosis     osteopenia  . Hiatal hernia   . Gastric polyps   . Memory loss     PAST SURGICAL HISTORY: Past Surgical History  Procedure Laterality Date  . Pacemaker placement      most recent generator replaced 2009 by Dr Olevia Perches  . Appendectomy    . Dilation and curettage of uterus  03/24/10    FAMILY HISTORY: Family History  Problem Relation Age of Onset  . Heart failure Mother   . Heart attack Father   . Parkinsonism Sister   . Rectal cancer Daughter   . Colon cancer Daughter   . Pancreatic cancer Brother   . Pancreatic cancer Sister   . Brain cancer Brother   . Heart disease Brother   . Colon cancer Daughter   . Diabetes Sister   . Clotting disorder Sister   . Lung cancer Sister     twin  . Crohn's disease Sister     twin    SOCIAL HISTORY: History   Social History  . Marital Status: Widowed    Spouse Name: N/A    Number of Children: 3  . Years of Education: 12   Occupational History  .      retired   Social History Main Topics  . Smoking status: Former Smoker -- 0.50 packs/day for 10 years    Types: Cigarettes    Quit date: 05/12/1987  . Smokeless tobacco: Never Used  . Alcohol Use: 0.6 oz/week    1 Glasses of wine per week     Comment: wine occasionally  . Drug Use: No  . Sexual Activity: Not on file   Other Topics Concern  . Not on file   Social History Narrative   Pt has 12 siblings.    Alcohol use- no   Drug use- no   Daily caffeine use.      Patient is a widow.   Retired   [employer] school education.              PHYSICAL EXAM  Filed Vitals:   05/15/13 1116  BP: 133/66  Pulse: 78  Height: 5' 2.5" (1.588 m)  Weight: 151 lb (68.493 kg)   Body mass index is 27.16 kg/(m^2).  Generalized: Well developed, in  no acute distress  Head: normocephalic and atraumatic,. Oropharynx benign   Neurological examination   Mentation: Alert oriented to time, place, history taking. MMSE 29/30 missing 1item in recall. AFT =8. Follows all commands speech and language fluent  Cranial nerve II-XII: Pupils were equal round reactive to light extraocular movements were full, visual field were full on confrontational test. Facial sensation and strength were normal. hearing was intact to finger rubbing bilaterally. Uvula tongue midline. head turning and shoulder shrug were normal and symmetric.Tongue protrusion into cheek strength was normal. Motor: normal bulk and tone, full strength in the BUE, BLE, fine finger movements normal, no pronator drift. No focal weakness Coordination: finger-nose-finger, heel-to-shin bilaterally, no dysmetria Reflexes: Brachioradialis 2/2, biceps 2/2, triceps 2/2, patellar 2/2, Achilles 2/2, plantar responses were flexor bilaterally. Gait and Station: Rising up from seated position without assistance, normal stance,  moderate stride, good arm swing, smooth turning, able to perform tiptoe, and heel walking without difficulty. Tandem gait is steady  DIAGNOSTIC DATA (LABS, IMAGING, TESTING) - I reviewed patient records, labs, notes, testing and imaging myself where available.  Lab Results  Component Value Date   WBC 6.3 07/13/2012   HGB 13.7 07/13/2012   HCT 40.5 07/13/2012   MCV 89.0 07/13/2012   PLT 208.0 07/13/2012      Component Value Date/Time   NA 140 07/13/2012 0921   K 4.9 07/13/2012 0921   CL 102 07/13/2012 0921   CO2 31 07/13/2012 0921   GLUCOSE 111* 07/13/2012  0921   BUN 20 07/13/2012 0921   CREATININE 1.0 07/13/2012 0921   CALCIUM 9.2 07/13/2012 0921   PROT 7.2 07/13/2012 0921   ALBUMIN 3.7 07/13/2012 0921   AST 24 07/13/2012 0921   ALT 15 07/13/2012 0921   ALKPHOS 85 07/13/2012 0921   BILITOT 0.7 07/13/2012 0921   GFRNONAA >60 06/13/2009 0431   GFRAA  Value: >60        The eGFR has been calculated using the MDRD equation. This calculation has not been validated in all clinical situations. eGFR's persistently <60 mL/min signify possible Chronic Kidney Disease. 06/13/2009 0431   Lab Results  Component Value Date   CHOL 188 07/13/2012   HDL 64.60 07/13/2012   LDLCALC 99 07/13/2012   LDLDIRECT 143.7 04/14/2011   TRIG 123.0 07/13/2012   CHOLHDL 3 07/13/2012     Lab Results  Component Value Date   TSH 1.72 07/13/2012      ASSESSMENT AND PLAN  80 y.o. year old female  has a past medical history of memory disturbance, CT of the head 1/14/ 2014 shows 2 mm right thalamic density likely  chronic lacunar infarct and mild subcortical atrophy.She is currently on Namenda 10 mg twice daily without side effects. She has failed Aricept and Exelon in the past.  Will continue namenda 10 mg twice daily, does not need refills Memory score is stable F/U in 6 months Dennie Bible, [facility], [facility], APRN  [facility] [address] [place], [place] [address] [phone]

## 2013-05-19 ENCOUNTER — Encounter: Payer: Self-pay | Admitting: Internal Medicine

## 2013-05-19 DIAGNOSIS — I495 Sick sinus syndrome: Secondary | ICD-10-CM

## 2013-08-17 ENCOUNTER — Other Ambulatory Visit: Payer: Self-pay | Admitting: Pulmonary Disease

## 2013-08-17 DIAGNOSIS — D649 Anemia, unspecified: Secondary | ICD-10-CM

## 2013-08-17 DIAGNOSIS — E7800 Pure hypercholesterolemia, unspecified: Secondary | ICD-10-CM

## 2013-08-17 DIAGNOSIS — F341 Dysthymic disorder: Secondary | ICD-10-CM

## 2013-09-18 ENCOUNTER — Ambulatory Visit: Payer: Medicare Other | Admitting: Pulmonary Disease

## 2013-10-13 ENCOUNTER — Encounter (INDEPENDENT_AMBULATORY_CARE_PROVIDER_SITE_OTHER): Payer: Self-pay

## 2013-10-13 ENCOUNTER — Ambulatory Visit
Admission: RE | Admit: 2013-10-13 | Discharge: 2013-10-13 | Disposition: A | Discharge status: Home / Self Care | Payer: Medicare Other | Source: Ambulatory Visit

## 2013-10-13 DIAGNOSIS — Z1231 Encounter for screening mammogram for malignant neoplasm of breast: Secondary | ICD-10-CM

## 2013-10-17 ENCOUNTER — Ambulatory Visit: Payer: Medicare Other | Admitting: Pulmonary Disease

## 2013-10-23 ENCOUNTER — Ambulatory Visit (INDEPENDENT_AMBULATORY_CARE_PROVIDER_SITE_OTHER): Payer: Medicare Other | Admitting: Pulmonary Disease

## 2013-10-23 ENCOUNTER — Encounter: Payer: Self-pay | Admitting: Pulmonary Disease

## 2013-10-23 ENCOUNTER — Other Ambulatory Visit (INDEPENDENT_AMBULATORY_CARE_PROVIDER_SITE_OTHER): Payer: Medicare Other

## 2013-10-23 VITALS — BP 126/72 | HR 69 | Temp 97.7°F | Ht 63.0 in | Wt 150.6 lb

## 2013-10-23 DIAGNOSIS — D649 Anemia, unspecified: Secondary | ICD-10-CM

## 2013-10-23 DIAGNOSIS — M545 Low back pain, unspecified: Secondary | ICD-10-CM

## 2013-10-23 DIAGNOSIS — E7800 Pure hypercholesterolemia, unspecified: Secondary | ICD-10-CM

## 2013-10-23 DIAGNOSIS — R413 Other amnesia: Secondary | ICD-10-CM

## 2013-10-23 DIAGNOSIS — K219 Gastro-esophageal reflux disease without esophagitis: Secondary | ICD-10-CM

## 2013-10-23 DIAGNOSIS — Z950 Presence of cardiac pacemaker: Secondary | ICD-10-CM

## 2013-10-23 DIAGNOSIS — D472 Monoclonal gammopathy: Secondary | ICD-10-CM

## 2013-10-23 DIAGNOSIS — I6350 Cerebral infarction due to unspecified occlusion or stenosis of unspecified cerebral artery: Secondary | ICD-10-CM

## 2013-10-23 DIAGNOSIS — I495 Sick sinus syndrome: Secondary | ICD-10-CM

## 2013-10-23 DIAGNOSIS — M1990 Unspecified osteoarthritis, unspecified site: Secondary | ICD-10-CM

## 2013-10-23 DIAGNOSIS — F341 Dysthymic disorder: Secondary | ICD-10-CM

## 2013-10-23 DIAGNOSIS — I6381 Other cerebral infarction due to occlusion or stenosis of small artery: Secondary | ICD-10-CM

## 2013-10-23 DIAGNOSIS — K5730 Diverticulosis of large intestine without perforation or abscess without bleeding: Secondary | ICD-10-CM

## 2013-10-23 DIAGNOSIS — K589 Irritable bowel syndrome without diarrhea: Secondary | ICD-10-CM

## 2013-10-23 LAB — BASIC METABOLIC PANEL
BUN: 16 mg/dL (ref 6–23)
CO2: 28 meq/L (ref 19–32)
Calcium: 9.2 mg/dL (ref 8.4–10.5)
Chloride: 105 mEq/L (ref 96–112)
Creatinine, Ser: 0.9 mg/dL (ref 0.4–1.2)
GFR: 66.11 mL/min (ref >60.00)
Glucose, Bld: 117 mg/dL — ABNORMAL HIGH (ref 70–99)
Potassium: 4.2 mEq/L (ref 3.5–5.1)
Sodium: 140 mEq/L (ref 135–145)

## 2013-10-23 LAB — LIPID PANEL
Cholesterol: 295 mg/dL — ABNORMAL HIGH (ref 0–200)
HDL: 69 mg/dL (ref >39.00)
LDL Cholesterol: 204 mg/dL — ABNORMAL HIGH (ref 0–99)
NonHDL: 226
Total CHOL/HDL Ratio: 4
Triglycerides: 109 mg/dL (ref 0.0–149.0)
VLDL: 21.8 mg/dL (ref 0.0–40.0)

## 2013-10-23 LAB — CBC WITH DIFFERENTIAL/PLATELET
Basophils Absolute: 0 10*3/uL (ref 0.0–0.1)
Basophils Relative: 0.3 % (ref 0.0–3.0)
EOS PCT: 2.9 % (ref 0.0–5.0)
Eosinophils Absolute: 0.2 10*3/uL (ref 0.0–0.7)
HEMATOCRIT: 41 % (ref 36.0–46.0)
Hemoglobin: 13.6 g/dL (ref 12.0–15.0)
Lymphocytes Relative: 29.2 % (ref 12.0–46.0)
Lymphs Abs: 1.9 10*3/uL (ref 0.7–4.0)
MCHC: 33.2 g/dL (ref 30.0–36.0)
MCV: 90.6 fl (ref 78.0–100.0)
Monocytes Absolute: 0.3 10*3/uL (ref 0.1–1.0)
Monocytes Relative: 5 % (ref 3.0–12.0)
Neutro Abs: 4.1 10*3/uL (ref 1.4–7.7)
Neutrophils Relative %: 62.6 % (ref 43.0–77.0)
Platelets: 213 10*3/uL (ref 150.0–400.0)
RBC: 4.52 Mil/uL (ref 3.87–5.11)
RDW: 14.1 % (ref 11.5–15.5)
WBC: 6.6 10*3/uL (ref 4.0–10.5)

## 2013-10-23 LAB — TSH: TSH: 0.68 u[IU]/mL (ref 0.35–4.50)

## 2013-10-23 LAB — HEPATIC FUNCTION PANEL
ALK PHOS: 71 U/L (ref 39–117)
ALT: 15 U/L (ref 0–35)
AST: 21 U/L (ref 0–37)
Albumin: 4 g/dL (ref 3.5–5.2)
BILIRUBIN DIRECT: 0.1 mg/dL (ref 0.0–0.3)
TOTAL PROTEIN: 7.1 g/dL (ref 6.0–8.3)
Total Bilirubin: 0.3 mg/dL (ref 0.2–1.2)

## 2013-10-23 MED ORDER — LORAZEPAM 1 MG PO TABS: 1.0000 mg | ORAL_TABLET | Freq: Two times a day (BID) | ORAL | Status: DC

## 2013-10-23 MED ORDER — MIRABEGRON ER 25 MG PO TB24: 25.0000 mg | ORAL_TABLET | Freq: Every day | ORAL | Status: DC

## 2013-10-23 MED ORDER — SERTRALINE HCL 50 MG PO TABS: ORAL_TABLET | ORAL | Status: DC

## 2013-10-23 NOTE — Progress Notes (Signed)
Subjective:    Patient ID: [name], female    DOB: 1929-01-11, 80 y.o.   MRN: [id number]  HPI 80 y/o WF here for a follow up visit... she has multiple medical problems including:  SSS- s/p pacer insertion;  Hx pericarditis;  Hyperchol;  GERD/ Divertics/ IBS;  DJD/ LBP;  Anxiety/ Depression;  Anemia/ Monoclonal Gammopathy/ Borderline B12 level...  ~  October 13, 2011:  85mo ROV & [name] is stable> still somewhat depressed but on Zoloft & getting counseling; she stopped meds on her own & we discussed this, asked her to restart meds so we could assess their benefit going forward & she understands;  She continues to have pacer checks w/ DrAllred, GI follow ups w/ DrDBrodie...    We reviewed prob list, meds, xrays and labs> see below>>  LABS 6/13:  CBC- wnl;  SPE- nonspecif pattern, no M-spike detected...   ~  April 14, 2012:  64mo ROV & daughter Dola Argyle spoke to me in private about pt's worsening memory, losing things, repeating herself, medication errors, etc;  We discussed starting Aricept & refer to Neuro for full eval & their suggestions... We reviewed the following problems during today's visit>>    SSS, Pacer> on ASA81; followed by DrAllred & last seen 6/13- doing well clinically, pacer funct normally but not pacing (see his note, reviewed)...    Chol> she stopped Simva on her own, but agrees to restart Simva20; last FLP 12/12 (on diet alone) showed TChol 242, TG 111, HDL 65, LDL 144    GI- GERD, Divertics, IBS> on Omep20 but not taking daily; last EGD/ Colon 4/12- she notes occas reflux sx but denies abd pain, n/v, c/d, blood seen...    DJD, LBP> on OTC analgesics, calcium, MVI, VitD;  She denies specific arthritic complaints...    Early Dementia, HOH> we decided to start Aricept 5=>10mg /d & refer to Neurology for full eval...    Anxiety, Depression> followed by [facility] on Zoloft50-3/d & Ativan1mg  prn; states she's doing OK...    Anemia & ?MGUS> we've been checking CBC & SPE/IEP  every June; last Hg=12.6 Jun2013 & SPE was neg w/o Mspike... We reviewed prob list, meds, xrays and labs> see below for updates >> she had the Flu vaccine 10/13...  ADDENDUM 07/13/12>> Fasting labs showed FLP at goals on Simva20, continue same;  Chems- wnl;  CBC- wnl;  TSH=1.72  ~  October 13, 2012:  36mo ROV & [name] indicates that she has been more depressed- she was seen by [facility] ([facility], DrCottle) & started on a new med (didn't bring med bottles or med list to the office today);  She is also on Zoloft50-3tabs daily & Ativan1mg - 1/2 to 1 tab Tid prn;  She is followed by DrDBrodie for GI- seen 4/14- Divertics, IBS, rectocele, rectal irritation & seepage w/ incompetent recal sphincter (treated w/ Questran, Calmoseptine paste & Analpram cream);  She is also c/o urinary incont & wears pads, she was started on Vesicare5mg  12/13 but wonders if she needs this & wants to wean off;  Finally she has had f/u w/ Neuro- DrWillis for memory on Aricept10...     SSS, Pacer> on ASA81; followed by DrAllred & last seen 6/13- doing well clinically, pacer funct normally but not pacing (see his note, reviewed) & she remains asymptomatic w/o CP, palpit, dizzy, SOB, edema...    Chol> back on Simva20; last FLP 3/14 (on Simva20) showed TChol 188, TG 123, HDL 65, LDL 99- much improved, continue  same...    GI- GERD, Divertics, IBS> on Omep20 but not taking daily; last EGD/ Colon 4/12- she notes occas reflux sx but denies abd pain, n/v, etc; followed by DrDBrodie w/ c/o IBS- alt diarrhea & constip, rectal irritation & seepage; treated w/ Questran, Calmoseptine paste & Analpram cream...     DJD, LBP> on OTC analgesics, calcium, MVI, VitD;  She notes intermittent mild right hip pain- Rx w/ Advil, heat, etc...    Early Dementia, HOH> on Aricept10mg /d & seen by DrWillis 12/13- MMSE=28/30 & animal fluency=8 in 30sec; they did CT (?result not avail) & considered adding Namenda, offered Alz research...    Anxiety, Depression>  followed by [facility] on Zoloft50-3/d & Ativan1mg  prn; recent incr depression symptoms & started on some new med by [employer]...    Anemia & ?MGUS> we've been checking CBC & SPE/IEP yearly; Hg=12.6 Jun2013 & SPE was neg w/o Mspike; f/u CBC 3/14 showed Hg= 13.7 We reviewed prob list, meds, xrays and labs> see below for updates >>   ~  December 27, 2012:  2-55mo ROV & [name] returns today w/ report that her BP was elevated at several f/u visits w/ [facility] at [facility]- she thinks in the 160 range but did not bring a note & we don't have direct communication from [place]; her BP here today is 130/70 & review of epic show all similar BPs over the last 4mo- highest reading was 141/77 at Neuro office 6/14; her exam is otherw neg as well- clear lung, normal cardiac exam, pacer functioning normally at pacer checks, no bruits, no edema, etc... She does not appear to need BP meds at this time & I gave her some tips to control BP & avoid meds in the future by elim sodium from her diet & losing a few lbs...    She saw DrDBrodie 6/14 w/ c/o diarrhea- known hx of GERD, IBS, divertics, rectocele & seepage w/ incompetent rectal sphincter;  She thought that the Aricept may be causing the diarrhea & rec stopping this med for several weeks to see if the prob resolved; pt reports that diarrhea resolved off the Aricept & she does not want to restart this med;  When she saw Cecille Rubin 6/14 pt did not want to start a new med for her memory but in the interval she has seen the need for something to help her memory; I told her I would forward this note to DrWillis & let her Neurologists choose her replacement med...     We reviewed prob list, meds, xrays and labs> see below for updates >>   ~  April 17, 2013:  16mo ROV & [name] is stable- lives at [employer], walks daily, nice people, etc; she notes her grandson & his wife are preg & this will be her 1st great grand;  Her BP has been good- no reported  episodes of elev BP etc just on diet control (and she has lost 7# down tp 157# today... We reviewed the following medical problems during today's office visit >>     SSS, Pacer> on ASA81; BP= 126/78; followed by DrAllred & last seen 6/13- doing well clinically, pacer funct normally but not pacing (see his note, reviewed) & she remains asymptomatic w/o CP, palpit, dizzy, SOB, edema...    Chol> on Simva20; FLP 3/14 showed TChol 188, TG 123, HDL 65, LDL 99- much improved, continue same...    GI- GERD, Divertics, IBS> on Omep20 but not taking daily; last  EGD/ Colon 4/12- she notes occas reflux sx but denies abd pain, n/v, etc; followed by DrDBrodie w/ c/o IBS- alt diarrhea & constip, rectal irritation & seepage; treated w/ Questran, Calmoseptine paste & Analpram cream...     DJD, LBP> on OTC analgesics, calcium, MVI, VitD;  She notes intermittent mild right hip pain- Rx w/ Advil, heat, etc...    Early Dementia, HOH> on Aricept10mg /d & seen by DrWillis 12/13- MMSE=28/30 & animal fluency=8 in 30sec; they did CT (?result not avail); she thinks the Aricept caused diarrhea so she stopped it- Neuro added Namenda10Bid...    Anxiety, Depression> followed by [facility] on Zoloft50-2/d & Ativan1mg  prn; SHE DID NOT BRING MED LIST OR BOTTLES TO THE OFFICE FOR REVIEW.    Anemia & ?MGUS> we've been checking CBC & SPE/IEP yearly; Hg=12.6 Jun2013 & SPE was neg w/o Mspike; f/u CBC 3/14 showed Hg= 13.7 We reviewed prob list, meds, xrays and labs> see below for updates >> she had the 2014 Flu vaccine in Oct...  ~  October 23, 2013:  84mo ROV & [name] is stable, feeling OK & has no new complaints or concerns; she tells me she is unhappy w/ her psychiatric care, always sees a therapist that she doesn't like & never sees the doctor; they have her on Buspar5mg Bid, Ativan 1mg Bid, and Zoloft50mg -2/d; she wants to wean off the Buspar & wants me to write the Ativan & Zoloft rx; she is encouraged to discuss w/ her psychiatric team  or request a new referral, for now she will decr the Buspar to 5mg /d & let me know how she is doing...    She has hx SSS & has pacer followed by DrAllred w/ pacer checks and seen annually doing satis...    Hx hypercholesterolemia on Simva20 w/ good control when she takes it regularly, but she has again stopped the statin Rx on her own! FLP 6/15 showed TChol 295, TG 109, HDL 69, LDL 204 (Rec to restart the Simva20)...    She has GERD, divertics, IBS> last colon 4/12 by drDBrodie was ok x few divertics, no polyps etc...    She notes urinary leakage and "gotta go" symptoms only min improved w/ Vesicare5; we decided to change to [facility] & monitor her symptoms...    She remains on Namenda10Bid per Neuro & saw CMartin1/15> intol to Aricept & Exelon; prev CT Brain 1/14 showed 42mm right thalamic hypodensity (chr lacunar infarct) & mild atrophy... We reviewed prob list, meds, xrays and labs> see below for updates >>   LABS 6/15:  FLP- way off on diet alone;  Chems- ok x BS=117 (Rec low carb diet);  CBC- wnl;  TSH=1.72...           Problem List:   HEARING LOSS (ICD-389.9) - she wears bilat hearing aides & has been tested by DrPahel...  ALLERGIC RHINITIS (ICD-477.9) - uses OTC antihistamines Prn...  SICK SINUS SYNDROME (ICD-427.81) - s/p pacemaker placed in 1996 for SSS... yearly pacer f/u by DrBrodie- generator changed 10/09 & seen 10/10 doing well... ~  She now follows up w/ DrAllred annually & doing well w/o CP, palpit, dizzy, SOB, edema, etc.... ~  6/13: she had f/u DrAllred> doing well, remains active, denies CP/palpit/SOB/syncope/etc; pacer function normally but atrial lead dysfunction, they are following... ~  6/15: She has hx SSS & has pacer followed by DrAllred w/ pacer checks and seen annually doing satis...  Hx of PERICARDITIS (ICD-423.9) - hosp in 2001 w/ CP & acute pericarditis... serial Echo's  showed resolution of the effusion... ~  2DEcho 5/04 showed a prob patent foramen ovale w/  left to right interatrial shunt by doppler... DrBBrodie is aware & following. ~  NuclearStressTest 5/04 was negative- no ischemia, no infarct, EF= 65%... ~  repeat Nuclear Stress Test 1/10 was neg- no scar or ischemia, EF=74%...  HYPERCHOLESTEROLEMIA (ICD-272.0) - on SIMVASTATIN 20mg /d & low fat diet... ~  [place] 7/08 showed TChol 196, TG 80, HDL 53, LDL 127... ~  [place] 6/09 off med showed TChol 237, TG 106, HDL 57, LDL 147... restart Simva20. ~  [place] 9/09 on Simva20 showed TChol 147, TG 73, HDL 55, LDL 78 ~  FLP 10/10 on Simva20 showed TChol 172, TG 95, HDL 58, LDL 95 ~  FLP 4/12 off meds showed TChol 277, TG 85, HDL 66, LDL 185... rec to restart Simv20 daily... ~  [place] 12/12 on Simva20 showed TChol 242, TG 111, HDL 65, LDL 144... Admits not taking med regularly, advised to take it every day!!! ~  12/13:  She is not taking her statin; she agrees to restart Simva20 & will recheck FLP in 2-3 mo... ~  [place] 3/14 on Simva20 showed TChol 188, TG 123, HDL 65, LDL 99... Much improved, continue med... ~  [place] 6/15 off her Simva20 showed TChol 295, TG 109, HDL 69, LDL 204... She will restart the Simva20 & stay on it!  GERD (ICD-530.81) - uses OTC PRILOSEC 20mg  Prn... ~  4/12:  EGD by DrDBrodie showed HH, esophagitis, mild gastritis, mult tiny gastric polyps, neg HPylori...  DIVERTICULOSIS OF COLON (ICD-562.10) - prev colonoscopy 12/02 by Rance Muir w/ divertics only... there is a +fam hx of colon cancer in her youngest daughter... f/u colonoscopy is overdue (she refused). ~  4/12: she finally agreed to f/u colon, done by DrDBrodie> mild divertics, otherw neg, rec to incr fiber...  IRRITABLE BOWEL SYNDROME (ICD-564.1) - trial BENTYL 20mg  Prn for abd cramping... ~  last saw DrDBrodie for GI 2/10 w/ diarrhea, hx IBS, +FamHx ColonCa>> Rx Flagyl, Probiotic; pt refused colonoscopy. ~  Hosp 1/11 w/ severe gastroenteritis/ diarrhea/ mild dehydration- CDiff neg & Lactoferrin pos- treated w/ Cipro/ Flagyl/ Florastor &  resolved. ~  2014> she had f/u w/ GI, DrDBrodie for Divertics, IBS, rectocele, rectal irritation & seepage w/ incompetent recal sphincter (treated w/ Questran, Calmoseptine paste & Analpram cream)... ~  6/14:  She saw DrDBrodie 6/14 w/ c/o diarrhea- known hx of GERD, IBS, divertics, rectocele & seepage w/ incompetent rectal sphincter;  She thought that the Aricept may be causing the diarrhea & rec stopping this med for several weeks to see if the prob resolved; pt reports that diarrhea resolved off the Aricept & she does not want to restart this med... ~  She thought the Aricept was poss causing the diarrhea so shje stopped this on her own...  DEGENERATIVE JOINT DISEASE (ICD-715.90) - she has mild-mod DJD (hands, etc) and Rx w/ Advil, Glucosamine...  Hx of BACK PAIN, LUMBAR (ICD-724.2) - eval by DrWillis in 2005 w/ LBP, some leg paresthesias and MRI showing DDD and mild sp stenosis... Rx'd w/ Aleve and Neurontin at that time...  MEMORY LOSS >> prev on ARICEPT10 but she thought it caused diarrhea & stopped 6/14... ~  She had Neuro eval DrWillis 12/13- MMSE=28/30 & animal fluency=8 in 30sec; they did CT (?result not avail) & rec continue Aricept10 & consider adding Namenda, offered Alz research... ~  6/14:  She developed diarrhea on the Aricept which was held by DrDBrodie &  the diarrhea resolved; she was started on Namenda10Bid in it's place... ~  6/15:  She remains on Namenda10Bid per Neuro & saw CMartin1/15> intol to Aricept & Exelon; prev CT Brain 1/14 showed 72mm right thalamic hypodensity (chr lacunar infarct) & mild atrophy...  ANXIETY DEPRESSION (ICD-300.4) - she is quite anxious and uses ATIVAN 1mg - 1/2 to 1 tab Tid Prn + ZOLOFT 50mg - 2 daily (she prev tried weaning off the Zoloft but felt worse off this med)... she notes that depression runs in her family. ~  12/13:  followed by [facility] on Zoloft50-3/d & Ativan1mg  prn; states she's doing OK... ~  6/14:   followed by [facility] on Zoloft50-3/d & Ativan1mg  prn; recent incr depression symptoms & started on some new med by [employer]... ~  12/14:  SHE DID NOT BRING MED BOTTLES OR LIST TO THE OFFICE TODAY- Zoloft50-2/d & Ativan1mg  prn are listed... ~  6/15:  she tells me she is unhappy w/ her psychiatric care, always sees a therapist that she doesn't like & never sees the doctor; they have her on Buspar5mg Bid, Ativan 1mg Bid, and Zoloft50mg -2/d; she wants to wean off the Buspar & wants me to write the Ativan & Zoloft rx; she is encouraged to discuss w/ her psychiatric team or request a new referral, for now she will decr the Buspar to 5mg /d & let me know how she is doing...  ANEMIA (ICD-285.9) & ?MONOCLONAL GAMMOPATHY? - diagnosed 12/10 as below>> rec to start WOMENS MULTIVIT Daily & VIT B12 1070mcg daily... 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 we plan f/u B12 & SPE/ IEP... ~  Labs 4/12 Hg= 13.2, B12= 420, SPE no Mspike detected... ~  Labs 6/13 showed Hg= 12.6, SPE- neg, no Mspike & nonspecific pattern... ~  Labs 3/14 showed Hg= 13.7 ~  Labs 6/15 showed Hg= 13.6   Past Surgical History  Procedure Laterality Date  . Pacemaker placement      most recent generator replaced 2009 by Dr Olevia Perches  . Appendectomy    . Dilation and curettage of uterus  03/24/10    Outpatient Encounter Prescriptions as of 10/23/2013  Medication Sig  . Biotin 1000 MCG tablet Take 5,000 mcg by mouth daily.   . busPIRone (BUSPAR) 5 MG tablet Take 5 mg by mouth 2 (two) times daily.   . hydrocortisone-pramoxine (ANALPRAM-HC) 2.5-1 % rectal cream Place rectally as needed.  Marland Kitchen ibuprofen (ADVIL,MOTRIN) 200 MG tablet as needed.    Marland Kitchen LORazepam (ATIVAN) 1 MG tablet Take 1 mg by mouth 2 (two) times daily.   . memantine (NAMENDA) 10 MG tablet Take 1 tablet (10 mg total) by mouth 2 (two) times daily.  Marland Kitchen omeprazole (PRILOSEC)  20 MG capsule Take 1 capsule (20 mg total) by mouth as needed.  . sertraline (ZOLOFT) 50 MG tablet TAKE 2 TABLETS BY MOUTH ONCE DAILY  . VESICARE 5 MG tablet TAKE 1 TABLET EVERY DAY  . [DISCONTINUED] simvastatin (ZOCOR) 20 MG tablet TAKE 1 TABLET BY MOUTH AT BEDTIME    Allergies  Allergen Reactions  . Aricept [Donepezil Hcl]     Diarrhea  . Exelon [Rivastigmine Tartrate]     Diarrhea    Current Medications, Allergies, Past Medical History, Past Surgical History, Family History, and Social History were reviewed in [employer] record.    Review of Systems        See  HPI - all other systems are neg except as noted... The patient complains of decreased hearing.  The patient denies anorexia, fever, weight loss, weight gain, vision loss, hoarseness, chest pain, syncope, dyspnea on exertion, peripheral edema, prolonged cough, headaches, hemoptysis, abdominal pain, melena, hematochezia, severe indigestion/heartburn, hematuria, incontinence, muscle weakness, suspicious skin lesions, transient blindness, difficulty walking, depression, unusual weight change, abnormal bleeding, enlarged lymph nodes, and angioedema.     Objective:   Physical Exam     WD, WN, 80 y/o WF in NAD... GENERAL:  Alert & oriented; pleasant & cooperative... HEENT:  [place]/AT, EOM-wnl, PERRLA, EACs- hearing aides bilat & left EAC exud/ sl red/ inflammed, NOSE-clear, THROAT-clear & wnl. NECK:  Supple w/ full ROM; no JVD; normal carotid impulses w/o bruits; no thyromegaly or nodules palpated; no lymphadenopathy. CHEST:  Clear to P & A; without wheezes/ rales/ or rhonchi. HEART:  Regular Rhythm; without murmurs/ rubs/ or gallops. ABDOMEN:  Soft & nontender; normal bowel sounds; no organomegaly or masses detected. EXT: without deformities, mild arthritic changes; no varicose veins/ +venous insuffic/ no edema. NEURO:  CN's intact;  no focal neuro deficits... DERM:  No lesions noted; no rash  etc...  RADIOLOGY DATA:  Reviewed in the EPIC EMR & discussed w/ the patient...  LABORATORY DATA:  Reviewed in the EPIC EMR & discussed w/ the patient...   Assessment & Plan:    Hearing Loss>  She has bilat hearing aides...  Cardiac> SSS, Pacer, Hx pericarditis>  BP is ok; followed by DrAllred & denies arrhythmias, palpit, etc... She has a known pacer lead problem...  Hyperchol>  On Simva20 & repeat FLP off med is way off- she will restart the Simva20...  HH/ GERD>  S/p EGD 4/12 by DrDBrodie; pt taking OTC Prilosec daily...  Divertics/ IBS/ leakage>  Colonoscopy 4/12 was essent neg, no polyps; leakage from rectocele & managed by GI- DrBrodie...  DJD/ LBP>  She has seen DrWillis in the past for the LBP; taking Advil, Tylenol, OTC meds...  MEMORY LOSS>  Prev on Aricept per DrWillis but this was stopped 6/14 due to diarrhea & they switched to Namenda10Bid & well tol...  Anxiety/ Depression>  On Buspar, Zoloft & Ativan... She is not happy w/ [facility] & is asked to discuss w/ her psychiatrist, she wants to wean off the Buspar & doesn't want to return to them...  ?MONOCLONAL GAMMOPATHY? - MGUS detected 12/10, but all studies have been neg since then...   Patient's Medications  New Prescriptions   MIRABEGRON ER (MYRBETRIQ) 25 MG TB24 TABLET    Take 1 tablet (25 mg total) by mouth daily.  Previous Medications   BIOTIN 1000 MCG TABLET    Take 5,000 mcg by mouth daily.    BUSPIRONE (BUSPAR) 5 MG TABLET    Take 5 mg by mouth daily.    HYDROCORTISONE-PRAMOXINE (ANALPRAM-HC) 2.5-1 % RECTAL CREAM    Place rectally as needed.   IBUPROFEN (ADVIL,MOTRIN) 200 MG TABLET    as needed.     MEMANTINE (NAMENDA) 10 MG TABLET    Take 1 tablet (10 mg total) by mouth 2 (two) times daily.   OMEPRAZOLE (PRILOSEC) 20 MG CAPSULE    Take 1 capsule (20 mg total) by mouth as needed.  Modified Medications   Modified Medication Previous Medication   LORAZEPAM (ATIVAN) 1 MG TABLET LORazepam (ATIVAN) 1 MG  tablet      Take 1 tablet (1 mg total) by mouth 2 (two) times daily. As needed for anxiety  Take 1 mg by mouth 2 (two) times daily. As needed for anxiety   SERTRALINE (ZOLOFT) 50 MG TABLET sertraline (ZOLOFT) 50 MG tablet      TAKE 2 TABLETS BY MOUTH ONCE DAILY for depression    TAKE 2 TABLETS BY MOUTH ONCE DAILY  Discontinued Medications   SERTRALINE (ZOLOFT) 50 MG TABLET    TAKE 2 TABLETS BY MOUTH ONCE DAILY for depression   SIMVASTATIN (ZOCOR) 20 MG TABLET    TAKE 1 TABLET BY MOUTH AT BEDTIME   VESICARE 5 MG TABLET    TAKE 1 TABLET EVERY DAY

## 2013-10-23 NOTE — Patient Instructions (Signed)
Today we updated your med list in our EPIC system...     We decided to change the Vesicare to [facility] 25mg - one tab daily for your bladder...  OK to cut back on the BUSPAR to one daily til gone...  We refilled your ZOLOFT & ATIVAN per request...   Today we did your follow up FASTING blood work...    We will contact you w/ the results when available...   You should re-start your SIMVASTATIN cholesterol med (20mg ) daily at bedtime...  Call for any questions.Marland KitchenMarland Kitchen

## 2013-10-26 ENCOUNTER — Telehealth: Payer: Self-pay | Admitting: Pulmonary Disease

## 2013-10-26 MED ORDER — SERTRALINE HCL 100 MG PO TABS: ORAL_TABLET | ORAL | Status: DC

## 2013-10-26 NOTE — Telephone Encounter (Signed)
Called and spoke with pt and she got her bottle out and read off the mg and the directions to me. Per SN ok to send in the rx for the zoloft 100 mg  2 po at bedtime.  This has been sent to the pharmacy.  i advised the pt that she will need to bring her medicine bottles with her to each of her doctor visits to make sure that she is taking the correct meds.  Pt voiced her understanding and nothing further is needed.

## 2013-10-26 NOTE — Telephone Encounter (Signed)
Spoke with pharmacist.  She states that pt's dose for Zoloft has been 100mg  2 tablets daily since they have been filling this for her in 04/2013.  Please advise if ok to change rx that was sent in for her to 100mg  tablets instead of 50 mg.

## 2013-11-13 ENCOUNTER — Telehealth: Payer: Self-pay | Admitting: Neurology

## 2013-11-13 ENCOUNTER — Ambulatory Visit: Payer: Medicare Other | Admitting: Neurology

## 2013-11-13 ENCOUNTER — Other Ambulatory Visit: Payer: Self-pay

## 2013-11-13 NOTE — Telephone Encounter (Signed)
Patient calling again to state that she has spoken with daughter and she doesn't want to see the NP, just wants to remain on waitlist to see Dr. Jannifer Franklin.

## 2013-11-13 NOTE — Telephone Encounter (Signed)
Patient's daughter Manuela Schwartz calling to schedule a sooner appointment than the one scheduled to see Dr. Jannifer Franklin, states that patient is getting more and more forgetful and depressed. Please return call to patient's daughter and advise.

## 2013-11-13 NOTE — Telephone Encounter (Signed)
Pt called back and stated that she does not want to see a NP and that she would just remain on the wait list.

## 2013-11-13 NOTE — Telephone Encounter (Signed)
Called patient to schedule appointment with NP MM, patient wanted to speak with her daughter first to decide which day would be better for her daughter, states she will call back after she has spoken with her daughter.

## 2013-11-14 ENCOUNTER — Ambulatory Visit (INDEPENDENT_AMBULATORY_CARE_PROVIDER_SITE_OTHER): Payer: 59 | Admitting: Professional

## 2013-11-14 DIAGNOSIS — F4323 Adjustment disorder with mixed anxiety and depressed mood: Secondary | ICD-10-CM

## 2013-11-16 ENCOUNTER — Encounter: Payer: Self-pay | Admitting: Internal Medicine

## 2013-11-16 ENCOUNTER — Ambulatory Visit (INDEPENDENT_AMBULATORY_CARE_PROVIDER_SITE_OTHER): Payer: Medicare Other | Admitting: Internal Medicine

## 2013-11-16 VITALS — BP 120/75 | HR 70 | Ht 63.0 in | Wt 150.1 lb

## 2013-11-16 DIAGNOSIS — Z950 Presence of cardiac pacemaker: Secondary | ICD-10-CM

## 2013-11-16 DIAGNOSIS — R109 Unspecified abdominal pain: Secondary | ICD-10-CM

## 2013-11-16 DIAGNOSIS — I495 Sick sinus syndrome: Secondary | ICD-10-CM

## 2013-11-16 LAB — MDC_IDC_ENUM_SESS_TYPE_INCLINIC
Date Time Interrogation Session: 20150709140607
Implantable Pulse Generator Serial Number: 1234823
Lead Channel Impedance Value: 471 Ohm
Lead Channel Pacing Threshold Pulse Width: 0.5 ms
Lead Channel Sensing Intrinsic Amplitude: 4 mV
MDC IDC MSMT BATTERY IMPEDANCE: 1000 Ohm — AB
MDC IDC MSMT BATTERY REMAINING LONGEVITY: 120 mo
MDC IDC MSMT BATTERY VOLTAGE: 2.79 V
MDC IDC MSMT LEADCHNL RV PACING THRESHOLD AMPLITUDE: 1.5 V
MDC IDC SET LEADCHNL RV PACING AMPLITUDE: 2.5 V
MDC IDC SET LEADCHNL RV PACING PULSEWIDTH: 0.5 ms
MDC IDC SET LEADCHNL RV SENSING SENSITIVITY: 2 mV
MDC IDC STAT BRADY RV PERCENT PACED: 1 % — AB

## 2013-11-16 NOTE — Progress Notes (Signed)
PCP: Noralee Space, MD  The patient presents today for routine electrophysiology followup.  Since last being seen in our clinic, the patient reports doing very well.  She remains very active for her age.  She has been diagnosed with short term memory troubles but seems to be doing well with this.  Today, she denies symptoms of palpitations, chest pain, shortness of breath, orthopnea, PND, lower extremity edema, dizziness, presyncope, syncope, or neurologic sequela.  The patient feels that she is tolerating medications without difficulties and is otherwise without complaint today.   Past Medical History  Diagnosis Date  . Hearing loss   . Allergic rhinitis   . Sick sinus syndrome   . Presence of permanent cardiac pacemaker   . History of pericarditis   . Hypercholesteremia   . GERD (gastroesophageal reflux disease)   . IBS (irritable bowel syndrome)   . Diverticulosis of colon   . DJD (degenerative joint disease)   . Lumbar back pain   . Anxiety and depression   . Anemia   . Monoclonal gammopathy   . Anxiety   . Cataract   . Depression   . Osteoporosis     osteopenia  . Hiatal hernia   . Gastric polyps   . Memory loss    Past Surgical History  Procedure Laterality Date  . Pacemaker placement      most recent generator replaced 2009 by Dr Olevia Perches  . Appendectomy    . Dilation and curettage of uterus  03/24/10    Current Outpatient Prescriptions  Medication Sig Dispense Refill  . Biotin 1000 MCG tablet Take 1,000 mcg by mouth daily.       . busPIRone (BUSPAR) 5 MG tablet Take 5 mg by mouth daily. Pt states she is tapering off of this medication (11/16/13)      . hydrocortisone-pramoxine (ANALPRAM-HC) 2.5-1 % rectal cream Place rectally as needed.      Marland Kitchen ibuprofen (ADVIL,MOTRIN) 200 MG tablet Take 200 mg by mouth as needed.       Marland Kitchen LORazepam (ATIVAN) 1 MG tablet Take 1 mg by mouth 2 (two) times daily as needed for anxiety.      . memantine (NAMENDA) 10 MG tablet Take 1 tablet (10  mg total) by mouth 2 (two) times daily.  60 tablet  5  . omeprazole (PRILOSEC) 20 MG capsule Take 1 capsule (20 mg total) by mouth as needed.  90 capsule  3  . sertraline (ZOLOFT) 100 MG tablet Take 2 tablets by mouth at bedtime.  60 tablet  5  . VESICARE 5 MG tablet Take 1 tablet by mouth daily.      . mirabegron ER (MYRBETRIQ) 25 MG TB24 tablet Take 25 mg by mouth daily. Pt will start this medication when her Vesicare runs out (11/16/13)       No current facility-administered medications for this visit.    Allergies  Allergen Reactions  . Aricept [Donepezil Hcl]     Diarrhea  . Exelon [Rivastigmine Tartrate]     Diarrhea    History   Social History  . Marital Status: Widowed    Spouse Name: N/A    Number of Children: 3  . Years of Education: 12   Occupational History  .      retired   Social History Main Topics  . Smoking status: Former Smoker -- 0.50 packs/day for 10 years    Types: Cigarettes    Quit date: 05/12/1987  . Smokeless tobacco: Never Used  .  Alcohol Use: 0.6 oz/week    1 Glasses of wine per week     Comment: wine occasionally  . Drug Use: No  . Sexual Activity: Not on file   Other Topics Concern  . Not on file   Social History Narrative   Pt has 12 siblings.   Alcohol use- no   Drug use- no   Daily caffeine use.      Patient is a widow.   Retired   [employer] school education.             Family History  Problem Relation Age of Onset  . Heart failure Mother   . Heart attack Father   . Parkinsonism Sister   . Rectal cancer Daughter   . Colon cancer Daughter   . Pancreatic cancer Brother   . Pancreatic cancer Sister   . Brain cancer Brother   . Heart disease Brother   . Colon cancer Daughter   . Diabetes Sister   . Clotting disorder Sister   . Lung cancer Sister     twin  . Crohn's disease Sister     twin    Physical Exam: Filed Vitals:   11/16/13 1037  BP: 120/75  Pulse: 70  Height: 5\' 3"  (1.6 m)  Weight: 150 lb 1.9 oz (68.094  kg)    GEN- The patient is well appearing, alert and oriented x 3 today.   Head- normocephalic, atraumatic Eyes-  Sclera clear, conjunctiva pink Ears- hearing intact Oropharynx- clear Neck- supple, no JVP Lymph- no cervical lymphadenopathy Lungs- Clear to ausculation bilaterally, normal work of breathing Chest- pacemaker pocket is well healed Heart- Regular rate and rhythm, no murmurs, rubs or gallops, PMI not laterally displaced GI- soft, NT, ND, + BS Extremities- no clubbing, cyanosis, or edema  Pacemaker interrogation- reviewed in detail today,  See PACEART report  Assessment and Plan:  1. Sick sinus syndrome Normal pacemaker function See Pace Art report No changes today  2. Short term memory impairment Stable No change required today  Return to the device clinic in 6 months I will see again in 1 year

## 2013-11-16 NOTE — Patient Instructions (Signed)
Your physician recommends that you continue on your current medications as directed. Please refer to the Current Medication list given to you today. Your physician recommends that you schedule a follow-up appointment in: 6 months with device clinic.  Your physician wants you to follow-up in: 1 year with Dr. Rayann Heman.   You will receive a reminder letter in the mail two months in advance. If you don't receive a letter, please call our office to schedule the follow-up appointment.

## 2013-11-23 ENCOUNTER — Encounter: Payer: Self-pay | Admitting: Internal Medicine

## 2013-11-28 ENCOUNTER — Ambulatory Visit: Payer: 59 | Admitting: Professional

## 2013-12-14 NOTE — Telephone Encounter (Signed)
Noted  

## 2014-02-27 ENCOUNTER — Ambulatory Visit: Payer: Medicare Other | Admitting: Neurology

## 2014-03-28 ENCOUNTER — Encounter: Payer: Self-pay | Admitting: Neurology

## 2014-04-03 ENCOUNTER — Encounter: Payer: Self-pay | Admitting: Neurology

## 2014-04-26 ENCOUNTER — Ambulatory Visit: Payer: Medicare Other | Admitting: Neurology

## 2014-05-10 ENCOUNTER — Encounter: Payer: Self-pay | Admitting: Neurology

## 2014-05-10 ENCOUNTER — Ambulatory Visit (INDEPENDENT_AMBULATORY_CARE_PROVIDER_SITE_OTHER): Payer: Medicare Other | Admitting: Neurology

## 2014-05-10 VITALS — BP 123/69 | HR 77 | Ht 63.5 in | Wt 155.0 lb

## 2014-05-10 DIAGNOSIS — R413 Other amnesia: Secondary | ICD-10-CM

## 2014-05-10 DIAGNOSIS — F341 Dysthymic disorder: Secondary | ICD-10-CM

## 2014-05-10 NOTE — Progress Notes (Signed)
Reason for visit: Memory problems  [name] is an 80 y.o. female  History of present illness:  [name] is an 80 year old right-handed white female with a mild memory disturbance, and a problem with anxiety and depression. The patient has had some worsening depression recently. The patient still operates motor vehicle, and she lives in an independent living center. The patient recently had some problems with getting lost trying to get to her daughter's house. She indicates that she sleeps well, but she does occasionally have some problems with panic attacks. She is on Namenda, she has not been able to tolerate Aricept or Exelon previously secondary to diarrhea. She comes to this office for an evaluation. Recently, she was placed on Prozac for her depression.  Past Medical History  Diagnosis Date  . Hearing loss   . Allergic rhinitis   . Sick sinus syndrome   . Presence of permanent cardiac pacemaker   . History of pericarditis   . Hypercholesteremia   . GERD (gastroesophageal reflux disease)   . IBS (irritable bowel syndrome)   . Diverticulosis of colon   . DJD (degenerative joint disease)   . Lumbar back pain   . Anxiety and depression   . Anemia   . Monoclonal gammopathy   . Anxiety   . Cataract   . Depression   . Osteoporosis     osteopenia  . Hiatal hernia   . Gastric polyps   . Memory loss     Past Surgical History  Procedure Laterality Date  . Pacemaker placement      most recent generator replaced 2009 by Dr Olevia Perches  . Appendectomy    . Dilation and curettage of uterus  03/24/10    Family History  Problem Relation Age of Onset  . Heart failure Mother   . Heart attack Father   . Parkinsonism Sister   . Rectal cancer Daughter   . Colon cancer Daughter   . Pancreatic cancer Brother   . Pancreatic cancer Sister   . Brain cancer Brother   . Heart disease Brother   . Colon cancer Daughter   . Diabetes Sister   . Clotting disorder Sister   . Lung  cancer Sister     twin  . Crohn's disease Sister     twin    Social history:  reports that she quit smoking about 27 years ago. Her smoking use included Cigarettes. She has a 5 pack-year smoking history. She has never used smokeless tobacco. She reports that she drinks about 0.6 oz of alcohol per week. She reports that she does not use illicit drugs.    Allergies  Allergen Reactions  . Aricept [Donepezil Hcl]     Diarrhea  . Exelon [Rivastigmine Tartrate]     Diarrhea    Medications:  Current Outpatient Prescriptions on File Prior to Visit  Medication Sig Dispense Refill  . Biotin 1000 MCG tablet Take 1,000 mcg by mouth daily.     . hydrocortisone-pramoxine (ANALPRAM-HC) 2.5-1 % rectal cream Place rectally as needed.    Marland Kitchen ibuprofen (ADVIL,MOTRIN) 200 MG tablet Take 200 mg by mouth as needed.     Marland Kitchen LORazepam (ATIVAN) 1 MG tablet Take 1 mg by mouth 2 (two) times daily as needed for anxiety.    . memantine (NAMENDA) 10 MG tablet Take 1 tablet (10 mg total) by mouth 2 (two) times daily. 60 tablet 5  . VESICARE 5 MG tablet Take 1 tablet by mouth daily.  No current facility-administered medications on file prior to visit.    ROS:  Out of a complete 14 system review of symptoms, the patient complains only of the following symptoms, and all other reviewed systems are negative.  Hearing loss Incontinence of the bowels and bladder Joint pain, joint swelling, back pain, neck stiffness Moles Memory loss, dizziness Agitation, confusion, decreased concentration, depression, anxiety  Blood pressure 123/69, pulse 77, height 5' 3.5" (1.613 m), weight 155 lb (70.308 kg).  Physical Exam  General: The patient is alert and cooperative at the time of the examination.  Skin: No significant peripheral edema is noted.   Neurologic Exam  Mental status: The Mini-Mental Status Examination done today shows a total score of 30/30. The animal fluency test score is 11.  Cranial nerves:  Facial symmetry is present. Speech is normal, no aphasia or dysarthria is noted. Extraocular movements are full. Visual fields are full.  Motor: The patient has good strength in all 4 extremities.  Sensory examination: Soft touch sensation is symmetric on the face, arms, and legs.  Coordination: The patient has good finger-nose-finger and heel-to-shin bilaterally.  Gait and station: The patient has a normal gait. Tandem gait is unsteady. Romberg is negative. No drift is seen.  Reflexes: Deep tendon reflexes are symmetric.   Assessment/Plan:  1. Memory disorder  2. Anxiety and depression  The patient is doing relatively well in terms of her memory issues, with no perceivable progression in her memory over several years. The patient is having a lot of problems with depression and anxiety, and she wishes to see another therapist and psychiatrist. The patient will be referred to Dr. Letta Moynahan. The patient will follow-up through this office in about 8 months. She may wish to try coconut oil for several months to see if she gets benefit with this therapy.  Jill Alexanders MD 05/10/2014 12:57 PM  [facility] [address] [place], [place] [address]  Phone [phone] Fax [phone]

## 2014-05-15 ENCOUNTER — Telehealth: Payer: Self-pay

## 2014-05-15 MED ORDER — LORAZEPAM 1 MG PO TABS: 1.0000 mg | ORAL_TABLET | Freq: Two times a day (BID) | ORAL | Status: DC | PRN

## 2014-05-15 NOTE — Telephone Encounter (Signed)
[facility] sent [place] a fax asking if Dr Jannifer Franklin would be agreeable to prescribing Lorazepam 1mg  tabs with directions of one tablet twice daily as needed for anxiety.  Rx was previously prescribed by Dr Lenna Gilford.  They state patient is still waiting for a call back from Dr Arvil Persons office to schedule appt.  Please advise.  Thank you.

## 2014-05-15 NOTE — Telephone Encounter (Signed)
I will write the prescription for lorazepam for this patient.

## 2014-05-29 ENCOUNTER — Telehealth: Payer: Self-pay | Admitting: Neurology

## 2014-05-29 NOTE — Telephone Encounter (Signed)
Patient stated she hadn't from Dr. Tomasita Crumble McKinney's office regarding appointment.  Please call and advise.

## 2014-05-29 NOTE — Telephone Encounter (Signed)
Left message that I spoke to Dr. Tomasita Crumble Mckinney's office and they relayed that the patient can just call directly to set up the appointment.  I left message for patient to call office at [phone] speak to [facility] and schedule appointment, a referral is not necessary.

## 2014-06-07 DIAGNOSIS — E559 Vitamin D deficiency, unspecified: Secondary | ICD-10-CM | POA: Diagnosis not present

## 2014-06-07 DIAGNOSIS — F418 Other specified anxiety disorders: Secondary | ICD-10-CM | POA: Diagnosis not present

## 2014-06-07 DIAGNOSIS — F0390 Unspecified dementia without behavioral disturbance: Secondary | ICD-10-CM | POA: Diagnosis not present

## 2014-06-17 ENCOUNTER — Other Ambulatory Visit: Payer: Self-pay

## 2014-06-17 MED ORDER — MEMANTINE HCL 10 MG PO TABS: 10.0000 mg | ORAL_TABLET | Freq: Two times a day (BID) | ORAL | Status: DC

## 2014-06-20 DIAGNOSIS — F418 Other specified anxiety disorders: Secondary | ICD-10-CM | POA: Diagnosis not present

## 2014-06-20 DIAGNOSIS — Z6827 Body mass index (BMI) 27.0-27.9, adult: Secondary | ICD-10-CM | POA: Diagnosis not present

## 2014-06-29 ENCOUNTER — Telehealth: Payer: Self-pay | Admitting: Neurology

## 2014-06-29 NOTE — Telephone Encounter (Signed)
I called the patient and I talked with the daughter. The patient traveled to visit the daughter and forgot her medication. The primary care MD gave her a small Rx to get her through the next several days.

## 2014-06-29 NOTE — Telephone Encounter (Signed)
Patient out of [place] please call asap @ [phone].  Please call and advise.

## 2014-07-04 ENCOUNTER — Encounter: Payer: Self-pay | Admitting: *Deleted

## 2014-07-11 DIAGNOSIS — Z6827 Body mass index (BMI) 27.0-27.9, adult: Secondary | ICD-10-CM | POA: Diagnosis not present

## 2014-07-11 DIAGNOSIS — R8290 Unspecified abnormal findings in urine: Secondary | ICD-10-CM | POA: Diagnosis not present

## 2014-07-11 DIAGNOSIS — N390 Urinary tract infection, site not specified: Secondary | ICD-10-CM | POA: Diagnosis not present

## 2014-07-11 DIAGNOSIS — E785 Hyperlipidemia, unspecified: Secondary | ICD-10-CM | POA: Diagnosis not present

## 2014-07-11 DIAGNOSIS — F418 Other specified anxiety disorders: Secondary | ICD-10-CM | POA: Diagnosis not present

## 2014-07-11 DIAGNOSIS — R42 Dizziness and giddiness: Secondary | ICD-10-CM | POA: Diagnosis not present

## 2014-07-19 DIAGNOSIS — R42 Dizziness and giddiness: Secondary | ICD-10-CM | POA: Diagnosis not present

## 2014-07-19 DIAGNOSIS — E785 Hyperlipidemia, unspecified: Secondary | ICD-10-CM | POA: Diagnosis not present

## 2014-07-19 DIAGNOSIS — I6350 Cerebral infarction due to unspecified occlusion or stenosis of unspecified cerebral artery: Secondary | ICD-10-CM | POA: Diagnosis not present

## 2014-07-20 ENCOUNTER — Encounter: Payer: Self-pay | Admitting: *Deleted

## 2014-08-01 ENCOUNTER — Ambulatory Visit (INDEPENDENT_AMBULATORY_CARE_PROVIDER_SITE_OTHER): Payer: Medicare Other | Admitting: *Deleted

## 2014-08-01 DIAGNOSIS — R001 Bradycardia, unspecified: Secondary | ICD-10-CM | POA: Diagnosis not present

## 2014-08-01 DIAGNOSIS — I495 Sick sinus syndrome: Secondary | ICD-10-CM | POA: Diagnosis not present

## 2014-08-01 LAB — MDC_IDC_ENUM_SESS_TYPE_INCLINIC
Battery Impedance: 1000 Ohm — CL
Battery Remaining Longevity: 120 mo
Battery Voltage: 2.79 V
Brady Statistic RV Percent Paced: 1 %
Date Time Interrogation Session: 20160323112855
Implantable Pulse Generator Model: 5826
Implantable Pulse Generator Serial Number: 1234823
Lead Channel Impedance Value: 460 Ohm
Lead Channel Pacing Threshold Amplitude: 1.25 V
Lead Channel Sensing Intrinsic Amplitude: 6.1 mV
Lead Channel Setting Pacing Amplitude: 2.5 V
Lead Channel Setting Pacing Pulse Width: 0.5 ms
MDC IDC MSMT LEADCHNL RV PACING THRESHOLD PULSEWIDTH: 0.5 ms
MDC IDC SET LEADCHNL RV SENSING SENSITIVITY: 2 mV

## 2014-08-01 NOTE — Progress Notes (Signed)
Pacemaker check in clinic. Normal device function. Thresholds, sensing, impedances consistent with previous measurements. Device programmed to maximize longevity. No high ventricular rates noted. Device programmed at appropriate safety margins. Histogram distribution appropriate for patient activity level. Device programmed to optimize intrinsic conduction. Estimated longevity 10 years. Patient enrolled in remote follow-up/TTM's with [employer]. Plan to follow every 3 months remotely and see annually in office. Patient education completed.  ROV 6 months with Dr. Rayann Heman.

## 2014-08-14 ENCOUNTER — Encounter: Payer: Self-pay | Admitting: Internal Medicine

## 2014-08-16 ENCOUNTER — Emergency Department (HOSPITAL_COMMUNITY)
Admission: EM | Admit: 2014-08-16 | Discharge: 2014-08-16 | Disposition: A | Discharge status: Home / Self Care | Payer: Medicare Other | Attending: Emergency Medicine | Admitting: Emergency Medicine

## 2014-08-16 ENCOUNTER — Encounter (HOSPITAL_COMMUNITY): Payer: Self-pay | Admitting: Emergency Medicine

## 2014-08-16 ENCOUNTER — Emergency Department (HOSPITAL_COMMUNITY): Payer: Medicare Other

## 2014-08-16 DIAGNOSIS — Z87891 Personal history of nicotine dependence: Secondary | ICD-10-CM | POA: Insufficient documentation

## 2014-08-16 DIAGNOSIS — Y9289 Other specified places as the place of occurrence of the external cause: Secondary | ICD-10-CM | POA: Diagnosis not present

## 2014-08-16 DIAGNOSIS — Z8739 Personal history of other diseases of the musculoskeletal system and connective tissue: Secondary | ICD-10-CM | POA: Diagnosis not present

## 2014-08-16 DIAGNOSIS — F329 Major depressive disorder, single episode, unspecified: Secondary | ICD-10-CM | POA: Insufficient documentation

## 2014-08-16 DIAGNOSIS — F419 Anxiety disorder, unspecified: Secondary | ICD-10-CM | POA: Diagnosis not present

## 2014-08-16 DIAGNOSIS — H9190 Unspecified hearing loss, unspecified ear: Secondary | ICD-10-CM | POA: Diagnosis not present

## 2014-08-16 DIAGNOSIS — W19XXXA Unspecified fall, initial encounter: Secondary | ICD-10-CM

## 2014-08-16 DIAGNOSIS — Z8719 Personal history of other diseases of the digestive system: Secondary | ICD-10-CM | POA: Insufficient documentation

## 2014-08-16 DIAGNOSIS — S0083XA Contusion of other part of head, initial encounter: Secondary | ICD-10-CM | POA: Diagnosis not present

## 2014-08-16 DIAGNOSIS — Z79899 Other long term (current) drug therapy: Secondary | ICD-10-CM | POA: Insufficient documentation

## 2014-08-16 DIAGNOSIS — W01198A Fall on same level from slipping, tripping and stumbling with subsequent striking against other object, initial encounter: Secondary | ICD-10-CM | POA: Diagnosis not present

## 2014-08-16 DIAGNOSIS — E780 Pure hypercholesterolemia: Secondary | ICD-10-CM | POA: Diagnosis not present

## 2014-08-16 DIAGNOSIS — Z950 Presence of cardiac pacemaker: Secondary | ICD-10-CM | POA: Diagnosis not present

## 2014-08-16 DIAGNOSIS — S0512XA Contusion of eyeball and orbital tissues, left eye, initial encounter: Secondary | ICD-10-CM | POA: Diagnosis not present

## 2014-08-16 DIAGNOSIS — Y9389 Activity, other specified: Secondary | ICD-10-CM | POA: Insufficient documentation

## 2014-08-16 DIAGNOSIS — Y998 Other external cause status: Secondary | ICD-10-CM | POA: Insufficient documentation

## 2014-08-16 DIAGNOSIS — Z7952 Long term (current) use of systemic steroids: Secondary | ICD-10-CM | POA: Insufficient documentation

## 2014-08-16 DIAGNOSIS — Z862 Personal history of diseases of the blood and blood-forming organs and certain disorders involving the immune mechanism: Secondary | ICD-10-CM | POA: Insufficient documentation

## 2014-08-16 DIAGNOSIS — S199XXA Unspecified injury of neck, initial encounter: Secondary | ICD-10-CM | POA: Diagnosis not present

## 2014-08-16 DIAGNOSIS — S0990XA Unspecified injury of head, initial encounter: Secondary | ICD-10-CM | POA: Diagnosis present

## 2014-08-16 MED ORDER — IBUPROFEN 200 MG PO TABS
400.0000 mg | ORAL_TABLET | Freq: Once | ORAL | Status: AC
Start: 2014-08-16 — End: 2014-08-16
  Administered 2014-08-16: 400 mg via ORAL
  Filled 2014-08-16: qty 2

## 2014-08-16 MED ORDER — OXYCODONE-ACETAMINOPHEN 5-325 MG PO TABS
1.0000 | ORAL_TABLET | Freq: Once | ORAL | Status: AC
  Administered 2014-08-16: 1 via ORAL
  Filled 2014-08-16: qty 1

## 2014-08-16 MED ORDER — OXYCODONE-ACETAMINOPHEN 5-325 MG PO TABS: 1.0000 | ORAL_TABLET | ORAL | Status: DC | PRN

## 2014-08-16 NOTE — ED Provider Notes (Signed)
CSN: [id number]     Arrival date & time 08/16/14  1843 History   First MD Initiated Contact with Patient 08/16/14 2042     Chief Complaint  Patient presents with  . Fall  . Facial Laceration     (Consider location/radiation/quality/duration/timing/severity/associated sxs/prior Treatment) HPI   81 year old female with facial pain after a fall. Patient tripped prior to arrival fell to the ground. She did strike her head. No loss of consciousness. Abrasions left side of her face and swelling around the left eye. No acute change in visual acuity. Mild headache. No neck or back pain. No acute numbness, tingling or loss of strength. At baseline mental status per daughter at bedside. No blood thinners.  Past Medical History  Diagnosis Date  . Hearing loss   . Allergic rhinitis   . Sick sinus syndrome   . Presence of permanent cardiac pacemaker   . History of pericarditis   . Hypercholesteremia   . GERD (gastroesophageal reflux disease)   . IBS (irritable bowel syndrome)   . Diverticulosis of colon   . DJD (degenerative joint disease)   . Lumbar back pain   . Anxiety and depression   . Anemia   . Monoclonal gammopathy   . Anxiety   . Cataract   . Depression   . Osteoporosis     osteopenia  . Hiatal hernia   . Gastric polyps   . Memory loss    Past Surgical History  Procedure Laterality Date  . Pacemaker placement      most recent generator replaced 2009 by Dr Olevia Perches  . Appendectomy    . Dilation and curettage of uterus  03/24/10   Family History  Problem Relation Age of Onset  . Heart failure Mother   . Heart attack Father   . Parkinsonism Sister   . Rectal cancer Daughter   . Colon cancer Daughter   . Pancreatic cancer Brother   . Pancreatic cancer Sister   . Brain cancer Brother   . Heart disease Brother   . Colon cancer Daughter   . Diabetes Sister   . Clotting disorder Sister   . Lung cancer Sister     twin  . Crohn's disease Sister     twin   History   Substance Use Topics  . Smoking status: Former Smoker -- 0.50 packs/day for 10 years    Types: Cigarettes    Quit date: 05/12/1987  . Smokeless tobacco: Never Used  . Alcohol Use: 0.6 oz/week    1 Glasses of wine per week     Comment: wine occasionally   OB History    No data available     Review of Systems    Allergies  Aricept and Exelon  Home Medications   Prior to Admission medications   Medication Sig Start Date End Date Taking? Authorizing Provider  FLUoxetine (PROZAC) 20 MG capsule Take 20 mg by mouth daily.  04/30/14  Yes Historical Provider, MD  LORazepam (ATIVAN) 1 MG tablet Take 1 tablet (1 mg total) by mouth 2 (two) times daily as needed for anxiety. 05/15/14  Yes Kathrynn Ducking, MD  memantine (NAMENDA) 10 MG tablet Take 1 tablet (10 mg total) by mouth 2 (two) times daily. 06/17/14  Yes Kathrynn Ducking, MD  simvastatin (ZOCOR) 20 MG tablet Take 20 mg by mouth every evening.  02/26/14  Yes Historical Provider, MD  hydrocortisone-pramoxine [facility]) 2.5-1 % rectal cream Place 1 application rectally 3 (three) times daily as needed  for itching.  07/13/12   Janett Billow D Zehr, PA-C   BP 144/89 mmHg  Pulse 74  Temp(Src) 98.3 F (36.8 C) (Oral)  Resp 20  SpO2 100% Physical Exam  Constitutional: She appears well-developed and well-nourished. No distress.  HENT:  Head: Normocephalic and atraumatic.  Left periorbital ecchymosis and tenderness. Extraocular muscle function is intact. No crepitus. Abrasion just lateral to this, no laceration.  Eyes: Conjunctivae are normal. Right eye exhibits no discharge. Left eye exhibits no discharge.  Neck: Neck supple.  Cardiovascular: Normal rate, regular rhythm and normal heart sounds.  Exam reveals no gallop and no friction rub.   No murmur heard. Pulmonary/Chest: Effort normal and breath sounds normal. No respiratory distress.  Abdominal: Soft. She exhibits no distension. There is no tenderness.  Musculoskeletal: She exhibits no  edema or tenderness.  No midline spinal tenderness. Mild tenderness over the left patella. Patient is able to actively range the left knee with little apparent discomfort. Neurovascular intact distally.  Neurological: She is alert.  Skin: Skin is warm and dry.  Psychiatric: She has a normal mood and affect. Her behavior is normal. Thought content normal.  Nursing note and vitals reviewed.   ED Course  Procedures (including critical care time) Labs Review Labs Reviewed - No data to display  Imaging Review Ct Head Wo Contrast  08/16/2014   CLINICAL DATA:  Fall with left-sided facial abrasion.  EXAM: CT HEAD WITHOUT CONTRAST  CT MAXILLOFACIAL WITHOUT CONTRAST  CT CERVICAL SPINE WITHOUT CONTRAST  TECHNIQUE: Multidetector CT imaging of the head, cervical spine, and maxillofacial structures were performed using the standard protocol without intravenous contrast. Multiplanar CT image reconstructions of the cervical spine and maxillofacial structures were also generated.  COMPARISON:  Head CT 05/24/2012  FINDINGS: CT HEAD FINDINGS  Skull and Sinuses:Facial findings discussed below. No calvarial fracture.  Orbits: See below  Brain: No evidence of acute infarction, hemorrhage, hydrocephalus, or mass lesion/mass effect. Remote lacunar infarct of the right thalamus. There is ventriculomegaly which is likely from central predominant atrophy rather than communicating hydrocephalus, stable from 2014.  CT MAXILLOFACIAL FINDINGS  Left periorbital contusion without underlying fracture. The patient is status post bilateral cataract resection. No evidence of globe injury or postseptal hematoma. No sinus effusion or opacification. The remaining left lower molar has periapical granuloma and focal osteitis. Asymmetric right TMJ osteoarthritis.  CT CERVICAL SPINE FINDINGS  Negative for acute fracture or subluxation. No prevertebral edema. No gross cervical canal hematoma. No significant osseous canal or foraminal stenosis.   IMPRESSION: 1. Left periorbital contusion without facial fracture or postseptal injury. 2. No evidence of intracranial or cervical spine injury.   Electronically Signed   By: Monte Fantasia M.D.   On: 08/16/2014 21:55   Ct Cervical Spine Wo Contrast  08/16/2014   CLINICAL DATA:  Fall with left-sided facial abrasion.  EXAM: CT HEAD WITHOUT CONTRAST  CT MAXILLOFACIAL WITHOUT CONTRAST  CT CERVICAL SPINE WITHOUT CONTRAST  TECHNIQUE: Multidetector CT imaging of the head, cervical spine, and maxillofacial structures were performed using the standard protocol without intravenous contrast. Multiplanar CT image reconstructions of the cervical spine and maxillofacial structures were also generated.  COMPARISON:  Head CT 05/24/2012  FINDINGS: CT HEAD FINDINGS  Skull and Sinuses:Facial findings discussed below. No calvarial fracture.  Orbits: See below  Brain: No evidence of acute infarction, hemorrhage, hydrocephalus, or mass lesion/mass effect. Remote lacunar infarct of the right thalamus. There is ventriculomegaly which is likely from central predominant atrophy rather than communicating hydrocephalus, stable  from 2014.  CT MAXILLOFACIAL FINDINGS  Left periorbital contusion without underlying fracture. The patient is status post bilateral cataract resection. No evidence of globe injury or postseptal hematoma. No sinus effusion or opacification. The remaining left lower molar has periapical granuloma and focal osteitis. Asymmetric right TMJ osteoarthritis.  CT CERVICAL SPINE FINDINGS  Negative for acute fracture or subluxation. No prevertebral edema. No gross cervical canal hematoma. No significant osseous canal or foraminal stenosis.  IMPRESSION: 1. Left periorbital contusion without facial fracture or postseptal injury. 2. No evidence of intracranial or cervical spine injury.   Electronically Signed   By: Monte Fantasia M.D.   On: 08/16/2014 21:55   Ct Maxillofacial Wo Cm  08/16/2014   CLINICAL DATA:  Fall with  left-sided facial abrasion.  EXAM: CT HEAD WITHOUT CONTRAST  CT MAXILLOFACIAL WITHOUT CONTRAST  CT CERVICAL SPINE WITHOUT CONTRAST  TECHNIQUE: Multidetector CT imaging of the head, cervical spine, and maxillofacial structures were performed using the standard protocol without intravenous contrast. Multiplanar CT image reconstructions of the cervical spine and maxillofacial structures were also generated.  COMPARISON:  Head CT 05/24/2012  FINDINGS: CT HEAD FINDINGS  Skull and Sinuses:Facial findings discussed below. No calvarial fracture.  Orbits: See below  Brain: No evidence of acute infarction, hemorrhage, hydrocephalus, or mass lesion/mass effect. Remote lacunar infarct of the right thalamus. There is ventriculomegaly which is likely from central predominant atrophy rather than communicating hydrocephalus, stable from 2014.  CT MAXILLOFACIAL FINDINGS  Left periorbital contusion without underlying fracture. The patient is status post bilateral cataract resection. No evidence of globe injury or postseptal hematoma. No sinus effusion or opacification. The remaining left lower molar has periapical granuloma and focal osteitis. Asymmetric right TMJ osteoarthritis.  CT CERVICAL SPINE FINDINGS  Negative for acute fracture or subluxation. No prevertebral edema. No gross cervical canal hematoma. No significant osseous canal or foraminal stenosis.  IMPRESSION: 1. Left periorbital contusion without facial fracture or postseptal injury. 2. No evidence of intracranial or cervical spine injury.   Electronically Signed   By: Monte Fantasia M.D.   On: 08/16/2014 21:55     EKG Interpretation None      MDM   Final diagnoses:  Facial contusion, initial encounter  Fall, initial encounter    81 year old female presenting after a fall. Soft tissue facial injuries. Wound care for abrasion. As needed pain medication. Imaging negative for fracture or head bleed. Nonfocal neuro exam. Return precautions were discussed with  patient and daughter.    Virgel Manifold, MD 08/22/14 [phone]

## 2014-08-16 NOTE — Discharge Instructions (Signed)
Contusion °A contusion is a deep bruise. Contusions are the result of an injury that caused bleeding under the skin. The contusion may turn blue, purple, or yellow. Minor injuries will give you a painless contusion, but more severe contusions may stay painful and swollen for a few weeks.  °CAUSES  °A contusion is usually caused by a blow, trauma, or direct force to an area of the body. °SYMPTOMS  °· Swelling and redness of the injured area. °· Bruising of the injured area. °· Tenderness and soreness of the injured area. °· Pain. °DIAGNOSIS  °The diagnosis can be made by taking a history and physical exam. An X-ray, CT scan, or MRI may be needed to determine if there were any associated injuries, such as fractures. °TREATMENT  °Specific treatment will depend on what area of the body was injured. In general, the best treatment for a contusion is resting, icing, elevating, and applying cold compresses to the injured area. Over-the-counter medicines may also be recommended for pain control. Ask your caregiver what the best treatment is for your contusion. °HOME CARE INSTRUCTIONS  °· Put ice on the injured area. °¨ Put ice in a plastic bag. °¨ Place a towel between your skin and the bag. °¨ Leave the ice on for 15-20 minutes, 3-4 times a day, or as directed by your health care provider. °· Only take over-the-counter or prescription medicines for pain, discomfort, or fever as directed by your caregiver. Your caregiver may recommend avoiding anti-inflammatory medicines (aspirin, ibuprofen, and naproxen) for 48 hours because these medicines may increase bruising. °· Rest the injured area. °· If possible, elevate the injured area to reduce swelling. °SEEK IMMEDIATE MEDICAL CARE IF:  °· You have increased bruising or swelling. °· You have pain that is getting worse. °· Your swelling or pain is not relieved with medicines. °MAKE SURE YOU:  °· Understand these instructions. °· Will watch your condition. °· Will get help right  away if you are not doing well or get worse. °Document Released: 02/04/2005 Document Revised: 05/02/2013 Document Reviewed: 03/02/2011 °ExitCare® Patient Information ©2015 [employer]. This information is not intended to replace advice given to you by your health care provider. Make sure you discuss any questions you have with your health care provider. ° °Facial or Scalp Contusion °A facial or scalp contusion is a deep bruise on the face or head. Injuries to the face and head generally cause a lot of swelling, especially around the eyes. Contusions are the result of an injury that caused bleeding under the skin. The contusion may turn blue, purple, or yellow. Minor injuries will give you a painless contusion, but more severe contusions may stay painful and swollen for a few weeks.  °CAUSES  °A facial or scalp contusion is caused by a blunt injury or trauma to the face or head area.  °SIGNS AND SYMPTOMS  °· Swelling of the injured area.   °· Discoloration of the injured area.   °· Tenderness, soreness, or pain in the injured area.   °DIAGNOSIS  °The diagnosis can be made by taking a medical history and doing a physical exam. An X-ray exam, CT scan, or MRI may be needed to determine if there are any associated injuries, such as broken bones (fractures). °TREATMENT  °Often, the best treatment for a facial or scalp contusion is applying cold compresses to the injured area. Over-the-counter medicines may also be recommended for pain control.  °HOME CARE INSTRUCTIONS  °· Only take over-the-counter or prescription medicines as directed by   your health care provider.   °· Apply ice to the injured area.   °¨ Put ice in a plastic bag.   °¨ Place a towel between your skin and the bag.   °¨ Leave the ice on for 20 minutes, 2-3 times a day.   °SEEK MEDICAL CARE IF: °· You have bite problems.   °· You have pain with chewing.   °· You are concerned about facial defects. °SEEK IMMEDIATE MEDICAL CARE IF: °· You have severe pain or  a headache that is not relieved by medicine.   °· You have unusual sleepiness, confusion, or personality changes.   °· You throw up (vomit).   °· You have a persistent nosebleed.   °· You have double vision or blurred vision.   °· You have fluid drainage from your nose or ear.   °· You have difficulty walking or using your arms or legs.   °MAKE SURE YOU:  °· Understand these instructions. °· Will watch your condition. °· Will get help right away if you are not doing well or get worse. °Document Released: 06/04/2004 Document Revised: 02/15/2013 Document Reviewed: 12/08/2012 °ExitCare® Patient Information ©2015 [employer]. This information is not intended to replace advice given to you by your health care provider. Make sure you discuss any questions you have with your health care provider. ° °

## 2014-08-16 NOTE — Progress Notes (Signed)
CSW spoke with pt at bedside. Daughter was present. Patient confirms that she presents to [facility] today due to fall. She informed CSW that a witness stated she fell due to uneven pavement. Patient states that she does not fall often and that she completes her ADL's independently.   Patient states that she lives at [facility] and has been there for the past 4 years. She says that she lives in the [facility]. CSW asked Pt did she feel as though she needed an upgraded level of care. However, the pt declined. She is determined that she will be able to maintain her care at the independent living unit in the facility.   Patient informed CSW that her daughter is her primary support. Daughter informed CSW that she lives in [place] and that she talks to the pt often on the phone.  Patient states that she does not have any questions at this time. However, she did express that her head is hurting.    Daughter/ Manuela Schwartz [phone]  Willette Brace [phone] ED CSW 08/16/2014 9:36 PM

## 2014-08-16 NOTE — ED Notes (Addendum)
Pt c/o lt sided head pain and skinned knees after tripping in the parking lot about an hour ago.  Facial laceration noted to lt side of face. No injury to hands indicating that she did not catch herself.  Pt A&O x 4.  Denies thinners.

## 2014-09-11 DIAGNOSIS — Z961 Presence of intraocular lens: Secondary | ICD-10-CM | POA: Diagnosis not present

## 2014-09-11 DIAGNOSIS — H3532 Exudative age-related macular degeneration: Secondary | ICD-10-CM | POA: Diagnosis not present

## 2014-09-20 ENCOUNTER — Other Ambulatory Visit: Payer: Self-pay | Admitting: Neurology

## 2014-09-20 DIAGNOSIS — H3532 Exudative age-related macular degeneration: Secondary | ICD-10-CM | POA: Diagnosis not present

## 2014-09-20 DIAGNOSIS — H3531 Nonexudative age-related macular degeneration: Secondary | ICD-10-CM | POA: Diagnosis not present

## 2014-09-20 MED ORDER — LORAZEPAM 1 MG PO TABS: 1.0000 mg | ORAL_TABLET | Freq: Two times a day (BID) | ORAL | Status: DC | PRN

## 2014-09-20 NOTE — Telephone Encounter (Signed)
Georgina Snell from [facility] called and wanted to follow up on a Rx. Refill request she had faxed over to [place] for the patient. The patient is going out of town and Georgina Snell was hoping to speak with someone regarding this before 1:00pm today. Please call and advise.

## 2014-09-20 NOTE — Telephone Encounter (Signed)
I called back.  Spoke with [employer] [employer]  She said they just faxed the request right before calling, so we may not have gotten it yet.  She is requesting a refill on Ativan.  Asked that the Rx be faxed to them at [phone].  Request has been entered, forwarded to provider for approval.

## 2014-09-20 NOTE — Telephone Encounter (Signed)
Rx has been signed and faxed  

## 2014-10-19 DIAGNOSIS — N390 Urinary tract infection, site not specified: Secondary | ICD-10-CM | POA: Diagnosis not present

## 2014-10-19 DIAGNOSIS — Z6826 Body mass index (BMI) 26.0-26.9, adult: Secondary | ICD-10-CM | POA: Diagnosis not present

## 2014-10-19 DIAGNOSIS — R32 Unspecified urinary incontinence: Secondary | ICD-10-CM | POA: Diagnosis not present

## 2014-10-19 DIAGNOSIS — E785 Hyperlipidemia, unspecified: Secondary | ICD-10-CM | POA: Diagnosis not present

## 2014-10-19 DIAGNOSIS — K219 Gastro-esophageal reflux disease without esophagitis: Secondary | ICD-10-CM | POA: Diagnosis not present

## 2014-10-19 DIAGNOSIS — F329 Major depressive disorder, single episode, unspecified: Secondary | ICD-10-CM | POA: Diagnosis not present

## 2014-10-31 ENCOUNTER — Telehealth: Payer: Self-pay | Admitting: Neurology

## 2014-10-31 NOTE — Telephone Encounter (Signed)
Patients daughter called and stated that she would like to speak with the nurse regarding her mothers condition and the possibility of moving up her 8/29 appt. Please call and advise.

## 2014-10-31 NOTE — Telephone Encounter (Signed)
I called [name]. She stated that the patient has had some personality changes. It seems that her memory is worse and she would like to have it re-evaluated. The patient obsesses over everything and cries all the time. Her daughter states she lets every little thing bother her. Appointment scheduled for 7/5. Patient will be out of town 6/27-7/1.

## 2014-11-13 ENCOUNTER — Encounter: Payer: Self-pay | Admitting: Neurology

## 2014-11-13 ENCOUNTER — Ambulatory Visit (INDEPENDENT_AMBULATORY_CARE_PROVIDER_SITE_OTHER): Payer: Medicare Other | Admitting: Neurology

## 2014-11-13 VITALS — BP 147/64 | HR 75 | Ht 63.0 in | Wt 150.4 lb

## 2014-11-13 DIAGNOSIS — F341 Dysthymic disorder: Secondary | ICD-10-CM

## 2014-11-13 DIAGNOSIS — H3532 Exudative age-related macular degeneration: Secondary | ICD-10-CM | POA: Diagnosis not present

## 2014-11-13 DIAGNOSIS — R413 Other amnesia: Secondary | ICD-10-CM

## 2014-11-13 NOTE — Progress Notes (Signed)
Reason for visit: Memory disorder  [name] is an 81 y.o. female  History of present illness:  Ms. Gionfriddo is an 81 year old right-handed white female with a history of a mild memory disturbance. The patient currently is residing at [facility] in an independent living situation. The patient still operates a motor vehicle, she has not had any issues with directions. She has a good safety record with her driving. The patient has had a recent increase in anxiety and depression, Abilify was added to the Prozac, and she has had a dramatic improvement in her psychiatric issues. The patient was set up to see a psychiatrist on the last visit, but this never occurred. The patient is sleeping fairly well at night. She requires some minimal assistance with her medications, appointments, and finances, but she basically does most of this herself. She does socialize at the extended care facility. She returns for an evaluation.  Past Medical History  Diagnosis Date  . Hearing loss   . Allergic rhinitis   . Sick sinus syndrome   . Presence of permanent cardiac pacemaker   . History of pericarditis   . Hypercholesteremia   . GERD (gastroesophageal reflux disease)   . IBS (irritable bowel syndrome)   . Diverticulosis of colon   . DJD (degenerative joint disease)   . Lumbar back pain   . Anxiety and depression   . Anemia   . Monoclonal gammopathy   . Anxiety   . Cataract   . Depression   . Osteoporosis     osteopenia  . Hiatal hernia   . Gastric polyps   . Memory loss     Past Surgical History  Procedure Laterality Date  . Pacemaker placement      most recent generator replaced 2009 by Dr Olevia Perches  . Appendectomy    . Dilation and curettage of uterus  03/24/10    Family History  Problem Relation Age of Onset  . Heart failure Mother   . Heart attack Father   . Parkinsonism Sister   . Rectal cancer Daughter   . Colon cancer Daughter   . Pancreatic cancer Brother   . Pancreatic  cancer Sister   . Brain cancer Brother   . Heart disease Brother   . Colon cancer Daughter   . Diabetes Sister   . Clotting disorder Sister   . Lung cancer Sister     twin  . Crohn's disease Sister     twin    Social history:  reports that she quit smoking about 27 years ago. Her smoking use included Cigarettes. She has a 5 pack-year smoking history. She has never used smokeless tobacco. She reports that she does not drink alcohol or use illicit drugs.    Allergies  Allergen Reactions  . Aricept [Donepezil Hcl]     Diarrhea  . Exelon [Rivastigmine Tartrate]     Diarrhea    Medications:  Prior to Admission medications   Medication Sig Start Date End Date Taking? Authorizing Provider  ARIPiprazole (ABILIFY) 5 MG tablet Take 5 mg by mouth daily. 10/29/14  Yes Historical Provider, MD  FLUoxetine (PROZAC) 40 MG capsule Take 40 mg by mouth daily. 10/18/14  Yes Historical Provider, MD  hydrocortisone-pramoxine [facility]) 2.5-1 % rectal cream Place 1 application rectally 3 (three) times daily as needed for itching.  07/13/12  Yes Jessica D Zehr, PA-C  LORazepam (ATIVAN) 1 MG tablet Take 1 tablet (1 mg total) by mouth 2 (two) times daily as  needed for anxiety. 09/20/14  Yes Kathrynn Ducking, MD  memantine (NAMENDA) 10 MG tablet Take 1 tablet (10 mg total) by mouth 2 (two) times daily. 06/17/14  Yes Kathrynn Ducking, MD  simvastatin (ZOCOR) 20 MG tablet Take 20 mg by mouth every evening.  02/26/14   Historical Provider, MD    ROS:  Out of a complete 14 system review of symptoms, the patient complains only of the following symptoms, and all other reviewed systems are negative.  Hearing loss Neck pain, joint swelling Incontinence of bladder Memory loss Depression, anxiety  Blood pressure 147/64, pulse 75, height 5\' 3"  (1.6 m), weight 150 lb 6.4 oz (68.221 kg).  Physical Exam  General: The patient is alert and cooperative at the time of the examination.  Skin: No significant  peripheral edema is noted.   Neurologic Exam  Mental status: The patient is alert and oriented x 3 at the time of the examination. The patient has apparent normal recent and remote memory, with an apparently normal attention span and concentration ability. Mini-Mental Status Examination done today shows a total score of 30/30. The patient is able to name 10 animals in 30 seconds.   Cranial nerves: Facial symmetry is present. Speech is normal, no aphasia or dysarthria is noted. Extraocular movements are full. Visual fields are full.  Motor: The patient has good strength in all 4 extremities.  Sensory examination: Soft touch sensation is symmetric on the face, arms, and legs.  Coordination: The patient has good finger-nose-finger and heel-to-shin bilaterally.  Gait and station: The patient has a normal gait. Tandem gait is unsteady. Romberg is negative. No drift is seen.  Reflexes: Deep tendon reflexes are symmetric.   Assessment/Plan:  1. Mild memory disturbance  2. Anxiety and depression  The patient primarily has issues with anxiety and depression, her cognitive functional has remained quite stable over a number of years. The patient will continue the low-dose Namenda, the addition of the Abilify has significantly improved her anxiety issues and depression. The patient will follow-up in about 6 months.  Jill Alexanders MD 11/13/2014 5:11 PM  [facility] [address] [place], [place] [address]  Phone [phone] Fax [phone]

## 2014-12-04 ENCOUNTER — Encounter: Payer: Self-pay | Admitting: *Deleted

## 2015-01-04 ENCOUNTER — Encounter: Payer: Self-pay | Admitting: *Deleted

## 2015-01-07 ENCOUNTER — Ambulatory Visit: Payer: Medicare Other | Admitting: Neurology

## 2015-01-29 DIAGNOSIS — H3532 Exudative age-related macular degeneration: Secondary | ICD-10-CM | POA: Diagnosis not present

## 2015-02-06 ENCOUNTER — Encounter: Payer: Self-pay | Admitting: *Deleted

## 2015-03-18 ENCOUNTER — Encounter: Payer: Medicare Other | Admitting: Internal Medicine

## 2015-03-19 DIAGNOSIS — R8290 Unspecified abnormal findings in urine: Secondary | ICD-10-CM | POA: Diagnosis not present

## 2015-03-19 DIAGNOSIS — D472 Monoclonal gammopathy: Secondary | ICD-10-CM | POA: Diagnosis not present

## 2015-03-19 DIAGNOSIS — E784 Other hyperlipidemia: Secondary | ICD-10-CM | POA: Diagnosis not present

## 2015-03-19 DIAGNOSIS — E559 Vitamin D deficiency, unspecified: Secondary | ICD-10-CM | POA: Diagnosis not present

## 2015-03-19 DIAGNOSIS — N390 Urinary tract infection, site not specified: Secondary | ICD-10-CM | POA: Diagnosis not present

## 2015-03-22 ENCOUNTER — Other Ambulatory Visit: Payer: Self-pay | Admitting: Neurology

## 2015-03-22 DIAGNOSIS — E559 Vitamin D deficiency, unspecified: Secondary | ICD-10-CM | POA: Diagnosis not present

## 2015-03-22 DIAGNOSIS — F0390 Unspecified dementia without behavioral disturbance: Secondary | ICD-10-CM | POA: Diagnosis not present

## 2015-03-22 DIAGNOSIS — Z0000 Encounter for general adult medical examination without abnormal findings: Secondary | ICD-10-CM | POA: Diagnosis not present

## 2015-03-22 DIAGNOSIS — I6350 Cerebral infarction due to unspecified occlusion or stenosis of unspecified cerebral artery: Secondary | ICD-10-CM | POA: Diagnosis not present

## 2015-03-22 DIAGNOSIS — Z1389 Encounter for screening for other disorder: Secondary | ICD-10-CM | POA: Diagnosis not present

## 2015-03-22 DIAGNOSIS — M859 Disorder of bone density and structure, unspecified: Secondary | ICD-10-CM | POA: Diagnosis not present

## 2015-03-22 MED ORDER — LORAZEPAM 1 MG PO TABS: 1.0000 mg | ORAL_TABLET | Freq: Two times a day (BID) | ORAL | Status: DC | PRN

## 2015-04-23 DIAGNOSIS — H43813 Vitreous degeneration, bilateral: Secondary | ICD-10-CM | POA: Diagnosis not present

## 2015-04-23 DIAGNOSIS — H353112 Nonexudative age-related macular degeneration, right eye, intermediate dry stage: Secondary | ICD-10-CM | POA: Diagnosis not present

## 2015-04-23 DIAGNOSIS — H353222 Exudative age-related macular degeneration, left eye, with inactive choroidal neovascularization: Secondary | ICD-10-CM | POA: Diagnosis not present

## 2015-05-21 ENCOUNTER — Ambulatory Visit: Payer: Medicare Other | Admitting: Neurology

## 2015-08-14 ENCOUNTER — Ambulatory Visit (INDEPENDENT_AMBULATORY_CARE_PROVIDER_SITE_OTHER): Payer: Medicare Other | Admitting: Neurology

## 2015-08-14 ENCOUNTER — Encounter: Payer: Self-pay | Admitting: Neurology

## 2015-08-14 VITALS — BP 112/66 | HR 68 | Ht 63.0 in | Wt 151.0 lb

## 2015-08-14 DIAGNOSIS — R413 Other amnesia: Secondary | ICD-10-CM

## 2015-08-14 MED ORDER — MEMANTINE HCL 10 MG PO TABS: 10.0000 mg | ORAL_TABLET | Freq: Two times a day (BID) | ORAL | Status: DC

## 2015-08-14 MED ORDER — ARIPIPRAZOLE 10 MG PO TABS: 10.0000 mg | ORAL_TABLET | Freq: Every day | ORAL | Status: DC

## 2015-08-14 NOTE — Progress Notes (Signed)
Reason for visit: Memory disturbance  [name] is an 82 y.o. female  History of present illness:  Tamara. [name] is an 82 year old right-handed white female with a history of a mild memory disturbance. The patient is on Namenda, she is doing relatively well with this. She has had some issues with depression and anxiety, the use of low-dose Abilify seemed to be helpful. The patient has had some issues recently with lack of interest, she is not as outgoing and she has been in the past. The patient operates a motor vehicle without difficulty, she manages her own medications and appointments. She indicates that she is sleeping well, with a good appetite. The patient returns to this office for an evaluation.   Past Medical History  Diagnosis Date  . Hearing loss   . Allergic rhinitis   . Sick sinus syndrome ([place])   . Presence of permanent cardiac pacemaker   . History of pericarditis   . Hypercholesteremia   . GERD (gastroesophageal reflux disease)   . IBS (irritable bowel syndrome)   . Diverticulosis of colon   . DJD (degenerative joint disease)   . Lumbar back pain   . Anxiety and depression   . Anemia   . Monoclonal gammopathy   . Anxiety   . Cataract   . Depression   . Osteoporosis     osteopenia  . Hiatal hernia   . Gastric polyps   . Memory loss     Past Surgical History  Procedure Laterality Date  . Pacemaker placement      most recent generator replaced 2009 by Dr Olevia Perches  . Appendectomy    . Dilation and curettage of uterus  03/24/10    Family History  Problem Relation Age of Onset  . Heart failure Mother   . Heart attack Father   . Parkinsonism Sister   . Rectal cancer Daughter   . Colon cancer Daughter   . Pancreatic cancer Brother   . Pancreatic cancer Sister   . Brain cancer Brother   . Heart disease Brother   . Colon cancer Daughter   . Diabetes Sister   . Clotting disorder Sister   . Lung cancer Sister     twin  . Crohn's disease Sister     twin    Social history:  reports that she quit smoking about 28 years ago. Her smoking use included Cigarettes. She has a 5 pack-year smoking history. She has never used smokeless tobacco. She reports that she does not drink alcohol or use illicit drugs.    Allergies  Allergen Reactions  . Aricept [Donepezil Hcl]     Diarrhea  . Exelon [Rivastigmine Tartrate]     Diarrhea    Medications:  Prior to Admission medications   Medication Sig Start Date End Date Taking? Authorizing Provider  ARIPiprazole (ABILIFY) 5 MG tablet Take 5 mg by mouth daily. 10/29/14  Yes Historical Provider, MD  FLUoxetine (PROZAC) 40 MG capsule Take 40 mg by mouth daily. 10/18/14  Yes Historical Provider, MD  hydrocortisone-pramoxine [facility]) 2.5-1 % rectal cream Place 1 application rectally 3 (three) times daily as needed for itching.  07/13/12  Yes Jessica D Zehr, PA-C  LORazepam (ATIVAN) 1 MG tablet Take 1 tablet (1 mg total) by mouth 2 (two) times daily as needed for anxiety. 03/22/15  Yes Kathrynn Ducking, MD  memantine (NAMENDA) 10 MG tablet Take 1 tablet (10 mg total) by mouth 2 (two) times daily. 06/17/14  Yes Juanda Crumble  Loreta Ave, MD    ROS:  Out of a complete 14 system review of symptoms, the patient complains only of the following symptoms, and all other reviewed systems are negative.  Decreased activity Hearing loss Restless legs Incontinence of the bladder Joint pain, joint swelling, walking difficulty Memory loss Confusion, decreased concentration, depression  Blood pressure 112/66, pulse 68, height 5\' 3"  (1.6 m), weight 151 lb (68.493 kg).  Physical Exam  General: The patient is alert and cooperative at the time of the examination.  Skin: No significant peripheral edema is noted.   Neurologic Exam  Mental status: The patient is alert and oriented x 3 at the time of the examination. The patient has apparent normal recent and remote memory, with an apparently normal attention span and  concentration ability. The Mini-Mental Status Examination done today shows a total score 28/30. The patient is able to name 5 animals in 30 seconds.   Cranial nerves: Facial symmetry is present. Speech is normal, no aphasia or dysarthria is noted. Extraocular movements are full. Visual fields are full.  Motor: The patient has good strength in all 4 extremities.  Sensory examination: Soft touch sensation is symmetric on the face, arms, and legs.  Coordination: The patient has good finger-nose-finger and heel-to-shin bilaterally.  Gait and station: The patient has a normal gait. Tandem gait is normal. Romberg is negative. No drift is seen.  Reflexes: Deep tendon reflexes are symmetric.   Assessment/Plan:  1. Memory disturbance  2. Depression, anxiety  The patient has recently become a bit more withdrawn, she believes that she is getting more depression. We will go up on the Abilify taking 10 mg at night, she remains on Prozac 40 mg daily. A prescription for the Namenda 10 mg was called in. The patient will follow-up in 6 months, sooner if needed.  Jill Alexanders MD 08/14/2015 6:10 PM  [facility] [address] [place], [place] [address]  Phone [phone] Fax [phone]

## 2015-08-20 ENCOUNTER — Telehealth: Payer: Self-pay | Admitting: Neurology

## 2015-08-20 MED ORDER — MEMANTINE HCL 10 MG PO TABS: 10.0000 mg | ORAL_TABLET | Freq: Two times a day (BID) | ORAL | Status: DC

## 2015-08-20 MED ORDER — ARIPIPRAZOLE 10 MG PO TABS: 10.0000 mg | ORAL_TABLET | Freq: Every day | ORAL | Status: DC

## 2015-08-20 NOTE — Telephone Encounter (Signed)
Printed rx abilify and namenda. Awaiting Dr Jannifer Franklin sig. Will fax once signed

## 2015-08-20 NOTE — Telephone Encounter (Signed)
Faxed printed rx's to pt pharmacy. FaxDA:[phone]. Received confirmation.

## 2015-08-20 NOTE — Telephone Encounter (Signed)
[name], [place] [phone] called regarding Rx's that Dr. Jannifer Franklin may have prescribed at patient's last visit 08/14/15, patient couldn't remember the name(s) of medications. [name] advised that we fax Rx's to fax# [phone], they don't receive electronic (computer sent) Rx's.

## 2015-09-03 DIAGNOSIS — H353222 Exudative age-related macular degeneration, left eye, with inactive choroidal neovascularization: Secondary | ICD-10-CM | POA: Diagnosis not present

## 2015-09-03 DIAGNOSIS — H353112 Nonexudative age-related macular degeneration, right eye, intermediate dry stage: Secondary | ICD-10-CM | POA: Diagnosis not present

## 2015-09-18 DIAGNOSIS — Z961 Presence of intraocular lens: Secondary | ICD-10-CM | POA: Diagnosis not present

## 2015-09-18 DIAGNOSIS — H52203 Unspecified astigmatism, bilateral: Secondary | ICD-10-CM | POA: Diagnosis not present

## 2015-11-06 DIAGNOSIS — R32 Unspecified urinary incontinence: Secondary | ICD-10-CM | POA: Diagnosis not present

## 2015-11-06 DIAGNOSIS — N390 Urinary tract infection, site not specified: Secondary | ICD-10-CM | POA: Diagnosis not present

## 2015-11-28 ENCOUNTER — Other Ambulatory Visit (HOSPITAL_COMMUNITY): Payer: Self-pay | Admitting: Internal Medicine

## 2015-11-28 ENCOUNTER — Ambulatory Visit (HOSPITAL_COMMUNITY)
Admission: RE | Admit: 2015-11-28 | Discharge: 2015-11-28 | Disposition: A | Discharge status: Home / Self Care | Payer: Medicare Other | Source: Ambulatory Visit | Attending: Vascular Surgery | Admitting: Vascular Surgery

## 2015-11-28 DIAGNOSIS — F419 Anxiety disorder, unspecified: Secondary | ICD-10-CM | POA: Insufficient documentation

## 2015-11-28 DIAGNOSIS — R0989 Other specified symptoms and signs involving the circulatory and respiratory systems: Secondary | ICD-10-CM | POA: Insufficient documentation

## 2015-11-28 DIAGNOSIS — K219 Gastro-esophageal reflux disease without esophagitis: Secondary | ICD-10-CM | POA: Diagnosis not present

## 2015-11-28 DIAGNOSIS — F329 Major depressive disorder, single episode, unspecified: Secondary | ICD-10-CM | POA: Insufficient documentation

## 2015-11-28 DIAGNOSIS — E7800 Pure hypercholesterolemia, unspecified: Secondary | ICD-10-CM | POA: Diagnosis not present

## 2016-02-11 ENCOUNTER — Telehealth: Payer: Self-pay | Admitting: Neurology

## 2016-02-11 MED ORDER — ARIPIPRAZOLE 10 MG PO TABS: 10.0000 mg | ORAL_TABLET | Freq: Every day | ORAL | 0 refills | Status: DC

## 2016-02-11 NOTE — Telephone Encounter (Signed)
Rx printed, signed, faxed to pharmacy. 

## 2016-02-11 NOTE — Telephone Encounter (Signed)
Pt has follow-up scheduled w/ Megan NP on 02/13/16. Since pt is almost out of her medication and has recently changed pharmacies, will send in 30 day refill in case of any med changes at upcoming appt. Rx printed, awaiting signature.

## 2016-02-11 NOTE — Telephone Encounter (Addendum)
[name], [place]  Called to request refill of ARIPiprazole (ABILIFY) 10 MG tablet, states there has been a pharmacy change as of Sept 11th from [facility]. [name] will be there until 1:30 today and closed on Wednesday's, back on Thursday until 1:30pm. [name] states patient has 2 pills left of this medication, states if helpful can fax to [phone].

## 2016-02-13 ENCOUNTER — Ambulatory Visit (INDEPENDENT_AMBULATORY_CARE_PROVIDER_SITE_OTHER): Payer: Medicare Other | Admitting: Adult Health

## 2016-02-13 ENCOUNTER — Encounter: Payer: Self-pay | Admitting: Adult Health

## 2016-02-13 VITALS — BP 123/65 | HR 78 | Ht 63.0 in | Wt 162.0 lb

## 2016-02-13 DIAGNOSIS — R413 Other amnesia: Secondary | ICD-10-CM

## 2016-02-13 DIAGNOSIS — F329 Major depressive disorder, single episode, unspecified: Secondary | ICD-10-CM

## 2016-02-13 DIAGNOSIS — F32A Depression, unspecified: Secondary | ICD-10-CM

## 2016-02-13 NOTE — Progress Notes (Signed)
I have read the note, and I agree with the clinical assessment and plan.  [name]   

## 2016-02-13 NOTE — Progress Notes (Signed)
PATIENT: [name] DOB: [birth year]  REASON FOR VISIT: follow up- memory HISTORY FROM: patient  HISTORY OF PRESENT ILLNESS: [name] is an 82 year old female with a history of mild memory disturbance. She returns today for follow-up. She is currently on Namenda and tolerating it well. She takes Abilify and Prozac for anxiety and depression. Her Abilify was recently increased. She does not see a psychiatrist. She reports that her memory has remained stable. She continues to live at [facility] in an independent living facility. She is able to complete all ADLs independently. She operates a Teacher, music without difficulty. She handles her own finances. She states that she is able to prepare meals but doesn't want to. Denies any trouble sleeping. Her daughter reports that she does not engage in any activities. States that she has a chronic history of depression. In the past she has been evaluated by psychiatry but has not followed up regularly. She denies any new neurological symptoms. Returns today for an evaluation.  HISTORY [name] is an 82 year old right-handed white female with a history of a mild memory disturbance. The patient currently is residing at [facility] in an independent living situation. The patient still operates a motor vehicle, she has not had any issues with directions. She has a good safety record with her driving. The patient has had a recent increase in anxiety and depression, Abilify was added to the Prozac, and she has had a dramatic improvement in her psychiatric issues. The patient was set up to see a psychiatrist on the last visit, but this never occurred. The patient is sleeping fairly well at night. She requires some minimal assistance with her medications, appointments, and finances, but she basically does most of this herself. She does socialize at the extended care facility. She returns for an evaluation.  REVIEW OF SYSTEMS: Out of a complete 14 system review  of symptoms, the patient complains only of the following symptoms, and all other reviewed systems are negative.  Decreased concentration, incontinence of bladder, activity change, hearing loss, itching  ALLERGIES: Allergies  Allergen Reactions  . Aricept [Donepezil Hcl]     Diarrhea  . Exelon [Rivastigmine Tartrate]     Diarrhea    HOME MEDICATIONS: Outpatient Medications Prior to Visit  Medication Sig Dispense Refill  . ARIPiprazole (ABILIFY) 10 MG tablet Take 1 tablet (10 mg total) by mouth daily. 30 tablet 0  . FLUoxetine (PROZAC) 40 MG capsule Take 40 mg by mouth daily.    . hydrocortisone-pramoxine (ANALPRAM-HC) 2.5-1 % rectal cream Place 1 application rectally 3 (three) times daily as needed for itching.     . memantine (NAMENDA) 10 MG tablet Take 1 tablet (10 mg total) by mouth 2 (two) times daily. 60 tablet 6   No facility-administered medications prior to visit.     PAST MEDICAL HISTORY: Past Medical History:  Diagnosis Date  . Allergic rhinitis   . Anemia   . Anxiety   . Anxiety and depression   . Cataract   . Depression   . Diverticulosis of colon   . DJD (degenerative joint disease)   . Gastric polyps   . GERD (gastroesophageal reflux disease)   . Hearing loss   . Hiatal hernia   . History of pericarditis   . Hypercholesteremia   . IBS (irritable bowel syndrome)   . Lumbar back pain   . Memory loss   . Monoclonal gammopathy   . Osteoporosis    osteopenia  . Presence of permanent cardiac  pacemaker   . Sick sinus syndrome ([place])     PAST SURGICAL HISTORY: Past Surgical History:  Procedure Laterality Date  . APPENDECTOMY    . DILATION AND CURETTAGE OF UTERUS  03/24/10  . PACEMAKER PLACEMENT     most recent generator replaced 2009 by Dr Olevia Perches    FAMILY HISTORY: Family History  Problem Relation Age of Onset  . Heart failure Mother   . Heart attack Father   . Parkinsonism Sister   . Rectal cancer Daughter   . Colon cancer Daughter   .  Pancreatic cancer Brother   . Pancreatic cancer Sister   . Brain cancer Brother   . Heart disease Brother   . Colon cancer Daughter   . Diabetes Sister   . Clotting disorder Sister   . Lung cancer Sister     twin  . Crohn's disease Sister     twin    SOCIAL HISTORY: Social History   Social History  . Marital status: Widowed    Spouse name: N/A  . Number of children: 3  . Years of education: 12   Occupational History  .  Retired    retired   Social History Main Topics  . Smoking status: Former Smoker    Packs/day: 0.50    Years: 10.00    Types: Cigarettes    Quit date: 05/12/1987  . Smokeless tobacco: Never Used  . Alcohol use No     Comment: wine occasionally  . Drug use: No  . Sexual activity: Not on file   Other Topics Concern  . Not on file   Social History Narrative   Pt has 12 siblings.   Alcohol use- no   Drug use- no   Daily caffeine use-1-2 cup      Patient is a widow.   Retired   [employer] school education.      Right handed               PHYSICAL EXAM  Vitals:   02/13/16 1057  BP: 123/65  Pulse: 78  Weight: 162 lb (73.5 kg)  Height: 5\' 3"  (1.6 m)   Body mass index is 28.7 kg/m.  MMSE - Mini Mental State Exam 02/13/2016 08/14/2015 11/13/2014  Orientation to time 5 4 5   Orientation to Place 5 5 5   Registration 3 3 3   Attention/ Calculation 5 5 5   Recall 2 2 3   Language- name 2 objects 2 2 2   Language- repeat 1 1 1   Language- follow 3 step command 3 3 3   Language- read & follow direction 1 1 1   Write a sentence 1 1 1   Copy design 1 1 1   Total score 29 28 30      Generalized: Well developed, in no acute distress   Neurological examination  Mentation: Alert oriented to time, place, history taking. Follows all commands speech and language fluent Cranial nerve II-XII: Pupils were equal round reactive to light. Extraocular movements were full, visual field were full on confrontational test. Facial sensation and strength were normal. Uvula  tongue midline. Head turning and shoulder shrug  were normal and symmetric. Motor: The motor testing reveals 5 over 5 strength of all 4 extremities. Good symmetric motor tone is noted throughout.  Sensory: Sensory testing is intact to soft touch on all 4 extremities. No evidence of extinction is noted.  Coordination: Cerebellar testing reveals good finger-nose-finger and heel-to-shin bilaterally.  Gait and station: Gait is normal. Tandem gait is normal. Romberg is negative.  No drift is seen.  Reflexes: Deep tendon reflexes are symmetric and normal bilaterally.   DIAGNOSTIC DATA (LABS, IMAGING, TESTING) - I reviewed patient records, labs, notes, testing and imaging myself where available.    ASSESSMENT AND PLAN 82 y.o. year old female  has a past medical history of Allergic rhinitis; Anemia; Anxiety; Anxiety and depression; Cataract; Depression; Diverticulosis of colon; DJD (degenerative joint disease); Gastric polyps; GERD (gastroesophageal reflux disease); Hearing loss; Hiatal hernia; History of pericarditis; Hypercholesteremia; IBS (irritable bowel syndrome); Lumbar back pain; Memory loss; Monoclonal gammopathy; Osteoporosis; Presence of permanent cardiac pacemaker; and Sick sinus syndrome ([place]). here with:  1. Memory 2. Depression  The patient's memory has remained stable. We will continue to monitor. She will remain on Abilify for depression and anxiety. I had a long discussion with the patient and her daughter. Advised that she should be evaluated by psychiatry in order to get her depressive symptoms under better control. They voiced understanding. I will put her referral in to psychiatry. Patient's memory is stable she will continue on Namenda. I encouraged the patient to engage in activities at [facility]. Advised that if her symptoms worsen or she develops any new symptoms she should let us know. Follow-up in 4 months or sooner if needed.  I spent 25 minutes with the patient. 50% of this  time was spent discussing her symptoms and treatment.     Ward Givens, MSN, NP-C 02/13/2016, 10:18 AM [facility] [address], [place], [place] [address] [phone]

## 2016-02-13 NOTE — Patient Instructions (Signed)
Continue namenda Referral to Psychiatry for depression Find two activities at [facility] that interest you and participate in those weekly If your symptoms worsen or you develop new symptoms please let us know.

## 2016-03-31 ENCOUNTER — Telehealth: Payer: Self-pay | Admitting: Pulmonary Disease

## 2016-03-31 NOTE — Telephone Encounter (Signed)
Pt was last seen by SN on 10/23/13.  Pt has another provider listed as her PCP.  I have called and lmomtcb x 1.

## 2016-04-01 NOTE — Telephone Encounter (Signed)
Called and spoke to [place] and informed him we are no longer the pt's treating PCP, now is Dr. Ardeth Perfect per pt's chart. Randall Hiss verbalized understanding and denied any further questions or concerns at this time.

## 2016-06-16 ENCOUNTER — Ambulatory Visit: Payer: Medicare Other | Admitting: Adult Health

## 2016-07-15 ENCOUNTER — Encounter: Payer: Self-pay | Admitting: Adult Health

## 2016-07-15 ENCOUNTER — Ambulatory Visit (INDEPENDENT_AMBULATORY_CARE_PROVIDER_SITE_OTHER): Payer: Medicare Other | Admitting: Adult Health

## 2016-07-15 VITALS — BP 129/73 | HR 78 | Wt 157.6 lb

## 2016-07-15 DIAGNOSIS — R413 Other amnesia: Secondary | ICD-10-CM

## 2016-07-15 NOTE — Patient Instructions (Addendum)
Contnue Namenda Try to participate in a daily activity at [facility] If your symptoms worsen or you develop new symptoms please let us know.

## 2016-07-15 NOTE — Progress Notes (Signed)
PATIENT: [name] DOB: 12/04/1928  REASON FOR VISIT: follow up- memory disturbance HISTORY FROM: patient and daughter  HISTORY OF PRESENT ILLNESS: Today 07/15/2016: [name] is a 83 year old female with a history of mild memory disturbance. She returns today for follow-up. She is currently taking Namenda and tolerating it well. She did follow-up with Dr. Casimiro Needle who thought she had dementia. He did take her off of Abilify but left her on Prozac. The patient continues to reside at [facility]. She does have a caregiver that comes in 3 times a week 2 hours a day. She does require some assistance with ADLs. She does not operate a motor vehicle. She no longer prepares meals. States that she is sleeping well. Good appetite. Her daughter reports that occasionally she will move her feet as if she is dancing but  only while standing and having a conversation. She states this started approximately 4-6 weeks ago. The patient states that she does have restless legs and for this reason she moves her legs when she is standing. She returns today for an evaluation.  HISTORY 02/13/16: [name] is an 83 year old female with a history of mild memory disturbance. She returns today for follow-up. She is currently on Namenda and tolerating it well. She takes Abilify and Prozac for anxiety and depression. Her Abilify was recently increased. She does not see a psychiatrist. She reports that her memory has remained stable. She continues to live at [facility] in an independent living facility. She is able to complete all ADLs independently. She operates a Teacher, music without difficulty. She handles her own finances. She states that she is able to prepare meals but doesn't want to. Denies any trouble sleeping. Her daughter reports that she does not engage in any activities. States that she has a chronic history of depression. In the past she has been evaluated by psychiatry but has not followed up regularly. She  denies any new neurological symptoms. Returns today for an evaluation.  HISTORY [name] is an 83 year old right-handed white female with a history of a mild memory disturbance. The patient currently is residing at [facility] in an independent living situation. The patient still operates a motor vehicle, she has not had any issues with directions. She has a good safety record with her driving. The patient has had a recent increase in anxiety and depression, Abilify was added to the Prozac, and she has had a dramatic improvement in her psychiatric issues. The patient was set up to see a psychiatrist on the last visit, but this never occurred. The patient is sleeping fairly well at night. She requires some minimal assistance with her medications, appointments, and finances, but she basically does most of this herself. She does socialize at the extended care facility. She returns for an evaluation.  REVIEW OF SYSTEMS: Out of a complete 14 system review of symptoms, the patient complains only of the following symptoms, and all other reviewed systems are negative.  Incontinence of bladder, joint pain, memory loss, eye itching, hearing loss  ALLERGIES: Allergies  Allergen Reactions  . Aricept [Donepezil Hcl]     Diarrhea  . Exelon [Rivastigmine Tartrate]     Diarrhea    HOME MEDICATIONS: Outpatient Medications Prior to Visit  Medication Sig Dispense Refill  . FLUoxetine (PROZAC) 40 MG capsule Take 40 mg by mouth daily.    . memantine (NAMENDA) 10 MG tablet Take 1 tablet (10 mg total) by mouth 2 (two) times daily. 60 tablet 6  . hydrocortisone-pramoxine (  ANALPRAM-HC) 2.5-1 % rectal cream Place 1 application rectally 3 (three) times daily as needed for itching.     . ARIPiprazole (ABILIFY) 10 MG tablet Take 1 tablet (10 mg total) by mouth daily. 30 tablet 0   No facility-administered medications prior to visit.     PAST MEDICAL HISTORY: Past Medical History:  Diagnosis Date  . Allergic  rhinitis   . Anemia   . Anxiety   . Anxiety and depression   . Cataract   . Depression   . Diverticulosis of colon   . DJD (degenerative joint disease)   . Gastric polyps   . GERD (gastroesophageal reflux disease)   . Hearing loss   . Hiatal hernia   . History of pericarditis   . Hypercholesteremia   . IBS (irritable bowel syndrome)   . Lumbar back pain   . Memory loss   . Monoclonal gammopathy   . Osteoporosis    osteopenia  . Presence of permanent cardiac pacemaker   . Sick sinus syndrome ([place])     PAST SURGICAL HISTORY: Past Surgical History:  Procedure Laterality Date  . APPENDECTOMY    . DILATION AND CURETTAGE OF UTERUS  03/24/10  . PACEMAKER PLACEMENT     most recent generator replaced 2009 by Dr Olevia Perches    FAMILY HISTORY: Family History  Problem Relation Age of Onset  . Heart failure Mother   . Heart attack Father   . Rectal cancer Daughter   . Colon cancer Daughter   . Parkinsonism Sister   . Pancreatic cancer Brother   . Pancreatic cancer Sister   . Brain cancer Brother   . Heart disease Brother   . Colon cancer Daughter   . Diabetes Sister   . Clotting disorder Sister   . Lung cancer Sister     twin  . Crohn's disease Sister     twin    SOCIAL HISTORY: Social History   Social History  . Marital status: Widowed    Spouse name: N/A  . Number of children: 3  . Years of education: 12   Occupational History  .  Retired    retired   Social History Main Topics  . Smoking status: Former Smoker    Packs/day: 0.50    Years: 10.00    Types: Cigarettes    Quit date: 05/12/1987  . Smokeless tobacco: Never Used  . Alcohol use No     Comment: wine occasionally  . Drug use: No  . Sexual activity: Not on file   Other Topics Concern  . Not on file   Social History Narrative   Pt has 12 siblings.   Alcohol use- no   Drug use- no   Daily caffeine use-1-2 cup      Patient is a widow.   Retired   [employer] school education.      Right handed                PHYSICAL EXAM  Vitals:   07/15/16 1125  BP: 129/73  Pulse: 78  Weight: 157 lb 9.6 oz (71.5 kg)   Body mass index is 27.92 kg/m.   MMSE - Mini Mental State Exam 07/15/2016 02/13/2016 08/14/2015  Orientation to time _0 Orientation to Place _1 Registration _2 Attention/ Calculation _3 Recall _4 Language- name 2 objects _5 Language- repeat _6 Language- follow 3 step command  _0 Language- read & follow direction _1 Write a sentence _2 Copy design _3 Total score _4 Generalized: Well developed, in no acute distress   Neurological examination  Mentation: Alert oriented to time, place, history taking. Follows all commands speech and language fluent Cranial nerve II-XII: Pupils were equal round reactive to light. Extraocular movements were full, visual field were full on confrontational test. Facial sensation and strength were normal. Uvula tongue midline. Head turning and shoulder shrug  were normal and symmetric. Motor: The motor testing reveals 5 over 5 strength of all 4 extremities. Good symmetric motor tone is noted throughout.  Sensory: Sensory testing is intact to soft touch on all 4 extremities. No evidence of extinction is noted.  Coordination: Cerebellar testing reveals good finger-nose-finger and heel-to-shin bilaterally.  Gait and station: Gait is normal. Tandem gait is Unsteady. Romberg is negative. No drift is seen.  Reflexes: Deep tendon reflexes are symmetric and normal bilaterally.   DIAGNOSTIC DATA (LABS, IMAGING, TESTING) - I reviewed patient records, labs, notes, testing and imaging myself where available.  Lab Results  Component Value Date   WBC 6.6 10/23/2013   HGB 13.6 10/23/2013   HCT 41.0 10/23/2013   MCV 90.6 10/23/2013   PLT 213.0 10/23/2013      Component Value Date/Time   NA 140 10/23/2013 0959   K 4.2 10/23/2013 0959   CL 105 10/23/2013 0959   CO2 28 10/23/2013 0959   GLUCOSE  117 (H) 10/23/2013 0959   BUN 16 10/23/2013 0959   CREATININE 0.9 10/23/2013 0959   CALCIUM 9.2 10/23/2013 0959   PROT 7.1 10/23/2013 0959   ALBUMIN 4.0 10/23/2013 0959   AST 21 10/23/2013 0959   ALT 15 10/23/2013 0959   ALKPHOS 71 10/23/2013 0959   BILITOT 0.3 10/23/2013 0959   GFRNONAA >60 06/13/2009 0431   GFRAA  06/13/2009 0431    >60        The eGFR has been calculated using the MDRD equation. This calculation has not been validated in all clinical situations. eGFR's persistently <60 mL/min signify possible Chronic Kidney Disease.   Lab Results  Component Value Date   CHOL 295 (H) 10/23/2013   HDL 69.00 10/23/2013   LDLCALC 204 (H) 10/23/2013   LDLDIRECT 143.7 04/14/2011   TRIG 109.0 10/23/2013   CHOLHDL 4 10/23/2013   Lab Results  Component Value Date   HGBA1C (H) 06/10/2009    6.2 (NOTE) The ADA recommends the following therapeutic goal for glycemic control related to Hgb A1c measurement: Goal of therapy: <6.5 Hgb A1c  Reference: American Diabetes Association: Clinical Practice Recommendations 2010, Diabetes Care, 2010, 33: (Suppl  1).   Lab Results  Component Value Date   VITAMINB12 420 08/21/2010   Lab Results  Component Value Date   TSH 0.68 10/23/2013      ASSESSMENT AND PLAN 83 y.o. year old female  has a past medical history of Allergic rhinitis; Anemia; Anxiety; Anxiety and depression; Cataract; Depression; Diverticulosis of colon; DJD (degenerative joint disease); Gastric polyps; GERD (gastroesophageal reflux disease); Hearing loss; Hiatal hernia; History of pericarditis; Hypercholesteremia; IBS (irritable bowel syndrome); Lumbar back pain; Memory loss; Monoclonal gammopathy; Osteoporosis; Presence of permanent cardiac pacemaker; and Sick sinus syndrome ([place]). here with:  1. Memory disturbance  The patient's memory score has remained stable. She will continue on Namenda. She has tried Aricept in the past but this caused significant  diarrhea.  Patient is encouraged to participate in activities at [facility]. I advised the patient and her daughter that if her symptoms worsen or she develops new symptoms that she'll let us know. She will follow-up in 6 months or sooner if needed.  I spent 15 minutes with the patient 50% of this time was spent reviewing the patient's memory score and diagnosis.     Ward Givens, MSN, NP-C 07/15/2016, 12:00 PM [facility] [address], [place], [place] [address] [phone]

## 2016-07-15 NOTE — Progress Notes (Signed)
I have read the note, and I agree with the clinical assessment and plan.  [name],[name]   

## 2016-11-03 ENCOUNTER — Telehealth: Payer: Self-pay | Admitting: Neurology

## 2016-11-03 NOTE — Telephone Encounter (Signed)
Charlotte from [place]  Is wanting to discuss pt taking memantine (NAMENDA) 10 MG tablet she said pt is having a hard time remembering to take the pill at night and wanting to discuss a once a day memantine, please call

## 2016-11-03 NOTE — Telephone Encounter (Signed)
LMVM for [facility], that returned call.

## 2016-11-05 NOTE — Telephone Encounter (Signed)
[name] with [employer] called office returning RN's call.  Please call

## 2016-11-05 NOTE — Telephone Encounter (Signed)
Pharmacy closes at 1:30pm. [name]

## 2016-11-05 NOTE — Telephone Encounter (Signed)
Spoke with [name] at [place], she will call me back as pt arrived and she needed to address their needs.

## 2016-11-09 MED ORDER — MEMANTINE HCL ER 28 MG PO CP24: 28.0000 mg | ORAL_CAPSULE | Freq: Every day | ORAL | 11 refills | Status: DC

## 2016-11-09 MED ORDER — MEMANTINE HCL 10 MG PO TABS: 10.0000 mg | ORAL_TABLET | Freq: Two times a day (BID) | ORAL | 11 refills | Status: DC

## 2016-11-09 NOTE — Addendum Note (Signed)
Addended by: Brandon Melnick on: 11/09/2016 03:31 PM   Modules accepted: Orders

## 2016-11-09 NOTE — Telephone Encounter (Signed)
Change to Namenda extended release 28 mg. Prescriptions sent to [facility]

## 2016-11-09 NOTE — Telephone Encounter (Signed)
Spoke to [place] at [facility].  Pt is not taking the memantine 10mg  po bid as ordered.  Forgets to take the evening dose.  Last month, had about 1/2 bottle left per [facility] in [employer].  Asking to change for compliance to once daily tablet. (memantine ER 28mg  tab daily).  Ok?

## 2016-11-09 NOTE — Telephone Encounter (Signed)
Charlotte/[place] [phone] returned Rn's call. She will be available until 1:30 today

## 2016-11-09 NOTE — Telephone Encounter (Signed)
Received call back about once daily memantine.  Cost is $ 45.00 / mo and pts daughters want to go back to memantine 10mg  po bid.

## 2016-11-09 NOTE — Addendum Note (Signed)
Addended by: Trudie Buckler on: 11/09/2016 11:57 AM   Modules accepted: Orders

## 2017-01-15 ENCOUNTER — Encounter: Payer: Self-pay | Admitting: Neurology

## 2017-01-15 ENCOUNTER — Ambulatory Visit (INDEPENDENT_AMBULATORY_CARE_PROVIDER_SITE_OTHER): Payer: Medicare Other | Admitting: Neurology

## 2017-01-15 VITALS — BP 144/84 | HR 72 | Ht 63.0 in | Wt 162.5 lb

## 2017-01-15 DIAGNOSIS — F0280 Dementia in other diseases classified elsewhere without behavioral disturbance: Secondary | ICD-10-CM

## 2017-01-15 DIAGNOSIS — G309 Alzheimer's disease, unspecified: Secondary | ICD-10-CM

## 2017-01-15 DIAGNOSIS — G301 Alzheimer's disease with late onset: Secondary | ICD-10-CM | POA: Diagnosis not present

## 2017-01-15 DIAGNOSIS — R413 Other amnesia: Secondary | ICD-10-CM

## 2017-01-15 HISTORY — DX: Dementia in other diseases classified elsewhere, unspecified severity, without behavioral disturbance, psychotic disturbance, mood disturbance, and anxiety: F02.80

## 2017-01-15 MED ORDER — FLUOXETINE HCL 40 MG PO CAPS: 40.0000 mg | ORAL_CAPSULE | Freq: Every day | ORAL | 11 refills | Status: DC

## 2017-01-15 NOTE — Progress Notes (Signed)
Reason for visit: Memory disorder  [name] is an 83 y.o. female  History of present illness:  [name] is an 83 year old right-handed white female with a history of a progressive memory disorder and a history of depression. The patient has been on Namenda, she is tolerating this well, but she is commonly forgetting to take her evening dose of medication. The patient no longer operates a motor vehicle. The patient is in independent living, she has a caretaker coming in 3 times a week to help her. She does not socialize much, she tends to stay in her apartment. She indicates that her depression is well controlled, she is sleeping well at night, she is followed through psychiatry with Dr. Casimiro Needle. The patient returns to this office for an evaluation. Previously, she did not tolerate Aricept secondary to diarrhea.  Past Medical History:  Diagnosis Date  . Allergic rhinitis   . Anemia   . Anxiety   . Anxiety and depression   . Cataract   . Depression   . Diverticulosis of colon   . DJD (degenerative joint disease)   . Gastric polyps   . GERD (gastroesophageal reflux disease)   . Hearing loss   . Hiatal hernia   . History of pericarditis   . Hypercholesteremia   . IBS (irritable bowel syndrome)   . Lumbar back pain   . Memory loss   . Monoclonal gammopathy   . Osteoporosis    osteopenia  . Presence of permanent cardiac pacemaker   . Sick sinus syndrome [facility])     Past Surgical History:  Procedure Laterality Date  . APPENDECTOMY    . DILATION AND CURETTAGE OF UTERUS  03/24/10  . PACEMAKER PLACEMENT     most recent generator replaced 2009 by Dr Olevia Perches    Family History  Problem Relation Age of Onset  . Heart failure Mother   . Heart attack Father   . Rectal cancer Daughter   . Colon cancer Daughter   . Parkinsonism Sister   . Pancreatic cancer Brother   . Pancreatic cancer Sister   . Brain cancer Brother   . Heart disease Brother   . Colon cancer Daughter    . Diabetes Sister   . Clotting disorder Sister   . Lung cancer Sister        twin  . Crohn's disease Sister        twin    Social history:  reports that she quit smoking about 29 years ago. Her smoking use included Cigarettes. She has a 5.00 pack-year smoking history. She has never used smokeless tobacco. She reports that she does not drink alcohol or use drugs.    Allergies  Allergen Reactions  . Aricept [Donepezil Hcl]     Diarrhea  . Exelon [Rivastigmine Tartrate]     Diarrhea    Medications:  Prior to Admission medications   Medication Sig Start Date End Date Taking? Authorizing Provider  FLUoxetine (PROZAC) 40 MG capsule Take 40 mg by mouth daily. 10/18/14  Yes [provider]  memantine (NAMENDA) 10 MG tablet Take 1 tablet (10 mg total) by mouth 2 (two) times daily. 11/09/16  Yes Ward Givens, NP    ROS:  Out of a complete 14 system review of symptoms, the patient complains only of the following symptoms, and all other reviewed systems are negative.  Hearing loss Eye itching Incontinence of the bladder Memory loss Decreased concentration  Blood pressure (!) 144/84, pulse 72, height  5\' 3"  (1.6 m), weight 162 lb 8 oz (73.7 kg).  Physical Exam  General: The patient is alert and cooperative at the time of the examination.  Skin: No significant peripheral edema is noted.   Neurologic Exam  Mental status: The patient is alert and oriented x 2 at the time of the examination (not oriented to date). The Mini-Mental Status Examination done today shows a total score 26/30..   Cranial nerves: Facial symmetry is present. Speech is normal, no aphasia or dysarthria is noted. Extraocular movements are full. Visual fields are full. The patient is hard of hearing.  Motor: The patient has good strength in all 4 extremities.  Sensory examination: Soft touch sensation is symmetric on the face, arms, and legs.  Coordination: The patient has good finger-nose-finger  and heel-to-shin bilaterally.  Gait and station: The patient has a slightly wide-based gait. Tandem gait was not tested. Romberg is negative. No drift is seen.  Reflexes: Deep tendon reflexes are symmetric.   Assessment/Plan:  1. Progressive memory disturbance  2. History of depression  The patient will remain on Namenda, I have discussed the possibility of trying the Exelon patch in low dose to see if she can tolerate this, but the family indicates that she is having significant difficulty remembering to take her medications. The patient will follow-up in 6 months. A prescription was sent in for Prozac.  Tamara Alexanders MD 01/15/2017 11:16 AM  [facility] [address] [place], [place] [address]  Phone [phone] Fax [phone]

## 2017-02-01 ENCOUNTER — Other Ambulatory Visit: Payer: Self-pay | Admitting: *Deleted

## 2017-02-01 MED ORDER — MEMANTINE HCL 10 MG PO TABS: 10.0000 mg | ORAL_TABLET | Freq: Two times a day (BID) | ORAL | 11 refills | Status: DC

## 2017-07-13 ENCOUNTER — Encounter: Payer: Self-pay | Admitting: Adult Health

## 2017-07-13 ENCOUNTER — Ambulatory Visit: Payer: Medicare Other | Admitting: Adult Health

## 2017-07-13 ENCOUNTER — Encounter (INDEPENDENT_AMBULATORY_CARE_PROVIDER_SITE_OTHER): Payer: Self-pay

## 2017-07-13 VITALS — BP 130/86 | HR 68 | Wt 160.8 lb

## 2017-07-13 DIAGNOSIS — F0390 Unspecified dementia without behavioral disturbance: Secondary | ICD-10-CM | POA: Diagnosis not present

## 2017-07-13 NOTE — Patient Instructions (Signed)
Your Plan:  Continue Namenda Memory slightly declined Participate in activities Consider Exelon patch If your symptoms worsen or you develop new symptoms please let us know.    Thank you for coming to see [place] at [facility]. I hope we have been able to provide you high quality care today.  You may receive a patient satisfaction survey over the next few weeks. We would appreciate your feedback and comments so that we may continue to improve ourselves and the health of our patients.  Rivastigmine skin patches What is this medicine? RIVASTIGMINE (ri va STIG meen) is used to treat mild to moderate dementia caused by Parkinson's disease and mild to severe Alzheimer's disease. This medicine may be used for other purposes; ask your health care provider or pharmacist if you have questions. COMMON BRAND NAME(S): Exelon Patch What should I tell my health care provider before I take this medicine? They need to know if you have any of these conditions: -application site reaction during previous use of rivastigmine patch -heart disease -kidney disease -liver disease -lung or breathing disease, like asthma -seizures -slow, irregular heartbeat -stomach or intestine disease, ulcers, or stomach bleeding -trouble passing urine -an unusual or allergic reaction to rivastigmine, other medicines, foods, dyes, or preservatives -pregnant or trying to get pregnant -breast-feeding How should I use this medicine? This medicine is for external use only. Follow the directions on the prescription label. Always remove the old patch before you apply a new one. Apply to skin right after removing the protective liner. Do not cut or trim the patch. Apply to an area of the upper arm, chest, or back that is clean, dry and hairless. Avoid injured, irritated, oily, or calloused areas or where the patch will be rubbed by tight clothing or a waistband. Do not place over an area where lotion, cream, or  powder was recently used. Press firmly in place until the edges stick well. To prevent skin irritiation, do not apply to the same place more than once every 14 days. Change your patch at the same time each day. Do not use more often than directed. Do not stop using except on your doctor's advice. Contact your pediatrician or health care professional regarding the use of this medicine in children. Special care may be needed. Overdosage: If you think you have taken too much of this medicine contact a poison control center or emergency room at once. NOTE: This medicine is only for you. Do not share this medicine with others. What if I miss a dose? If you miss a dose, apply a new patch immediately. Then, apply the next patch at the usual time the next day after removing the previous patch. Do not apply 2 patches to make up for the missed one. If treatment is missed for 3 or more days, contact your healthcare provider for further instructions. What may interact with this medicine? -antihistamines for allergy, cough, and cold -atropine -certain medicines for bladder problems like oxybutynin, tolterodine -certain medicines for Parkinson's disease like benztropine, trihexyphenidyl -certain medicines for stomach problems like dicyclomine, hyoscyamine -glycopyrrolate -ipratropium -certain medicines for travel sickness like scopolamine -medicines that relax your muscles for surgery -other medicines for Alzheimer's disease This list may not describe all possible interactions. Give your health care provider a list of all the medicines, herbs, non-prescription drugs, or dietary supplements you use. Also tell them if you smoke, drink alcohol, or use illegal drugs. Some items may interact with your medicine. What should I watch for while  using this medicine? Visit your doctor or health care professional for regular checks on your progress. Check with your doctor or health care professional if your symptoms do not  start to get better or if they get worse. You may get drowsy or dizzy. Do not drive, use machinery, or do anything that needs mental alertness until you know how this drug affects you. Do not stand or sit up quickly, especially if you are an older patient. This reduces the risk of dizzy or fainting spells. Avoid saunas and prolonged exposure to sunlight. You may bathe, swim, shower, or participate in any of your normal activities while wearing this patch. If the patch falls off, apply a new patch for the rest of the day, then replace the patch the next day at the usual time. If you are going to have a magnetic resonance imaging (MRI) procedure, tell your MRI technician if you have this patch on your body. It must be removed before a MRI. What side effects may I notice from receiving this medicine? Side effects that you should report to your doctor or health care professional as soon as possible: -allergic reactions like skin rash, itching or hives, swelling of the face, lips, or tongue -application site reaction (such as skin redness, blisters, or swelling under or around the patch site) -changes in vision or balance -dizziness -feeling faint or lightheaded, falls -increase in frequency of passing urine or incontinence -nervousness, agitation, or increased confusion -redness, blistering, peeling or loosening of the skin, including inside the mouth -severe diarrhea -slow heartbeat or palpitations -stomach pain -sweating -uncontrollable movements -vomiting -weight loss Side effects that usually do not require medical attention (report to your doctor or health care professional if they continue or are bothersome): -headache -indigestion or heartburn -loss of appetite -mild diarrhea, especially when starting treatment -nausea This list may not describe all possible side effects. Call your doctor for medical advice about side effects. You may report side effects to FDA at 1-800-FDA-1088. Where  should I keep my medicine? Keep out of the reach of children. Store at room temperature between 15 and 30 degrees C (59 and 86 degrees F). Store in original pouch until just prior to use. Throw away any unused medicine after the expiration date. Dispose of used patches properly. Since used patches may still contain active medicine, fold the patch in half so that it sticks to itself prior to disposal. NOTE: This sheet is a summary. It may not cover all possible information. If you have questions about this medicine, talk to your doctor, pharmacist, or health care provider.  2018 Elsevier/Gold Standard (2014-12-21 15:39:13)

## 2017-07-13 NOTE — Progress Notes (Signed)
I have read the note, and I agree with the clinical assessment and plan.  Berlynn Warsame K Aryanah Enslow   

## 2017-07-13 NOTE — Progress Notes (Signed)
PATIENT: [name] DOB: 07/09/1928  REASON FOR VISIT: follow up HISTORY FROM: patient  HISTORY OF PRESENT ILLNESS: Today 07/13/17: [name] is an 84 year old female with a history of progressive memory disorder.  She returns today for follow-up.  She continues to live at [facility]  She reports that she is able to complete all ADLs independently however she does have a caregiver that comes in today tooffer her some supervision.  She reports that the caregiver does help her with things such as making her bed.  Patient denies any trouble sleeping.  Denies any significant changes in her mood or behavior.  Patient reports good appetite.  Denies hallucination.  She remains on Namenda.  In the past she has been unable to tolerate Aricept due to diarrhea.  At the last visit the Exelon patch was recommended however the patient and her family deferred.  HISTORY [name] is an 84 year old right-handed white female with a history of a progressive memory disorder and a history of depression. The patient has been on Namenda, she is tolerating this well, but she is commonly forgetting to take her evening dose of medication. The patient no longer operates a motor vehicle. The patient is in independent living, she has a caretaker coming in 3 times a week to help her. She does not socialize much, she tends to stay in her apartment. She indicates that her depression is well controlled, she is sleeping well at night, she is followed through psychiatry with Dr. Casimiro Needle. The patient returns to this office for an evaluation. Previously, she did not tolerate Aricept secondary to diarrhea.  REVIEW OF SYSTEMS: Out of a complete 14 system review of symptoms, the patient complains only of the following symptoms, and all other reviewed systems are negative.  See HPI  ALLERGIES: Allergies  Allergen Reactions  . Aricept [Donepezil Hcl]     Diarrhea  . Exelon [Rivastigmine Tartrate]     Diarrhea    HOME  MEDICATIONS: Outpatient Medications Prior to Visit  Medication Sig Dispense Refill  . FLUoxetine (PROZAC) 40 MG capsule Take 1 capsule (40 mg total) by mouth daily. 30 capsule 11  . memantine (NAMENDA) 10 MG tablet Take 1 tablet (10 mg total) by mouth 2 (two) times daily. 60 tablet 11   No facility-administered medications prior to visit.     PAST MEDICAL HISTORY: Past Medical History:  Diagnosis Date  . Allergic rhinitis   . Alzheimer disease 01/15/2017  . Anemia   . Anxiety   . Anxiety and depression   . Cataract   . Depression   . Diverticulosis of colon   . DJD (degenerative joint disease)   . Gastric polyps   . GERD (gastroesophageal reflux disease)   . Hearing loss   . Hiatal hernia   . History of pericarditis   . Hypercholesteremia   . IBS (irritable bowel syndrome)   . Lumbar back pain   . Memory loss   . Monoclonal gammopathy   . Osteoporosis    osteopenia  . Presence of permanent cardiac pacemaker   . Sick sinus syndrome ([place])     PAST SURGICAL HISTORY: Past Surgical History:  Procedure Laterality Date  . APPENDECTOMY    . DILATION AND CURETTAGE OF UTERUS  03/24/10  . PACEMAKER PLACEMENT     most recent generator replaced 2009 by Dr Olevia Perches    FAMILY HISTORY: Family History  Problem Relation Age of Onset  . Heart failure Mother   . Heart attack  Father   . Rectal cancer Daughter   . Colon cancer Daughter   . Parkinsonism Sister   . Pancreatic cancer Brother   . Pancreatic cancer Sister   . Brain cancer Brother   . Heart disease Brother   . Colon cancer Daughter   . Diabetes Sister   . Clotting disorder Sister   . Crohn's disease Sister        twin  . Lung cancer Sister        twin    SOCIAL HISTORY: Social History   Socioeconomic History  . Marital status: Widowed    Spouse name: Not on file  . Number of children: 3  . Years of education: 50  . Highest education level: Not on file  Social Needs  . Financial resource strain: Not on  file  . Food insecurity - worry: Not on file  . Food insecurity - inability: Not on file  . Transportation needs - medical: Not on file  . Transportation needs - non-medical: Not on file  Occupational History    Employer: RETIRED    Comment: retired  Tobacco Use  . Smoking status: Former Smoker    Packs/day: 0.50    Years: 10.00    Pack years: 5.00    Types: Cigarettes    Last attempt to quit: 05/12/1987    Years since quitting: 30.1  . Smokeless tobacco: Never Used  Substance and Sexual Activity  . Alcohol use: No    Comment: wine occasionally  . Drug use: No  . Sexual activity: Not on file  Other Topics Concern  . Not on file  Social History Narrative   Pt has 12 siblings.   Alcohol use- no   Drug use- no   Daily caffeine use-1-2 cup      Patient is a widow.   Retired   [employer] school education.      Right handed            PHYSICAL EXAM  Vitals:   07/13/17 1326  BP: 130/86  Pulse: 68  Weight: 160 lb 12.8 oz (72.9 kg)   Body mass index is 28.48 kg/m.  MMSE - Mini Mental State Exam 07/13/2017 01/15/2017 07/15/2016  Orientation to time _0 Orientation to Place _1 Registration _2 Attention/ Calculation _3 Recall _4 Language- name 2 objects _5 Language- repeat _6 Language- follow 3 step command _7 Language- read & follow direction _8 Write a sentence _9 Copy design _10 Total score _11 Generalized: Well developed, in no acute distress   Neurological examination  Mentation: Alert.  Follows all commands speech and language fluent Cranial nerve II-XII: Pupils were equal round reactive to light. Extraocular movements were full, visual field were full on confrontational test. Facial sensation and strength were normal. Uvula tongue midline. Head turning and shoulder shrug  were normal and symmetric. Motor: The motor testing reveals 5 over 5 strength of all 4 extremities. Good symmetric motor tone is noted  throughout.  Sensory: Sensory testing is intact to soft touch on all 4 extremities. No evidence of extinction is noted.  Coordination: Cerebellar testing reveals good finger-nose-finger and heel-to-shin bilaterally.  Gait and station: Gait is normal.    DIAGNOSTIC DATA (LABS, IMAGING, TESTING) - I reviewed patient records, labs, notes, testing  and imaging myself where available.  Lab Results  Component Value Date   WBC 6.6 10/23/2013   HGB 13.6 10/23/2013   HCT 41.0 10/23/2013   MCV 90.6 10/23/2013   PLT 213.0 10/23/2013      Component Value Date/Time   NA 140 10/23/2013 0959   K 4.2 10/23/2013 0959   CL 105 10/23/2013 0959   CO2 28 10/23/2013 0959   GLUCOSE 117 (H) 10/23/2013 0959   BUN 16 10/23/2013 0959   CREATININE 0.9 10/23/2013 0959   CALCIUM 9.2 10/23/2013 0959   PROT 7.1 10/23/2013 0959   ALBUMIN 4.0 10/23/2013 0959   AST 21 10/23/2013 0959   ALT 15 10/23/2013 0959   ALKPHOS 71 10/23/2013 0959   BILITOT 0.3 10/23/2013 0959   GFRNONAA >60 06/13/2009 0431   GFRAA  06/13/2009 0431    >60        The eGFR has been calculated using the MDRD equation. This calculation has not been validated in all clinical situations. eGFR's persistently <60 mL/min signify possible Chronic Kidney Disease.   Lab Results  Component Value Date   CHOL 295 (H) 10/23/2013   HDL 69.00 10/23/2013   LDLCALC 204 (H) 10/23/2013   LDLDIRECT 143.7 04/14/2011   TRIG 109.0 10/23/2013   CHOLHDL 4 10/23/2013   Lab Results  Component Value Date   HGBA1C (H) 06/10/2009    6.2 (NOTE) The ADA recommends the following therapeutic goal for glycemic control related to Hgb A1c measurement: Goal of therapy: <6.5 Hgb A1c  Reference: American Diabetes Association: Clinical Practice Recommendations 2010, Diabetes Care, 2010, 33: (Suppl  1).   Lab Results  Component Value Date   VITAMINB12 420 08/21/2010   Lab Results  Component Value Date   TSH 0.68 10/23/2013      ASSESSMENT AND PLAN 84  y.o. year old female  has a past medical history of Allergic rhinitis, Alzheimer disease (01/15/2017), Anemia, Anxiety, Anxiety and depression, Cataract, Depression, Diverticulosis of colon, DJD (degenerative joint disease), Gastric polyps, GERD (gastroesophageal reflux disease), Hearing loss, Hiatal hernia, History of pericarditis, Hypercholesteremia, IBS (irritable bowel syndrome), Lumbar back pain, Memory loss, Monoclonal gammopathy, Osteoporosis, Presence of permanent cardiac pacemaker, and Sick sinus syndrome ([place]). here with :  1.  Dementia  The patient is married for has declined slightly.  She will continue on Namenda.  We discussed potentially starting the Exelon patch.  I reviewed potential side effects as well as provided him with a handout reviewing the medication.  The patient states that she will look over this and let us know if she wants to start this.  Patient encouraged to participate in physical activity.  She is advised that if her symptoms worsen or she develops new symptoms she should let us know.  She will follow-up in 6 months or sooner if needed.   I spent 15 minutes with the patient. 50% of this time was spent discussing her memory score  Ward Givens, MSN, NP-C 07/13/2017, 1:57 PM [facility] [address], [place], [place] [address] [phone]

## 2017-07-15 ENCOUNTER — Ambulatory Visit: Payer: Medicare Other | Admitting: Adult Health

## 2018-01-18 ENCOUNTER — Other Ambulatory Visit: Payer: Self-pay

## 2018-01-18 ENCOUNTER — Telehealth: Payer: Self-pay | Admitting: Adult Health

## 2018-01-18 ENCOUNTER — Ambulatory Visit: Payer: Medicare Other | Admitting: Adult Health

## 2018-01-18 ENCOUNTER — Encounter: Payer: Self-pay | Admitting: Adult Health

## 2018-01-18 VITALS — BP 107/68 | HR 75 | Resp 22 | Ht 63.0 in | Wt 158.0 lb

## 2018-01-18 DIAGNOSIS — R413 Other amnesia: Secondary | ICD-10-CM

## 2018-01-18 MED ORDER — FLUOXETINE HCL 40 MG PO CAPS: 40.0000 mg | ORAL_CAPSULE | Freq: Every day | ORAL | 11 refills | Status: DC

## 2018-01-18 MED ORDER — MEMANTINE HCL 10 MG PO TABS: 10.0000 mg | ORAL_TABLET | Freq: Two times a day (BID) | ORAL | 11 refills | Status: DC

## 2018-01-18 NOTE — Progress Notes (Signed)
PATIENT: [name] DOB: 28-Feb-1929  REASON FOR VISIT: follow up HISTORY FROM: patient  HISTORY OF PRESENT ILLNESS: Today 01/18/18: [name] is an 84 year old female with a history of progressive memory disturbance.  She returns today for follow-up.  She feels that her memory has remained stable.  She is able to complete all ADLs independently.  She continues to reside at [facility].  She has a caregiver that comes in 3 mornings a week to offer assistance if needed.  The patient reports that she is sleeping well.  Denies any changes in her mood or behavior.  Reports good appetite.  Her daughter manages her appointments.  Her aide helps her with her medications.  She remains on Namenda and Prozac.  He returns today for evaluation.  HISTORY 07/13/17: [name] is an 84 year old female with a history of progressive memory disorder.  She returns today for follow-up.  She continues to live at [facility]  She reports that she is able to complete all ADLs independently however she does have a caregiver that comes in today tooffer her some supervision.  She reports that the caregiver does help her with things such as making her bed.  Patient denies any trouble sleeping.  Denies any significant changes in her mood or behavior.  Patient reports good appetite.  Denies hallucination.  She remains on Namenda.  In the past she has been unable to tolerate Aricept due to diarrhea.  At the last visit the Exelon patch was recommended however the patient and her family deferred.  REVIEW OF SYSTEMS: Out of a complete 14 system review of symptoms, the patient complains only of the following symptoms, and all other reviewed systems are negative.  Incontinence of bladder  ALLERGIES: Allergies  Allergen Reactions  . Aricept [Donepezil Hcl]     Diarrhea  . Exelon [Rivastigmine Tartrate]     Diarrhea    HOME MEDICATIONS: Outpatient Medications Prior to Visit  Medication Sig Dispense Refill  .  FLUoxetine (PROZAC) 40 MG capsule Take 1 capsule (40 mg total) by mouth daily. 30 capsule 11  . memantine (NAMENDA) 10 MG tablet Take 1 tablet (10 mg total) by mouth 2 (two) times daily. 60 tablet 11   No facility-administered medications prior to visit.     PAST MEDICAL HISTORY: Past Medical History:  Diagnosis Date  . Allergic rhinitis   . Alzheimer disease 01/15/2017  . Anemia   . Anxiety   . Anxiety and depression   . Cataract   . Depression   . Diverticulosis of colon   . DJD (degenerative joint disease)   . Gastric polyps   . GERD (gastroesophageal reflux disease)   . Hearing loss   . Hiatal hernia   . History of pericarditis   . Hypercholesteremia   . IBS (irritable bowel syndrome)   . Lumbar back pain   . Memory loss   . Monoclonal gammopathy   . Osteoporosis    osteopenia  . Presence of permanent cardiac pacemaker   . Sick sinus syndrome ([place])     PAST SURGICAL HISTORY: Past Surgical History:  Procedure Laterality Date  . APPENDECTOMY    . DILATION AND CURETTAGE OF UTERUS  03/24/10  . PACEMAKER PLACEMENT     most recent generator replaced 2009 by Dr Olevia Perches    FAMILY HISTORY: Family History  Problem Relation Age of Onset  . Heart failure Mother   . Heart attack Father   . Rectal cancer Daughter   . Colon  cancer Daughter   . Parkinsonism Sister   . Pancreatic cancer Brother   . Pancreatic cancer Sister   . Brain cancer Brother   . Heart disease Brother   . Colon cancer Daughter   . Diabetes Sister   . Clotting disorder Sister   . Crohn's disease Sister        twin  . Lung cancer Sister        twin    SOCIAL HISTORY: Social History   Socioeconomic History  . Marital status: Widowed    Spouse name: Not on file  . Number of children: 3  . Years of education: 55  . Highest education level: Not on file  Occupational History    Employer: RETIRED    Comment: retired  Scientific laboratory technician  . Financial resource strain: Not on file  . Food insecurity:      Worry: Not on file    Inability: Not on file  . Transportation needs:    Medical: Not on file    Non-medical: Not on file  Tobacco Use  . Smoking status: Former Smoker    Packs/day: 0.50    Years: 10.00    Pack years: 5.00    Types: Cigarettes    Last attempt to quit: 05/12/1987    Years since quitting: 30.7  . Smokeless tobacco: Never Used  Substance and Sexual Activity  . Alcohol use: No    Comment: wine occasionally  . Drug use: No  . Sexual activity: Not on file  Lifestyle  . Physical activity:    Days per week: Not on file    Minutes per session: Not on file  . Stress: Not on file  Relationships  . Social connections:    Talks on phone: Not on file    Gets together: Not on file    Attends religious service: Not on file    Active member of club or organization: Not on file    Attends meetings of clubs or organizations: Not on file    Relationship status: Not on file  . Intimate partner violence:    Fear of current or ex partner: Not on file    Emotionally abused: Not on file    Physically abused: Not on file    Forced sexual activity: Not on file  Other Topics Concern  . Not on file  Social History Narrative   Pt has 12 siblings.   Alcohol use- no   Drug use- no   Daily caffeine use-1-2 cup      Patient is a widow.   Retired   [employer] school education.      Right handed            PHYSICAL EXAM  Vitals:   01/18/18 1406  BP: 107/68  Pulse: 75  Resp: (!) 22  Weight: 158 lb (71.7 kg)  Height: 5' 3"  (1.6 m)   Body mass index is 27.99 kg/m.   MMSE - Mini Mental State Exam 01/18/2018 07/13/2017 01/15/2017  Orientation to time 3 3 3   Orientation to Place 5 3 5   Registration 3 3 3   Attention/ Calculation 5 2 5   Recall 2 2 1   Language- name 2 objects 2 2 2   Language- repeat 1 1 1   Language- follow 3 step command 3 3 3   Language- read & follow direction 1 1 1   Write a sentence 1 1 1   Copy design 1 1 1   Total score 27 22 26  Generalized: Well  developed, in no acute distress   Neurological examination  Mentation: Alert oriented to time, place, history taking. Follows all commands speech and language fluent Cranial nerve II-XII: Pupils were equal round reactive to light. Extraocular movements were full, visual field were full on confrontational test. Facial sensation and strength were normal. Uvula tongue midline. Head turning and shoulder shrug  were normal and symmetric. Motor: The motor testing reveals 5 over 5 strength of all 4 extremities. Good symmetric motor tone is noted throughout.  Sensory: Sensory testing is intact to soft touch on all 4 extremities. No evidence of extinction is noted.  Coordination: Cerebellar testing reveals good finger-nose-finger and heel-to-shin bilaterally.  Gait and station: Gait is normal.  Reflexes: Deep tendon reflexes are symmetric and normal bilaterally.   DIAGNOSTIC DATA (LABS, IMAGING, TESTING) - I reviewed patient records, labs, notes, testing and imaging myself where available.  Lab Results  Component Value Date   WBC 6.6 10/23/2013   HGB 13.6 10/23/2013   HCT 41.0 10/23/2013   MCV 90.6 10/23/2013   PLT 213.0 10/23/2013      Component Value Date/Time   NA 140 10/23/2013 0959   K 4.2 10/23/2013 0959   CL 105 10/23/2013 0959   CO2 28 10/23/2013 0959   GLUCOSE 117 (H) 10/23/2013 0959   BUN 16 10/23/2013 0959   CREATININE 0.9 10/23/2013 0959   CALCIUM 9.2 10/23/2013 0959   PROT 7.1 10/23/2013 0959   ALBUMIN 4.0 10/23/2013 0959   AST 21 10/23/2013 0959   ALT 15 10/23/2013 0959   ALKPHOS 71 10/23/2013 0959   BILITOT 0.3 10/23/2013 0959   GFRNONAA >60 06/13/2009 0431   GFRAA  06/13/2009 0431    >60        The eGFR has been calculated using the MDRD equation. This calculation has not been validated in all clinical situations. eGFR's persistently <60 mL/min signify possible Chronic Kidney Disease.   Lab Results  Component Value Date   CHOL 295 (H) 10/23/2013   HDL 69.00  10/23/2013   LDLCALC 204 (H) 10/23/2013   LDLDIRECT 143.7 04/14/2011   TRIG 109.0 10/23/2013   CHOLHDL 4 10/23/2013   Lab Results  Component Value Date   HGBA1C (H) 06/10/2009    6.2 (NOTE) The ADA recommends the following therapeutic goal for glycemic control related to Hgb A1c measurement: Goal of therapy: <6.5 Hgb A1c  Reference: American Diabetes Association: Clinical Practice Recommendations 2010, Diabetes Care, 2010, 33: (Suppl  1).   Lab Results  Component Value Date   VITAMINB12 420 08/21/2010   Lab Results  Component Value Date   TSH 0.68 10/23/2013      ASSESSMENT AND PLAN 84 y.o. year old female  has a past medical history of Allergic rhinitis, Alzheimer disease (01/15/2017), Anemia, Anxiety, Anxiety and depression, Cataract, Depression, Diverticulosis of colon, DJD (degenerative joint disease), Gastric polyps, GERD (gastroesophageal reflux disease), Hearing loss, Hiatal hernia, History of pericarditis, Hypercholesteremia, IBS (irritable bowel syndrome), Lumbar back pain, Memory loss, Monoclonal gammopathy, Osteoporosis, Presence of permanent cardiac pacemaker, and Sick sinus syndrome ([place]). here with:  1.  Memory disturbance  The patient's memory score has remained stable.  She will continue on Namenda.  She will also continue on Prozac.  I advised that if her symptoms worsen or she develops new symptoms she should let us know.  She will follow-up in 6 months or sooner if needed.   I spent 15 minutes with the patient. 50% of this time was spent reviewing  her memory score   Ward Givens, MSN, NP-C 01/18/2018, 2:12 PM [facility] [address] [place], [place] [address] [phone]

## 2018-01-18 NOTE — Telephone Encounter (Signed)
Refill will be addressed during ov this afternoon/fim

## 2018-01-18 NOTE — Patient Instructions (Signed)
Your Plan:  Continue Namenda Memory score is stable Continue Prozac If your symptoms worsen or you develop new symptoms please let us know.   Thank you for coming to see [place] at [facility]. I hope we have been able to provide you high quality care today.  You may receive a patient satisfaction survey over the next few weeks. We would appreciate your feedback and comments so that we may continue to improve ourselves and the health of our patients.

## 2018-01-18 NOTE — Progress Notes (Signed)
I have read the note, and I agree with the clinical assessment and plan.  Darilyn Storbeck K Alaria Oconnor   

## 2018-01-18 NOTE — Telephone Encounter (Signed)
[place] [phone] called request new script for FLUoxetine (PROZAC) 40 MG capsule to [facility] if the patient is to continue the medication (the old one expired). She is aware the pt has an appt this afternoon

## 2018-04-26 ENCOUNTER — Encounter (INDEPENDENT_AMBULATORY_CARE_PROVIDER_SITE_OTHER): Payer: Medicare Other | Admitting: Ophthalmology

## 2018-05-20 NOTE — Progress Notes (Addendum)
[facility] Note  05/23/2018     CHIEF COMPLAINT Patient presents for Retina Evaluation   HISTORY OF PRESENT ILLNESS: [name] is a 85 y.o. female who presents to the clinic today for:   HPI    Retina Evaluation    In both eyes.  This started 1 month ago.  Associated Symptoms Negative for Flashes, Pain, Trauma, Fever, Weight Loss, Scalp Tenderness, Redness, Floaters, Distortion, Photophobia, Jaw Claudication, Fatigue, Shoulder/Hip pain, Glare and Blind Spot.  Context:  distance vision, mid-range vision and near vision.  Treatments tried include injection.  I, the attending physician,  performed the HPI with the patient and updated documentation appropriately.          Comments    Referral of DR. Albina Billet for retina eval. Patient accompanied by daughter today. Patient states she was seeing a Psychologist, occupational( MD unknown )appx 2 yrs ago, she was receiving injections, pt does not recall having visual issues, daughter states mother had EXU AMD.Pt denies gtt's/vit's        Last edited by [name], MD on 05/24/2018 12:40 PM. (History)    pt is a pt of Dr. Ellie Lunch and used to see the retina specialist at [facility] and received injections in her left eye  Referring physician: Luberta Mutter, MD [place], [place] [id number]  HISTORICAL INFORMATION:   Selected notes from the [place] Referred by Dr. Luberta Mutter for concern of CNV OS LEE: 11.25.19 (C. [facility]) Ocular Hx-previously seen by Dr. Starling Manns and received 4 IVA treatments, last one December 2016, cataracts PMH-alzheimers, anemia, aniety, depression, HLD    CURRENT MEDICATIONS: No current outpatient medications on file. (Ophthalmic Drugs)   No current facility-administered medications for this visit.  (Ophthalmic Drugs)   Current Outpatient Medications (Other)  Medication Sig  . FLUoxetine (PROZAC) 40 MG capsule Take 1 capsule (40 mg  total) by mouth daily.  . memantine (NAMENDA) 10 MG tablet Take 1 tablet (10 mg total) by mouth 2 (two) times daily.   Current Facility-Administered Medications (Other)  Medication Route  . Bevacizumab (AVASTIN) SOLN 1.25 mg Intravitreal      REVIEW OF SYSTEMS: ROS    Positive for: Eyes   Negative for: Constitutional, Gastrointestinal, Neurological, Skin, Genitourinary, Musculoskeletal, HENT, Endocrine, Cardiovascular, Respiratory, Psychiatric, Allergic/Imm, Heme/Lymph   Last edited by Zenovia Jordan, LPN on 8/84/1660  6:30 PM. (History)       ALLERGIES Allergies  Allergen Reactions  . Aricept [Donepezil Hcl]     Diarrhea  . Exelon [Rivastigmine Tartrate]     Diarrhea    PAST MEDICAL HISTORY Past Medical History:  Diagnosis Date  . Allergic rhinitis   . Alzheimer disease ([place]) 01/15/2017  . Anemia   . Anxiety   . Anxiety and depression   . Cataract   . Depression   . Diverticulosis of colon   . DJD (degenerative joint disease)   . Gastric polyps   . GERD (gastroesophageal reflux disease)   . Hearing loss   . Hiatal hernia   . History of pericarditis   . Hypercholesteremia   . IBS (irritable bowel syndrome)   . Lumbar back pain   . Memory loss   . Monoclonal gammopathy   . Osteoporosis    osteopenia  . Presence of permanent cardiac pacemaker   . Sick sinus syndrome [facility])    Past Surgical History:  Procedure Laterality Date  . APPENDECTOMY    . DILATION  AND CURETTAGE OF UTERUS  03/24/10  . PACEMAKER PLACEMENT     most recent generator replaced 2009 by Dr Olevia Perches    FAMILY HISTORY Family History  Problem Relation Age of Onset  . Heart failure Mother   . Heart attack Father   . Rectal cancer Daughter   . Colon cancer Daughter   . Parkinsonism Sister   . Pancreatic cancer Brother   . Pancreatic cancer Sister   . Brain cancer Brother   . Heart disease Brother   . Colon cancer Daughter   . Diabetes Sister   . Clotting disorder Sister   . Crohn's  disease Sister        twin  . Lung cancer Sister        twin    SOCIAL HISTORY Social History   Tobacco Use  . Smoking status: Former Smoker    Packs/day: 0.50    Years: 10.00    Pack years: 5.00    Types: Cigarettes    Last attempt to quit: 05/12/1987    Years since quitting: 31.0  . Smokeless tobacco: Never Used  Substance Use Topics  . Alcohol use: No    Comment: wine occasionally  . Drug use: No         OPHTHALMIC EXAM:  Base Eye Exam    Visual Acuity (Snellen - Linear)      Right Left   Dist [place] 20/30 +2 20/50 +1   Dist ph [place] 20/25 -1 NI       Tonometry (Tonopen, 1:45 PM)      Right Left   Pressure 15 15       Pupils      Dark Light Shape React APD   Right 3 2 Round Brisk None   Left 3 2 Round Brisk None       Visual Fields (Counting fingers)      Left Right    Full Full       Extraocular Movement      Right Left    Full, Ortho Full, Ortho       Neuro/Psych    Oriented x3:  Yes   Mood/Affect:  Normal       Dilation    Both eyes:  1.0% Mydriacyl, 2.5% Phenylephrine @ 1:45 PM        Slit Lamp and Fundus Exam    Slit Lamp Exam      Right Left   Lids/Lashes Dermatochalasis - upper lid, Meibomian gland dysfunction Dermatochalasis - upper lid, Meibomian gland dysfunction   Conjunctiva/Sclera White and quiet White and quiet   Cornea Arcus, crocodile shegreen, mild EBMD Arcus, 2+ endo pigment, mild EBMD   Anterior Chamber deep and clear deep and clear   Iris Round and dilated Round and poorly dilated to 71mm   Lens PC IOL in good position, trace Posterior capsular opacification ST PC IOL in good position   Vitreous Vitreous syneresis Vitreous syneresis       Fundus Exam      Right Left   Disc Compact Compact, +CNV with exudate ST disc   C/D Ratio 0.1 0.2   Macula Blunted foveal reflex, Drusen, Retinal pigment epithelial mottling, No heme or edema Blunted foveal reflex, +edema and exudate ST macula   Vessels Vascular attenuation, Tortuous,  AV crossing changes Vascular attenuation, Tortuous, AV crossing changes   Periphery Attached, mild reticular degeneration, mild peripheral drusen View limited by poor dilation, mild reticular degeneration  IMAGING AND PROCEDURES  Imaging and Procedures for @TODAY @  OCT, Retina - OU - Both Eyes       Right Eye Quality was good. Central Foveal Thickness: 290. Progression has no prior data. Findings include normal foveal contour, no SRF, no IRF, retinal drusen , pigment epithelial detachment.   Left Eye Quality was good. Central Foveal Thickness: 295. Progression has no prior data. Findings include subretinal fluid, subretinal hyper-reflective material, intraretinal hyper-reflective material, outer retinal atrophy, intraretinal fluid, normal foveal contour.   Notes *Images captured and stored on drive  Diagnosis / Impression:  OD: non-exu ARMD with central PED v vitelliform like lesion OS: peripapillary CNV with +SRF/IRF    Clinical management:  See below  Abbreviations: NFP - Normal foveal profile. CME - cystoid macular edema. PED - pigment epithelial detachment. IRF - intraretinal fluid. SRF - subretinal fluid. EZ - ellipsoid zone. ERM - epiretinal membrane. ORA - outer retinal atrophy. ORT - outer retinal tubulation. SRHM - subretinal hyper-reflective material        Intravitreal Injection, Pharmacologic Agent - OS - Left Eye       Time Out 05/23/2018. 3:08 PM. Confirmed correct patient, procedure, site, and patient consented.   Anesthesia Topical anesthesia was used. Anesthetic medications included Lidocaine 2%, Proparacaine 0.5%.   Procedure Preparation included 5% betadine to ocular surface, eyelid speculum. A 30 gauge needle was used.   Injection:  1.25 mg Bevacizumab (AVASTIN) SOLN   NDC: 90300-923-30, Lot: [id number] , Expiration date: 06/21/2018   Route: Intravitreal, Site: Left Eye, Waste: 0 mL  Post-op Post injection exam found visual acuity of  at least counting fingers. The patient tolerated the procedure well. There were no complications. The patient received written and verbal post procedure care education.                 ASSESSMENT/PLAN:    ICD-10-CM   1. Exudative age-related macular degeneration of left eye with active choroidal neovascularization (HCC) H35.3221 Intravitreal Injection, Pharmacologic Agent - OS - Left Eye    Bevacizumab (AVASTIN) SOLN 1.25 mg  2. Neovascular membrane of left choroid artery H35.052 Intravitreal Injection, Pharmacologic Agent - OS - Left Eye    Bevacizumab (AVASTIN) SOLN 1.25 mg  3. Retinal edema H35.81 OCT, Retina - OU - Both Eyes  4. Intermediate stage nonexudative age-related macular degeneration of right eye H35.3112   5. Pseudophakia of both eyes Z96.1     1-3. Exudative age related macular degeneration w/ peripapillary CNV, OS  - former pt of Dr. 15/03/2019 and Dr. Starling Manns   - hx of IVA treatments x4 with Dr. Ricki Miller - last injection 12/16  - The incidence pathology and anatomy of wet AMD discussed   - The ANCHOR, MARINA, CATT and VIEW trials discussed with patient.    - discussed treatment options including observation vs intravitreal anti-VEGF agents such as Avastin, Lucentis, Eylea.    - Risks of endophthalmitis and vascular occlusive events and atrophic changes discussed with patient  - OCT shows peripapillary CNV with +SRF/IRF  - recommend IVA #5 OS today, 01.13.20  - pt wishes to be treated with IVA  - RBA of procedure discussed, questions answered  - informed consent obtained and signed  - see procedure note  - f/u in 4 wks  4. Age related macular degeneration, non-exudative, OD  - The incidence, anatomy, and pathology of dry AMD, risk of progression, and the AREDS and AREDS 2 study including smoking risks discussed with patient.  - Recommend amsler grid  monitoring  5. Pseudophakia OU  - s/p CE/IOL  - beautiful surgery, doing well  - pt unsure who the  surgeon was  - monitor    Ophthalmic Meds Ordered this visit:  Meds ordered this encounter  Medications  . Bevacizumab (AVASTIN) SOLN 1.25 mg       Return in about 4 weeks (around 06/20/2018) for f/u exu ARMD OS, DFE, OCT.  There are no Patient Instructions on file for this visit.   Explained the diagnoses, plan, and follow up with the patient and they expressed understanding.  Patient expressed understanding of the importance of proper follow up care.   This document serves as a record of services personally performed by Gardiner Sleeper, MD, PhD. It was created on their behalf by Ernest Mallick, OA, an ophthalmic assistant. The creation of this record is the provider's dictation and/or activities during the visit.    Electronically signed by: Ernest Mallick, OA  01.10.2020 12:49 PM    Gardiner Sleeper, M.D., Ph.D. Diseases & Surgery of the Retina and Vitreous [facility]  I have reviewed the above documentation for accuracy and completeness, and I agree with the above. Gardiner Sleeper, M.D., Ph.D. 05/24/18 12:49 PM    Abbreviations: M myopia (nearsighted); A astigmatism; H hyperopia (farsighted); P presbyopia; Mrx spectacle prescription;  CTL contact lenses; OD right eye; OS left eye; OU both eyes  XT exotropia; ET esotropia; PEK punctate epithelial keratitis; PEE punctate epithelial erosions; DES dry eye syndrome; MGD meibomian gland dysfunction; ATs artificial tears; PFAT's preservative free artificial tears; [place] nuclear sclerotic cataract; PSC posterior subcapsular cataract; ERM epi-retinal membrane; PVD posterior vitreous detachment; RD retinal detachment; DM diabetes mellitus; DR diabetic retinopathy; NPDR non-proliferative diabetic retinopathy; PDR proliferative diabetic retinopathy; CSME clinically significant macular edema; DME diabetic macular edema; dbh dot blot hemorrhages; CWS cotton wool spot; POAG primary open angle glaucoma; C/D cup-to-disc ratio;  HVF humphrey visual field; GVF goldmann visual field; OCT optical coherence tomography; IOP intraocular pressure; BRVO Branch retinal vein occlusion; CRVO central retinal vein occlusion; CRAO central retinal artery occlusion; BRAO branch retinal artery occlusion; RT retinal tear; SB scleral buckle; PPV pars plana vitrectomy; VH Vitreous hemorrhage; PRP panretinal laser photocoagulation; IVK intravitreal kenalog; VMT vitreomacular traction; MH Macular hole;  NVD neovascularization of the disc; NVE neovascularization elsewhere; AREDS age related eye disease study; ARMD age related macular degeneration; POAG primary open angle glaucoma; EBMD epithelial/anterior basement membrane dystrophy; ACIOL anterior chamber intraocular lens; IOL intraocular lens; PCIOL posterior chamber intraocular lens; Phaco/IOL phacoemulsification with intraocular lens placement; [place] photorefractive keratectomy; LASIK laser assisted in situ keratomileusis; HTN hypertension; DM diabetes mellitus; COPD chronic obstructive pulmonary disease

## 2018-05-23 ENCOUNTER — Ambulatory Visit (INDEPENDENT_AMBULATORY_CARE_PROVIDER_SITE_OTHER): Payer: Medicare Other | Admitting: Ophthalmology

## 2018-05-23 ENCOUNTER — Encounter (INDEPENDENT_AMBULATORY_CARE_PROVIDER_SITE_OTHER): Payer: Self-pay | Admitting: Ophthalmology

## 2018-05-23 DIAGNOSIS — IMO0001 Reserved for inherently not codable concepts without codable children: Secondary | ICD-10-CM

## 2018-05-23 DIAGNOSIS — H353112 Nonexudative age-related macular degeneration, right eye, intermediate dry stage: Secondary | ICD-10-CM

## 2018-05-23 DIAGNOSIS — H353221 Exudative age-related macular degeneration, left eye, with active choroidal neovascularization: Secondary | ICD-10-CM

## 2018-05-23 DIAGNOSIS — H35052 Retinal neovascularization, unspecified, left eye: Secondary | ICD-10-CM

## 2018-05-23 DIAGNOSIS — Z961 Presence of intraocular lens: Secondary | ICD-10-CM

## 2018-05-23 DIAGNOSIS — H3581 Retinal edema: Secondary | ICD-10-CM | POA: Diagnosis not present

## 2018-05-24 ENCOUNTER — Encounter (INDEPENDENT_AMBULATORY_CARE_PROVIDER_SITE_OTHER): Payer: Self-pay | Admitting: Ophthalmology

## 2018-05-24 MED ORDER — BEVACIZUMAB CHEMO INJECTION 1.25MG/0.05ML SYRINGE FOR KALEIDOSCOPE
1.2500 mg | INTRAVITREAL | Status: AC
  Administered 2018-05-24: 1.25 mg via INTRAVITREAL

## 2018-06-15 NOTE — Progress Notes (Signed)
[facility] Note  06/20/2018     CHIEF COMPLAINT Patient presents for Retina Follow Up   HISTORY OF PRESENT ILLNESS: [name] is a 85 y.o. female who presents to the clinic today for:   HPI    Retina Follow Up    Patient presents with  Wet AMD.  In left eye.  This started 4 weeks ago.  Severity is moderate.  Duration of 4 weeks.  Since onset it is stable.  I, the attending physician,  performed the HPI with the patient and updated documentation appropriately.          Comments    85 y/o female pt here for 4 wk f/u for wet ARMD w/CNV OS.  No change in [place] OU.  Denies pain, flashes, floaters.  No gtts.       Last edited by [name], MD on 06/20/2018  4:04 PM. (History)    pt states she is doing good,   Referring physician: [name], MD [place], [place] [id number]  HISTORICAL INFORMATION:   Selected notes from the MEDICAL RECORD NUMBER Referred by Dr. Luberta Mutter for concern of CNV OS LEE: 11.25.19 (C. [facility]) Ocular Hx-previously seen by Dr. Starling Manns and received 4 IVA treatments, last one December 2016, cataracts PMH-alzheimers, anemia, aniety, depression, HLD    CURRENT MEDICATIONS: No current outpatient medications on file. (Ophthalmic Drugs)   No current facility-administered medications for this visit.  (Ophthalmic Drugs)   Current Outpatient Medications (Other)  Medication Sig  . FLUoxetine (PROZAC) 40 MG capsule Take 1 capsule (40 mg total) by mouth daily.  . memantine (NAMENDA) 10 MG tablet Take 1 tablet (10 mg total) by mouth 2 (two) times daily.   Current Facility-Administered Medications (Other)  Medication Route  . Bevacizumab (AVASTIN) SOLN 1.25 mg Intravitreal  . Bevacizumab (AVASTIN) SOLN 1.25 mg Intravitreal      REVIEW OF SYSTEMS: ROS    Positive for: Gastrointestinal, Neurological, Musculoskeletal, Eyes   Negative for: Constitutional, Skin, Genitourinary, HENT, Endocrine,  Cardiovascular, Respiratory, Psychiatric, Allergic/Imm, Heme/Lymph   Last edited by [name], COA on 06/20/2018  2:36 PM. (History)       ALLERGIES Allergies  Allergen Reactions  . Aricept [Donepezil Hcl]     Diarrhea  . Exelon [Rivastigmine Tartrate]     Diarrhea    PAST MEDICAL HISTORY Past Medical History:  Diagnosis Date  . Allergic rhinitis   . Alzheimer disease ([place]) 01/15/2017  . Anemia   . Anxiety   . Anxiety and depression   . Cataract   . Depression   . Diverticulosis of colon   . DJD (degenerative joint disease)   . Gastric polyps   . GERD (gastroesophageal reflux disease)   . Hearing loss   . Hiatal hernia   . History of pericarditis   . Hypercholesteremia   . IBS (irritable bowel syndrome)   . Lumbar back pain   . Memory loss   . Monoclonal gammopathy   . Osteoporosis    osteopenia  . Presence of permanent cardiac pacemaker   . Sick sinus syndrome [facility])    Past Surgical History:  Procedure Laterality Date  . APPENDECTOMY    . DILATION AND CURETTAGE OF UTERUS  03/24/10  . PACEMAKER PLACEMENT     most recent generator replaced 2009 by Dr Olevia Perches    FAMILY HISTORY Family History  Problem Relation Age of Onset  . Heart failure Mother   . Heart  attack Father   . Rectal cancer Daughter   . Colon cancer Daughter   . Parkinsonism Sister   . Pancreatic cancer Brother   . Pancreatic cancer Sister   . Brain cancer Brother   . Heart disease Brother   . Colon cancer Daughter   . Diabetes Sister   . Clotting disorder Sister   . Crohn's disease Sister        twin  . Lung cancer Sister        twin    SOCIAL HISTORY Social History   Tobacco Use  . Smoking status: Former Smoker    Packs/day: 0.50    Years: 10.00    Pack years: 5.00    Types: Cigarettes    Last attempt to quit: 05/12/1987    Years since quitting: 31.1  . Smokeless tobacco: Never Used  Substance Use Topics  . Alcohol use: No    Comment: wine occasionally  . Drug use: No          OPHTHALMIC EXAM:  Base Eye Exam    Visual Acuity (Snellen - Linear)      Right Left   Dist [place] 20/20 -2 20/50   Dist ph [place]  NI       Tonometry (Tonopen, 2:38 PM)      Right Left   Pressure 15 14       Pupils      Dark Light Shape React APD   Right 3 2 Round Brisk None   Left 3 2 Round Brisk None       Visual Fields (Counting fingers)      Left Right    Full Full       Extraocular Movement      Right Left    Full, Ortho Full, Ortho       Neuro/Psych    Oriented x3:  Yes   Mood/Affect:  Normal       Dilation    Both eyes:  1.0% Mydriacyl, 2.5% Phenylephrine @ 2:38 PM        Slit Lamp and Fundus Exam    Slit Lamp Exam      Right Left   Lids/Lashes Dermatochalasis - upper lid, Meibomian gland dysfunction Dermatochalasis - upper lid, Meibomian gland dysfunction   Conjunctiva/Sclera White and quiet White and quiet   Cornea Arcus, crocodile shegreen, mild EBMD Arcus, 2+ endo pigment, mild EBMD   Anterior Chamber deep and clear deep and clear   Iris Round and dilated Round and poorly dilated to 37mm   Lens PC IOL in good position, trace Posterior capsular opacification ST PC IOL in good position   Vitreous Vitreous syneresis Vitreous syneresis       Fundus Exam      Right Left   Disc Compact Compact, +CNV with exudate ST disc -- mild improvement   C/D Ratio 0.1 0.2   Macula Blunted foveal reflex, Drusen, Retinal pigment epithelial mottling, No heme or edema Blunted foveal reflex, +edema and exudate SN macula -- mild improvement   Vessels Vascular attenuation, Tortuous, AV crossing changes Vascular attenuation, Tortuous, AV crossing changes   Periphery Attached, mild reticular degeneration, mild peripheral drusen attached; mild reticular degeneration          IMAGING AND PROCEDURES  Imaging and Procedures for @TODAY @  OCT, Retina - OU - Both Eyes       Right Eye Quality was good. Central Foveal Thickness: 300. Progression has been stable.  Findings include normal foveal contour,  no SRF, no IRF, retinal drusen , pigment epithelial detachment.   Left Eye Quality was good. Central Foveal Thickness: 313. Progression has improved. Findings include subretinal fluid, subretinal hyper-reflective material, intraretinal hyper-reflective material, outer retinal atrophy, intraretinal fluid, normal foveal contour (Interval improvement in SRF).   Notes *Images captured and stored on drive  Diagnosis / Impression:  OD: non-exu ARMD with central PED v vitelliform like lesion OS: peripapillary CNV with interval improvement in SRF/IRF    Clinical management:  See below  Abbreviations: NFP - Normal foveal profile. CME - cystoid macular edema. PED - pigment epithelial detachment. IRF - intraretinal fluid. SRF - subretinal fluid. EZ - ellipsoid zone. ERM - epiretinal membrane. ORA - outer retinal atrophy. ORT - outer retinal tubulation. SRHM - subretinal hyper-reflective material        Intravitreal Injection, Pharmacologic Agent - OS - Left Eye       Time Out 06/20/2018. 4:15 PM. Confirmed correct patient, procedure, site, and patient consented.   Anesthesia Topical anesthesia was used. Anesthetic medications included Lidocaine 2%, Proparacaine 0.5%.   Procedure Preparation included 5% betadine to ocular surface, eyelid speculum. A supplied needle was used.   Injection:  1.25 mg Bevacizumab (AVASTIN) SOLN   NDC: 31540-086-76, Lot: [id number] , Expiration date: 07/04/2018   Route: Intravitreal, Site: Left Eye, Waste: 0 mL  Post-op Post injection exam found visual acuity of at least counting fingers. The patient tolerated the procedure well. There were no complications. The patient received written and verbal post procedure care education.                 ASSESSMENT/PLAN:    ICD-10-CM   1. Exudative age-related macular degeneration of left eye with active choroidal neovascularization (HCC) H35.3221 Intravitreal  Injection, Pharmacologic Agent - OS - Left Eye    Bevacizumab (AVASTIN) SOLN 1.25 mg  2. 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 Retinal edema H35.81 OCT, Retina - OU - Both Eyes  4. Intermediate stage nonexudative age-related macular degeneration of right eye H35.3112   5. Pseudophakia of both eyes Z96.1     1-3. Exudative age related macular degeneration w/ peripapillary CNV, OS  - former pt of Dr. 07/17/2018 and Dr. Starling Manns   - hx of IVA treatments x4 with Dr. Ricki Miller - last injection 12/16  - The incidence pathology and anatomy of wet AMD discussed   - The ANCHOR, MARINA, CATT and VIEW trials discussed with patient.    - discussed treatment options including observation vs intravitreal anti-VEGF agents such as Avastin, Lucentis, Eylea.    - Risks of endophthalmitis and vascular occlusive events and atrophic changes discussed with patient  - s/p IVA OS #5 (01.13.20).  - OCT shows stable peripapillary CNV with interval improvement in SRF/IRF  - BCVA stable at 20/50  - recommend IVA #6 OS today, 02.10.20  - pt wishes to be treated with IVA  - RBA of procedure discussed, questions answered  - informed consent obtained and signed  - see procedure note  - f/u in 4 wks  4. Age related macular degeneration, non-exudative, OD  - The incidence, anatomy, and pathology of dry AMD, risk of progression, and the AREDS and AREDS 2 study including smoking risks discussed with patient.  - Recommend amsler grid monitoring  5. Pseudophakia OU  - s/p CE/IOL  - beautiful surgery, doing well  - pt unsure who the surgeon was  - monitor    Ophthalmic Meds Ordered this visit:  Meds ordered  this encounter  Medications  . Bevacizumab (AVASTIN) SOLN 1.25 mg       Return in about 4 weeks (around 07/18/2018) for f/u exu ARMD OS, DFE, OCT.  There are no Patient Instructions on file for this visit.   Explained the diagnoses, plan, and follow up with the patient and they  expressed understanding.  Patient expressed understanding of the importance of proper follow up care.   This document serves as a record of services personally performed by Gardiner Sleeper, MD, PhD. It was created on their behalf by Ernest Mallick, OA, an ophthalmic assistant. The creation of this record is the provider's dictation and/or activities during the visit.    Electronically signed by: Ernest Mallick, OA  02.05.2020 11:05 PM    Gardiner Sleeper, M.D., Ph.D. Diseases & Surgery of the Retina and Vitreous [facility]  I have reviewed the above documentation for accuracy and completeness, and I agree with the above. Gardiner Sleeper, M.D., Ph.D. 06/20/18 11:07 PM    Abbreviations: M myopia (nearsighted); A astigmatism; H hyperopia (farsighted); P presbyopia; Mrx spectacle prescription;  CTL contact lenses; OD right eye; OS left eye; OU both eyes  XT exotropia; ET esotropia; PEK punctate epithelial keratitis; PEE punctate epithelial erosions; DES dry eye syndrome; MGD meibomian gland dysfunction; ATs artificial tears; PFAT's preservative free artificial tears; [place] nuclear sclerotic cataract; PSC posterior subcapsular cataract; ERM epi-retinal membrane; PVD posterior vitreous detachment; RD retinal detachment; DM diabetes mellitus; DR diabetic retinopathy; NPDR non-proliferative diabetic retinopathy; PDR proliferative diabetic retinopathy; CSME clinically significant macular edema; DME diabetic macular edema; dbh dot blot hemorrhages; CWS cotton wool spot; POAG primary open angle glaucoma; C/D cup-to-disc ratio; HVF humphrey visual field; GVF goldmann visual field; OCT optical coherence tomography; IOP intraocular pressure; BRVO Branch retinal vein occlusion; CRVO central retinal vein occlusion; CRAO central retinal artery occlusion; BRAO branch retinal artery occlusion; RT retinal tear; SB scleral buckle; PPV pars plana vitrectomy; VH Vitreous hemorrhage; PRP panretinal laser  photocoagulation; IVK intravitreal kenalog; VMT vitreomacular traction; MH Macular hole;  NVD neovascularization of the disc; NVE neovascularization elsewhere; AREDS age related eye disease study; ARMD age related macular degeneration; POAG primary open angle glaucoma; EBMD epithelial/anterior basement membrane dystrophy; ACIOL anterior chamber intraocular lens; IOL intraocular lens; PCIOL posterior chamber intraocular lens; Phaco/IOL phacoemulsification with intraocular lens placement; [place] photorefractive keratectomy; LASIK laser assisted in situ keratomileusis; HTN hypertension; DM diabetes mellitus; COPD chronic obstructive pulmonary disease

## 2018-06-20 ENCOUNTER — Encounter (INDEPENDENT_AMBULATORY_CARE_PROVIDER_SITE_OTHER): Payer: Self-pay | Admitting: Ophthalmology

## 2018-06-20 ENCOUNTER — Ambulatory Visit (INDEPENDENT_AMBULATORY_CARE_PROVIDER_SITE_OTHER): Payer: Medicare Other | Admitting: Ophthalmology

## 2018-06-20 DIAGNOSIS — H35052 Retinal neovascularization, unspecified, left eye: Secondary | ICD-10-CM

## 2018-06-20 DIAGNOSIS — H353221 Exudative age-related macular degeneration, left eye, with active choroidal neovascularization: Secondary | ICD-10-CM | POA: Diagnosis not present

## 2018-06-20 DIAGNOSIS — H353112 Nonexudative age-related macular degeneration, right eye, intermediate dry stage: Secondary | ICD-10-CM

## 2018-06-20 DIAGNOSIS — Z961 Presence of intraocular lens: Secondary | ICD-10-CM

## 2018-06-20 DIAGNOSIS — H3581 Retinal edema: Secondary | ICD-10-CM | POA: Diagnosis not present

## 2018-06-20 DIAGNOSIS — IMO0001 Reserved for inherently not codable concepts without codable children: Secondary | ICD-10-CM

## 2018-06-20 MED ORDER — BEVACIZUMAB CHEMO INJECTION 1.25MG/0.05ML SYRINGE FOR KALEIDOSCOPE
1.2500 mg | INTRAVITREAL | Status: AC
  Administered 2018-06-20: 1.25 mg via INTRAVITREAL

## 2018-07-14 NOTE — Progress Notes (Signed)
[facility] Note  07/18/2018     CHIEF COMPLAINT Patient presents for Retina Follow Up   HISTORY OF PRESENT ILLNESS: [name] is a 85 y.o. female who presents to the clinic today for:   HPI    Retina Follow Up    Patient presents with  Wet AMD.  In left eye.  This started 4 weeks ago.  Severity is moderate.  Duration of 4 weeks.  Since onset it is stable.  I, the attending physician,  performed the HPI with the patient and updated documentation appropriately.          Comments    Patient here for 4 weeks retina follow up for Exu ARMD OS. Patient states vision doing ok. No eye pain.       Last edited by [name], MD on 07/18/2018  2:20 PM. (History)    pt states she is doing well  Referring physician: [name], MD [place], [place] [address]  HISTORICAL INFORMATION:   Selected notes from the MEDICAL RECORD NUMBER Referred by Dr. Luberta Mutter for concern of CNV OS LEE: 11.25.19 ([facility]. [facility]) Ocular Hx-previously seen by Dr. Starling Manns and received 4 IVA treatments, last one December 2016, cataracts PMH-alzheimers, anemia, aniety, depression, HLD    CURRENT MEDICATIONS: Current Outpatient Medications (Ophthalmic Drugs)  Medication Sig  . ketorolac (ACULAR) 0.5 % ophthalmic solution Place 1 drop into the left eye 4 (four) times daily.  . prednisoLONE acetate (PRED FORTE) 1 % ophthalmic suspension Place 1 drop into the left eye 4 (four) times daily.   No current facility-administered medications for this visit.  (Ophthalmic Drugs)   Current Outpatient Medications (Other)  Medication Sig  . FLUoxetine (PROZAC) 40 MG capsule Take 1 capsule (40 mg total) by mouth daily.  . memantine (NAMENDA) 10 MG tablet Take 1 tablet (10 mg total) by mouth 2 (two) times daily.   Current Facility-Administered Medications (Other)  Medication Route  . Bevacizumab (AVASTIN) SOLN 1.25 mg Intravitreal  . Bevacizumab (AVASTIN) SOLN  1.25 mg Intravitreal      REVIEW OF SYSTEMS: ROS    Positive for: Gastrointestinal, Neurological, Musculoskeletal, Eyes   Negative for: Constitutional, Skin, Genitourinary, HENT, Endocrine, Cardiovascular, Respiratory, Psychiatric, Allergic/Imm, Heme/Lymph   Last edited by Theodore Demark on 07/18/2018  1:37 PM. (History)       ALLERGIES Allergies  Allergen Reactions  . Aricept [Donepezil Hcl]     Diarrhea  . Exelon [Rivastigmine Tartrate]     Diarrhea    PAST MEDICAL HISTORY Past Medical History:  Diagnosis Date  . Allergic rhinitis   . Alzheimer disease ([place]) 01/15/2017  . Anemia   . Anxiety   . Anxiety and depression   . Cataract   . Depression   . Diverticulosis of colon   . DJD (degenerative joint disease)   . Gastric polyps   . GERD (gastroesophageal reflux disease)   . Hearing loss   . Hiatal hernia   . History of pericarditis   . Hypercholesteremia   . IBS (irritable bowel syndrome)   . Lumbar back pain   . Memory loss   . Monoclonal gammopathy   . Osteoporosis    osteopenia  . Presence of permanent cardiac pacemaker   . Sick sinus syndrome [facility])    Past Surgical History:  Procedure Laterality Date  . APPENDECTOMY    . DILATION AND CURETTAGE OF UTERUS  03/24/10  . PACEMAKER PLACEMENT  most recent generator replaced 2009 by Dr Olevia Perches    FAMILY HISTORY Family History  Problem Relation Age of Onset  . Heart failure Mother   . Heart attack Father   . Rectal cancer Daughter   . Colon cancer Daughter   . Parkinsonism Sister   . Pancreatic cancer Brother   . Pancreatic cancer Sister   . Brain cancer Brother   . Heart disease Brother   . Colon cancer Daughter   . Diabetes Sister   . Clotting disorder Sister   . Crohn's disease Sister        twin  . Lung cancer Sister        twin    SOCIAL HISTORY Social History   Tobacco Use  . Smoking status: Former Smoker    Packs/day: 0.50    Years: 10.00    Pack years: 5.00    Types:  Cigarettes    Last attempt to quit: 05/12/1987    Years since quitting: 31.2  . Smokeless tobacco: Never Used  Substance Use Topics  . Alcohol use: No    Comment: wine occasionally  . Drug use: No         OPHTHALMIC EXAM:  Base Eye Exam    Visual Acuity (Snellen - Linear)      Right Left   Dist [place] 20/25 -2 20/50 -2   Dist ph  20/25 +1 NI       Tonometry (Tonopen, 1:34 PM)      Right Left   Pressure 12 13       Pupils      Dark Light Shape React APD   Right 3 2 Round Brisk None   Left 3 2 Round Brisk None       Visual Fields (Counting fingers)      Left Right    Full Full       Extraocular Movement      Right Left    Full, Ortho Full, Ortho       Neuro/Psych    Oriented x3:  Yes   Mood/Affect:  Normal       Dilation    Both eyes:  1.0% Mydriacyl, 2.5% Phenylephrine @ 1:34 PM        Slit Lamp and Fundus Exam    Slit Lamp Exam      Right Left   Lids/Lashes Dermatochalasis - upper lid, Meibomian gland dysfunction Dermatochalasis - upper lid, Meibomian gland dysfunction   Conjunctiva/Sclera White and quiet White and quiet   Cornea Arcus, crocodile shegreen, mild EBMD, 2+endo pigment Arcus, 2+ endo pigment, mild EBMD   Anterior Chamber deep and clear deep and clear, 1/2+pigment/cell   Iris Round and dilated Round and poorly dilated to 56mm   Lens PC IOL in good position, trace Posterior capsular opacification ST PC IOL in good position   Vitreous Vitreous syneresis Vitreous syneresis       Fundus Exam      Right Left   Disc Compact Compact, +CNV with exudate ST disc -- mild improvement   C/D Ratio 0.1 0.2   Macula Blunted foveal reflex, Drusen, Retinal pigment epithelial mottling, No heme or edema, ?early atrophy Peripapillary exudates and edema, Blunted foveal reflex, +cystic changes centrally   Vessels Vascular attenuation, Tortuous, AV crossing changes Vascular attenuation, Tortuous, AV crossing changes   Periphery Attached, mild reticular  degeneration, mild peripheral drusen attached; mild reticular degeneration          IMAGING AND PROCEDURES  Imaging  and Procedures for @TODAY @  OCT, Retina - OU - Both Eyes       Right Eye Quality was good. Central Foveal Thickness: 297. Progression has been stable. Findings include normal foveal contour, no SRF, no IRF, retinal drusen , pigment epithelial detachment.   Left Eye Quality was good. Central Foveal Thickness: 389. Progression has worsened. Findings include subretinal fluid, subretinal hyper-reflective material, intraretinal hyper-reflective material, outer retinal atrophy, intraretinal fluid, normal foveal contour (Relatively stable peripapillary CNV; Interval development of central CME).   Notes *Images captured and stored on drive  Diagnosis / Impression:  OD: non-exu ARMD with central PED v vitelliform like lesion [id number] stable peripapillary CNV; Interval development of central CME     Clinical management:  See below  Abbreviations: NFP - Normal foveal profile. CME - cystoid macular edema. PED - pigment epithelial detachment. IRF - intraretinal fluid. SRF - subretinal fluid. EZ - ellipsoid zone. ERM - epiretinal membrane. ORA - outer retinal atrophy. ORT - outer retinal tubulation. SRHM - subretinal hyper-reflective material        Intravitreal Injection, Pharmacologic Agent - OS - Left Eye       Time Out 07/18/2018. 2:47 PM. Confirmed correct patient, procedure, site, and patient consented.   Anesthesia Topical anesthesia was used. Anesthetic medications included Lidocaine 2%, Proparacaine 0.5%.   Procedure Preparation included 5% betadine to ocular surface, eyelid speculum. A supplied needle was used.   Injection:  1.25 mg Bevacizumab (AVASTIN) SOLN   NDC: 81191-478-29, Lot: [id number] , Expiration date: 09/07/2018   Route: Intravitreal, Site: Left Eye, Waste: 0 mL  Post-op Post injection exam found visual acuity of at least counting  fingers. The patient tolerated the procedure well. There were no complications. The patient received written and verbal post procedure care education.                 ASSESSMENT/PLAN:    ICD-10-CM   1. Exudative age-related macular degeneration of left eye with active choroidal neovascularization (HCC) H35.3221 Intravitreal Injection, Pharmacologic Agent - OS - Left Eye  2. 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 Retinal edema H35.81 OCT, Retina - OU - Both Eyes  4. Intermediate stage nonexudative age-related macular degeneration of right eye H35.3112   5. Pseudophakia of both eyes Z96.1     1-3. Exudative age related macular degeneration w/ peripapillary CNV, OS  - former pt of Dr. 09/20/2018 and Dr. Starling Manns   - hx of IVA treatments x4 with Dr. Ricki Miller - last injection 12/16  - The incidence pathology and anatomy of wet AMD discussed   - The ANCHOR, MARINA, CATT and VIEW trials discussed with patient.    - discussed treatment options including observation vs intravitreal anti-VEGF agents such as Avastin, Lucentis, Eylea.    - Risks of endophthalmitis and vascular occlusive events and atrophic changes discussed with patient  - s/p IVA OS #5 (01.13.20), #6 (02.10.20)  - OCT shows stable peripapillary CNV; interval formation of central CME  - BCVA stable at 20/50  - recommend IVA #7 OS today, 03.09.20 and start PF and Ketorolac QID OS  - pt wishes to be treated with IVA OS  - RBA of procedure discussed, questions answered  - informed consent obtained and signed  - see procedure note  - f/u in 4 wks  4. Age related macular degeneration, non-exudative, OD  - The incidence, anatomy, and pathology of dry AMD, risk of progression, and the AREDS and AREDS 2 study including smoking risks  discussed with patient.  - recommend amsler grid monitoring  5. Pseudophakia OU  - s/p CE/IOL  - beautiful surgery, doing well  - pt unsure who the surgeon was  -  monitor    Ophthalmic Meds Ordered this visit:  Meds ordered this encounter  Medications  . prednisoLONE acetate (PRED FORTE) 1 % ophthalmic suspension    Sig: Place 1 drop into the left eye 4 (four) times daily.    Dispense:  10 mL    Refill:  0  . ketorolac (ACULAR) 0.5 % ophthalmic solution    Sig: Place 1 drop into the left eye 4 (four) times daily.    Dispense:  10 mL    Refill:  0       Return in about 4 weeks (around 08/15/2018) for Dilated Exam, OCT, Possible Injxn.  There are no Patient Instructions on file for this visit.   Explained the diagnoses, plan, and follow up with the patient and they expressed understanding.  Patient expressed understanding of the importance of proper follow up care.   This document serves as a record of services personally performed by Gardiner Sleeper, MD, PhD. It was created on their behalf by Ernest Mallick, OA, an ophthalmic assistant. The creation of this record is the provider's dictation and/or activities during the visit.    Electronically signed by: Ernest Mallick, OA  03.05.2020 12:53 AM     Gardiner Sleeper, M.D., Ph.D. Diseases & Surgery of the Retina and Vitreous [facility]  I have reviewed the above documentation for accuracy and completeness, and I agree with the above. Gardiner Sleeper, M.D., Ph.D. 07/19/18 12:55 AM     Abbreviations: M myopia (nearsighted); A astigmatism; H hyperopia (farsighted); P presbyopia; Mrx spectacle prescription;  CTL contact lenses; OD right eye; OS left eye; OU both eyes  XT exotropia; ET esotropia; PEK punctate epithelial keratitis; PEE punctate epithelial erosions; DES dry eye syndrome; MGD meibomian gland dysfunction; ATs artificial tears; PFAT's preservative free artificial tears; [place] nuclear sclerotic cataract; PSC posterior subcapsular cataract; ERM epi-retinal membrane; PVD posterior vitreous detachment; RD retinal detachment; DM diabetes mellitus; DR diabetic  retinopathy; NPDR non-proliferative diabetic retinopathy; PDR proliferative diabetic retinopathy; CSME clinically significant macular edema; DME diabetic macular edema; dbh dot blot hemorrhages; CWS cotton wool spot; POAG primary open angle glaucoma; C/D cup-to-disc ratio; HVF humphrey visual field; GVF goldmann visual field; OCT optical coherence tomography; IOP intraocular pressure; BRVO Branch retinal vein occlusion; CRVO central retinal vein occlusion; CRAO central retinal artery occlusion; BRAO branch retinal artery occlusion; RT retinal tear; SB scleral buckle; PPV pars plana vitrectomy; VH Vitreous hemorrhage; PRP panretinal laser photocoagulation; IVK intravitreal kenalog; VMT vitreomacular traction; MH Macular hole;  NVD neovascularization of the disc; NVE neovascularization elsewhere; AREDS age related eye disease study; ARMD age related macular degeneration; POAG primary open angle glaucoma; EBMD epithelial/anterior basement membrane dystrophy; ACIOL anterior chamber intraocular lens; IOL intraocular lens; PCIOL posterior chamber intraocular lens; Phaco/IOL phacoemulsification with intraocular lens placement; [place] photorefractive keratectomy; LASIK laser assisted in situ keratomileusis; HTN hypertension; DM diabetes mellitus; COPD chronic obstructive pulmonary disease

## 2018-07-18 ENCOUNTER — Ambulatory Visit (INDEPENDENT_AMBULATORY_CARE_PROVIDER_SITE_OTHER): Payer: Medicare Other | Admitting: Ophthalmology

## 2018-07-18 ENCOUNTER — Encounter (INDEPENDENT_AMBULATORY_CARE_PROVIDER_SITE_OTHER): Payer: Self-pay | Admitting: Ophthalmology

## 2018-07-18 DIAGNOSIS — H353112 Nonexudative age-related macular degeneration, right eye, intermediate dry stage: Secondary | ICD-10-CM | POA: Diagnosis not present

## 2018-07-18 DIAGNOSIS — Z961 Presence of intraocular lens: Secondary | ICD-10-CM

## 2018-07-18 DIAGNOSIS — H35052 Retinal neovascularization, unspecified, left eye: Secondary | ICD-10-CM

## 2018-07-18 DIAGNOSIS — H3581 Retinal edema: Secondary | ICD-10-CM | POA: Diagnosis not present

## 2018-07-18 DIAGNOSIS — IMO0001 Reserved for inherently not codable concepts without codable children: Secondary | ICD-10-CM

## 2018-07-18 DIAGNOSIS — H353221 Exudative age-related macular degeneration, left eye, with active choroidal neovascularization: Secondary | ICD-10-CM

## 2018-07-18 MED ORDER — KETOROLAC TROMETHAMINE 0.5 % OP SOLN: 1.0000 [drp] | Freq: Four times a day (QID) | OPHTHALMIC | 0 refills | Status: DC

## 2018-07-18 MED ORDER — PREDNISOLONE ACETATE 1 % OP SUSP: 1.0000 [drp] | Freq: Four times a day (QID) | OPHTHALMIC | 0 refills | Status: DC

## 2018-07-19 MED ORDER — BEVACIZUMAB CHEMO INJECTION 1.25MG/0.05ML SYRINGE FOR KALEIDOSCOPE
1.2500 mg | INTRAVITREAL | Status: AC
Start: 2018-07-19 — End: ?
  Administered 2018-07-19: 1.25 mg via INTRAVITREAL

## 2018-08-15 ENCOUNTER — Encounter (INDEPENDENT_AMBULATORY_CARE_PROVIDER_SITE_OTHER): Payer: Medicare Other | Admitting: Ophthalmology

## 2018-09-29 ENCOUNTER — Ambulatory Visit: Payer: Medicare Other | Admitting: Adult Health

## 2018-12-16 ENCOUNTER — Telehealth: Payer: Self-pay | Admitting: Adult Health

## 2018-12-16 NOTE — Telephone Encounter (Signed)
Pt's daughter on DPR called stating that being home bound is really starting to affect the pt and the daughter was wondering if the pt can be prescribed Ativan to keep her calm. Please advise.

## 2018-12-19 NOTE — Telephone Encounter (Signed)
Yes - PCP.

## 2018-12-19 NOTE — Telephone Encounter (Addendum)
LMVM for [name], that per MM/NP would have pt seen by pcp relating to anxiety.  She is to call back if questions.

## 2019-03-07 ENCOUNTER — Telehealth: Payer: Self-pay | Admitting: Neurology

## 2019-03-07 ENCOUNTER — Telehealth: Payer: Self-pay

## 2019-03-07 MED ORDER — MEMANTINE HCL 10 MG PO TABS: 10.0000 mg | ORAL_TABLET | Freq: Two times a day (BID) | ORAL | 0 refills | Status: DC

## 2019-03-07 MED ORDER — FLUOXETINE HCL 40 MG PO CAPS: 40.0000 mg | ORAL_CAPSULE | Freq: Every day | ORAL | 0 refills | Status: DC

## 2019-03-07 NOTE — Telephone Encounter (Signed)
[facility]  Just called to check on the status of the refill on both FLUoxetine (PROZAC) 40 MG capsule and memantine (NAMENDA) 10 MG tablet Later in call the pharmacist @ [facility]   Stated the 2 should have been sent to [facility]  [address] in [place], [place] ph [phone]

## 2019-03-07 NOTE — Telephone Encounter (Signed)
Pt is needing a refill and PA for her memantine (NAMENDA) 10 MG tablet sent to the [facility]

## 2019-03-07 NOTE — Telephone Encounter (Signed)
prescriptions for 30 days done on both memantine and fluoxetine.

## 2019-03-07 NOTE — Telephone Encounter (Signed)
Refill signed and faxed to [facility] [phone]. For 30 day refill on prozac. Relayed in fax that pt needs RV for future refills.  Received fax confirmation.

## 2019-03-07 NOTE — Telephone Encounter (Signed)
I called and spoke to pharmacy tech.  She stated that [facility] can be excribed with the medications.  I sent one for memantine #60 (30 days supply). I LMVM for daughter to call about making appt.

## 2019-03-07 NOTE — Telephone Encounter (Signed)
LMVM for susan, daughter, relating to pt being seen or VV or delaying appt until covid depending on how she is doing.  Needs refills on both fluoxetine and memantine.

## 2019-03-28 ENCOUNTER — Other Ambulatory Visit: Payer: Self-pay | Admitting: *Deleted

## 2019-03-28 ENCOUNTER — Telehealth: Payer: Self-pay | Admitting: Adult Health

## 2019-03-28 MED ORDER — FLUOXETINE HCL 40 MG PO CAPS: 40.0000 mg | ORAL_CAPSULE | Freq: Every day | ORAL | 2 refills | Status: DC

## 2019-03-28 MED ORDER — MEMANTINE HCL 10 MG PO TABS: 10.0000 mg | ORAL_TABLET | Freq: Two times a day (BID) | ORAL | 2 refills | Status: DC

## 2019-03-28 NOTE — Telephone Encounter (Signed)
I called pts daughter.  She stated that pt is doing well.   Pt is [facility] at [facility] and she does not want pt to go out due to covid.  Asking for another 3 months of meds at this time.  I relayed that after one yr we like some connection.  Can her caregiver that see's her in the AM twice weekly do a virtual visit.  She would check.  I went ahead and did this for her.  She will call back as needed.

## 2019-03-28 NOTE — Telephone Encounter (Signed)
Manuela Schwartz, pt's daughter on DPR called and LVM stated she wanting to speak the RN. Please advise.

## 2019-08-07 ENCOUNTER — Telehealth: Payer: Self-pay | Admitting: Adult Health

## 2019-08-07 MED ORDER — FLUOXETINE HCL 40 MG PO CAPS: 40.0000 mg | ORAL_CAPSULE | Freq: Every day | ORAL | 2 refills | Status: AC

## 2019-08-07 MED ORDER — MEMANTINE HCL 10 MG PO TABS: 10.0000 mg | ORAL_TABLET | Freq: Two times a day (BID) | ORAL | 2 refills | Status: DC

## 2019-08-07 NOTE — Telephone Encounter (Signed)
1) Medication(s) Requested (by name): memantine (NAMENDA) 10 MG tablet  FLUoxetine (PROZAC) 40 MG capsule   2) Pharmacy of Choice: [facility] - [place], [place] - [address]  [address] [place] [address]

## 2019-09-11 ENCOUNTER — Ambulatory Visit: Payer: Medicare Other | Admitting: Adult Health

## 2019-11-23 ENCOUNTER — Ambulatory Visit: Payer: Medicare Other | Admitting: Adult Health

## 2019-11-23 VITALS — BP 119/74 | HR 77 | Ht 64.0 in | Wt 165.0 lb

## 2019-11-23 DIAGNOSIS — R413 Other amnesia: Secondary | ICD-10-CM | POA: Diagnosis not present

## 2019-11-23 MED ORDER — MEMANTINE HCL ER 28 MG PO CP24: 28.0000 mg | ORAL_CAPSULE | Freq: Every day | ORAL | 5 refills | Status: DC

## 2019-11-23 NOTE — Progress Notes (Signed)
PATIENT: [name] DOB: 16-Sep-1928  REASON FOR VISIT: follow up HISTORY FROM: patient  HISTORY OF PRESENT ILLNESS: Today 11/23/19:  [name] is an 86 year old female with a history of memory disturbance.  She returns today for follow-up.  She is here today with her daughter.  She continues to live at [facility].  She is able to complete all ADLs independently.  She does have an aide that comes twice a week to help her with chores.  The daughter does feel that she is missing the second dose of Namenda.  She denies any trouble sleeping.  She no longer operates a motor vehicle.  She returns today for evaluation.  HISTORY 01/18/18: [name] is an 86 year old female with a history of progressive memory disturbance.  She returns today for follow-up.  She feels that her memory has remained stable.  She is able to complete all ADLs independently.  She continues to reside at [facility].  She has a caregiver that comes in 3 mornings a week to offer assistance if needed.  The patient reports that she is sleeping well.  Denies any changes in her mood or behavior.  Reports good appetite.  Her daughter manages her appointments.  Her aide helps her with her medications.  She remains on Namenda and Prozac.  He returns today for evaluation.   REVIEW OF SYSTEMS: Out of a complete 14 system review of symptoms, the patient complains only of the following symptoms, and all other reviewed systems are negative.  See HPI  ALLERGIES: Allergies  Allergen Reactions  . Aricept [Donepezil Hcl]     Diarrhea  . Exelon [Rivastigmine Tartrate]     Diarrhea    HOME MEDICATIONS: Outpatient Medications Prior to Visit  Medication Sig Dispense Refill  . FLUoxetine (PROZAC) 40 MG capsule Take 1 capsule (40 mg total) by mouth daily. 30 capsule 2  . memantine (NAMENDA) 10 MG tablet Take 1 tablet (10 mg total) by mouth 2 (two) times daily. 60 tablet 2  . ketorolac (ACULAR) 0.5 % ophthalmic solution Place 1  drop into the left eye 4 (four) times daily. 10 mL 0  . prednisoLONE acetate (PRED FORTE) 1 % ophthalmic suspension Place 1 drop into the left eye 4 (four) times daily. 10 mL 0   Facility-Administered Medications Prior to Visit  Medication Dose Route Frequency Provider Last Rate Last Admin  . Bevacizumab (AVASTIN) SOLN 1.25 mg  1.25 mg Intravitreal  [name], MD   1.25 mg at 05/24/18 1249  . Bevacizumab (AVASTIN) SOLN 1.25 mg  1.25 mg Intravitreal  [name], MD   1.25 mg at 06/20/18 2305  . Bevacizumab (AVASTIN) SOLN 1.25 mg  1.25 mg Intravitreal  [name], MD   1.25 mg at 07/19/18 0053    PAST MEDICAL HISTORY: Past Medical History:  Diagnosis Date  . Allergic rhinitis   . Alzheimer disease ([place]) 01/15/2017  . Anemia   . Anxiety   . Anxiety and depression   . Cataract   . Depression   . Diverticulosis of colon   . DJD (degenerative joint disease)   . Gastric polyps   . GERD (gastroesophageal reflux disease)   . Hearing loss   . Hiatal hernia   . History of pericarditis   . Hypercholesteremia   . IBS (irritable bowel syndrome)   . Lumbar back pain   . Memory loss   . Monoclonal gammopathy   . Osteoporosis    osteopenia  . Presence of permanent cardiac  pacemaker   . Sick sinus syndrome ([place])     PAST SURGICAL HISTORY: Past Surgical History:  Procedure Laterality Date  . APPENDECTOMY    . DILATION AND CURETTAGE OF UTERUS  03/24/10  . PACEMAKER PLACEMENT     most recent generator replaced 2009 by Dr Olevia Perches    FAMILY HISTORY: Family History  Problem Relation Age of Onset  . Heart failure Mother   . Heart attack Father   . Rectal cancer Daughter   . Colon cancer Daughter   . Parkinsonism Sister   . Pancreatic cancer Brother   . Pancreatic cancer Sister   . Brain cancer Brother   . Heart disease Brother   . Colon cancer Daughter   . Diabetes Sister   . Clotting disorder Sister   . Crohn's disease Sister        twin  . Lung cancer Sister         twin    SOCIAL HISTORY: Social History   Socioeconomic History  . Marital status: Widowed    Spouse name: Not on file  . Number of children: 3  . Years of education: 44  . Highest education level: Not on file  Occupational History    Employer: RETIRED    Comment: retired  Tobacco Use  . Smoking status: Former Smoker    Packs/day: 0.50    Years: 10.00    Pack years: 5.00    Types: Cigarettes    Quit date: 05/12/1987    Years since quitting: 32.5  . Smokeless tobacco: Never Used  Vaping Use  . Vaping Use: Never used  Substance and Sexual Activity  . Alcohol use: No    Comment: wine occasionally  . Drug use: No  . Sexual activity: Not on file  Other Topics Concern  . Not on file  Social History Narrative   Pt has 12 siblings.   Alcohol use- no   Drug use- no   Daily caffeine use-1-2 cup      Patient is a widow.   Retired   [employer] school education.      Right handed         Social Determinants of Health   Financial Resource Strain:   . Difficulty of Paying Living Expenses:   Food Insecurity:   . Worried About Charity fundraiser in the Last Year:   . Arboriculturist in the Last Year:   Transportation Needs:   . Film/video editor (Medical):   Marland Kitchen Lack of Transportation (Non-Medical):   Physical Activity:   . Days of Exercise per Week:   . Minutes of Exercise per Session:   Stress:   . Feeling of Stress :   Social Connections:   . Frequency of Communication with Friends and Family:   . Frequency of Social Gatherings with Friends and Family:   . Attends Religious Services:   . Active Member of Clubs or Organizations:   . Attends Archivist Meetings:   Marland Kitchen Marital Status:   Intimate Partner Violence:   . Fear of Current or Ex-Partner:   . Emotionally Abused:   Marland Kitchen Physically Abused:   . Sexually Abused:       PHYSICAL EXAM  Vitals:   11/23/19 1420  BP: 119/74  Pulse: 77  Weight: 165 lb (74.8 kg)  Height: 5' 4"  (1.626 m)   Body mass  index is 28.32 kg/m.   MMSE - Mini Mental State Exam 11/23/2019 01/18/2018 07/13/2017  Orientation  to time 4 3 3   Orientation to Place 3 5 3   Registration 3 3 3   Attention/ Calculation 2 5 2   Recall 2 2 2   Language- name 2 objects 2 2 2   Language- repeat 1 1 1   Language- follow 3 step command 3 3 3   Language- read & follow direction 1 1 1   Write a sentence 1 1 1   Copy design 0 1 1  Copy design-comments 4 animals - -  Total score 22 27 22      Generalized: Well developed, in no acute distress   Neurological examination  Mentation: Alert oriented to time, place, history taking. Follows all commands speech and language fluent Cranial nerve II-XII: Pupils were equal round reactive to light. Extraocular movements were full, visual field were full on confrontational test. Facial sensation and strength were normal. Uvula tongue midline. Head turning and shoulder shrug  were normal and symmetric. Motor: The motor testing reveals 5 over 5 strength of all 4 extremities. Good symmetric motor tone is noted throughout.  Sensory: Sensory testing is intact to soft touch on all 4 extremities. No evidence of extinction is noted.  Coordination: Cerebellar testing reveals good finger-nose-finger and heel-to-shin bilaterally.  Gait and station: Gait is slightly wide-based.  Tandem gait not attempted. Reflexes: Deep tendon reflexes are symmetric and normal bilaterally.   DIAGNOSTIC DATA (LABS, IMAGING, TESTING) - I reviewed patient records, labs, notes, testing and imaging myself where available.  Lab Results  Component Value Date   WBC 6.6 10/23/2013   HGB 13.6 10/23/2013   HCT 41.0 10/23/2013   MCV 90.6 10/23/2013   PLT 213.0 10/23/2013      Component Value Date/Time   NA 140 10/23/2013 0959   K 4.2 10/23/2013 0959   CL 105 10/23/2013 0959   CO2 28 10/23/2013 0959   GLUCOSE 117 (H) 10/23/2013 0959   BUN 16 10/23/2013 0959   CREATININE 0.9 10/23/2013 0959   CALCIUM 9.2 10/23/2013 0959    PROT 7.1 10/23/2013 0959   ALBUMIN 4.0 10/23/2013 0959   AST 21 10/23/2013 0959   ALT 15 10/23/2013 0959   ALKPHOS 71 10/23/2013 0959   BILITOT 0.3 10/23/2013 0959   GFRNONAA >60 06/13/2009 0431   GFRAA  06/13/2009 0431    >60        The eGFR has been calculated using the MDRD equation. This calculation has not been validated in all clinical situations. eGFR's persistently <60 mL/min signify possible Chronic Kidney Disease.   Lab Results  Component Value Date   CHOL 295 (H) 10/23/2013   HDL 69.00 10/23/2013   LDLCALC 204 (H) 10/23/2013   LDLDIRECT 143.7 04/14/2011   TRIG 109.0 10/23/2013   CHOLHDL 4 10/23/2013   Lab Results  Component Value Date   HGBA1C (H) 06/10/2009    6.2 (NOTE) The ADA recommends the following therapeutic goal for glycemic control related to Hgb A1c measurement: Goal of therapy: <6.5 Hgb A1c  Reference: American Diabetes Association: Clinical Practice Recommendations 2010, Diabetes Care, 2010, 33: (Suppl  1).   Lab Results  Component Value Date   VITAMINB12 420 08/21/2010   Lab Results  Component Value Date   TSH 0.68 10/23/2013      ASSESSMENT AND PLAN 86 y.o. year old female  has a past medical history of Allergic rhinitis, Alzheimer disease ([place]) (01/15/2017), Anemia, Anxiety, Anxiety and depression, Cataract, Depression, Diverticulosis of colon, DJD (degenerative joint disease), Gastric polyps, GERD (gastroesophageal reflux disease), Hearing loss, Hiatal hernia, History of pericarditis, Hypercholesteremia,  IBS (irritable bowel syndrome), Lumbar back pain, Memory loss, Monoclonal gammopathy, Osteoporosis, Presence of permanent cardiac pacemaker, and Sick sinus syndrome ([place]). here with :  1.  Dementia   We will change Namenda to long-acting form  Memory score stable  Advised if symptoms worsen or she develops new symptoms she should let us know  Follow-up in 6 months or sooner if needed  I spent 20 minutes of face-to-face and  non-face-to-face time with patient.  This included previsit chart review, lab review, study review, order entry, electronic health record documentation, patient education.  Ward Givens, MSN, NP-C 11/23/2019, 2:45 PM [facility] [address] [place], [place] [address] [phone]

## 2019-11-23 NOTE — Patient Instructions (Signed)
Your Plan:  namenda switched to long acting Memory score is stable If your symptoms worsen or you develop new symptoms please let us know.     Thank you for coming to see [place] at [facility]. I hope we have been able to provide you high quality care today.  You may receive a patient satisfaction survey over the next few weeks. We would appreciate your feedback and comments so that we may continue to improve ourselves and the health of our patients.

## 2019-11-24 NOTE — Progress Notes (Signed)
I have read the note, and I agree with the clinical assessment and plan.  Christin Moline K Juwuan Sedita   

## 2020-05-23 ENCOUNTER — Telehealth: Payer: Self-pay | Admitting: Adult Health

## 2020-05-23 NOTE — Telephone Encounter (Signed)
I called spoke to [place].  New pharmcy management for pt.  She wanted to confirm about memantine 10mg  tabs.  I called her that I have note about namenda XR 28mg  po has for pt from last 11-23-19.  Was approved cost higher.   She will check with daughter and let us know if need another refill for 10mg  po bid of memantine or will use prescription they have for memantine XR 28mg  po qhs. appreciated call back.  [place] if now being used for pt.

## 2020-05-23 NOTE — Telephone Encounter (Signed)
Kaley(pharmacist) @ [facility] has called for a refill of memantine (NAMENDA) 10 MG tablet to [place] purposes she is under [employer])

## 2020-05-27 ENCOUNTER — Ambulatory Visit: Payer: Medicare Other | Admitting: Adult Health

## 2020-06-11 ENCOUNTER — Other Ambulatory Visit: Payer: Self-pay

## 2020-06-11 ENCOUNTER — Ambulatory Visit: Payer: Medicare Other | Admitting: Adult Health

## 2020-06-11 ENCOUNTER — Encounter: Payer: Self-pay | Admitting: Adult Health

## 2020-06-11 VITALS — BP 134/81 | HR 92 | Ht 64.0 in | Wt 167.0 lb

## 2020-06-11 DIAGNOSIS — R413 Other amnesia: Secondary | ICD-10-CM | POA: Diagnosis not present

## 2020-06-11 NOTE — Progress Notes (Signed)
PATIENT: [name] DOB: [birth year]  REASON FOR VISIT: follow up HISTORY FROM: patient  HISTORY OF PRESENT ILLNESS: [name] 06/11/20:  [name] is an 87 year old female with a history of memory disturbance.  She returns [name] for follow-up.  She feels that her memory has remained stable.  She continues to reside at [facility].  She is here [name] with her daughter.  Her daughter reports they now have a nurse that comes every morning to help with her medication.  They have a CMA that comes twice a week to help her shower and help with household chores.  The daughter notes that her mom has become very reclusive.  She does not like to come out of her home very much.  She does not necessarily feel that she is depressed.  She remains on Prozac 40 mg daily.  She continues on Namenda XR 28 mg daily.  She returns [name] for an evaluation.  7/15/21Ms. [name] is an 87 year old female with a history of memory disturbance.  She returns [name] for follow-up.  She is here [name] with her daughter.  She continues to live at [facility].  She is able to complete all ADLs independently.  She does have an aide that comes twice a week to help her with chores.  The daughter does feel that she is missing the second dose of Namenda.  She denies any trouble sleeping.  She no longer operates a motor vehicle.  She returns [name] for evaluation.  HISTORY 01/18/18: [name] is an 87 year old female with a history of progressive memory disturbance.  She returns [name] for follow-up.  She feels that her memory has remained stable.  She is able to complete all ADLs independently.  She continues to reside at [facility].  She has a caregiver that comes in 3 mornings a week to offer assistance if needed.  The patient reports that she is sleeping well.  Denies any changes in her mood or behavior.  Reports good appetite.  Her daughter manages her appointments.  Her aide helps her with her medications.  She remains on Namenda and  Prozac.  [name] for evaluation.   REVIEW OF SYSTEMS: Out of a complete 14 system review of symptoms, the patient complains only of the following symptoms, and all other reviewed systems are negative.  See HPI  ALLERGIES: Allergies  Allergen Reactions  . Aricept [Donepezil Hcl]     Diarrhea  . Exelon [Rivastigmine Tartrate]     Diarrhea    HOME MEDICATIONS: Outpatient Medications Prior to Visit  Medication Sig Dispense Refill  . FLUoxetine (PROZAC) 40 MG capsule Take 1 capsule (40 mg total) by mouth daily. 30 capsule 2  . memantine (NAMENDA XR) 28 MG CP24 24 hr capsule Take 1 capsule (28 mg total) by mouth daily. 30 capsule 5   Facility-Administered Medications Prior to Visit  Medication Dose Route Frequency Provider Last Rate Last Admin  . Bevacizumab (AVASTIN) SOLN 1.25 mg  1.25 mg Intravitreal  [name], MD   1.25 mg at 05/24/18 1249  . Bevacizumab (AVASTIN) SOLN 1.25 mg  1.25 mg Intravitreal  [name], MD   1.25 mg at 06/20/18 2305  . Bevacizumab (AVASTIN) SOLN 1.25 mg  1.25 mg Intravitreal  [name], MD   1.25 mg at 07/19/18 0053    PAST MEDICAL HISTORY: Past Medical History:  Diagnosis Date  . Allergic rhinitis   . Alzheimer disease ([place]) 01/15/2017  . Anemia   . Anxiety   .  Anxiety and depression   . Cataract   . Depression   . Diverticulosis of colon   . DJD (degenerative joint disease)   . Gastric polyps   . GERD (gastroesophageal reflux disease)   . Hearing loss   . Hiatal hernia   . History of pericarditis   . Hypercholesteremia   . IBS (irritable bowel syndrome)   . Lumbar back pain   . Memory loss   . Monoclonal gammopathy   . Osteoporosis    osteopenia  . Presence of permanent cardiac pacemaker   . Sick sinus syndrome ([place])     PAST SURGICAL HISTORY: Past Surgical History:  Procedure Laterality Date  . APPENDECTOMY    . DILATION AND CURETTAGE OF UTERUS  03/24/10  . PACEMAKER PLACEMENT     most recent generator  replaced 2009 by Dr Olevia Perches    FAMILY HISTORY: Family History  Problem Relation Age of Onset  . Heart failure Mother   . Heart attack Father   . Rectal cancer Daughter   . Colon cancer Daughter   . Parkinsonism Sister   . Pancreatic cancer Brother   . Pancreatic cancer Sister   . Brain cancer Brother   . Heart disease Brother   . Colon cancer Daughter   . Diabetes Sister   . Clotting disorder Sister   . Crohn's disease Sister        twin  . Lung cancer Sister        twin    SOCIAL HISTORY: Social History   Socioeconomic History  . Marital status: Widowed    Spouse name: Not on file  . Number of children: 3  . Years of education: 58  . Highest education level: Not on file  Occupational History    Employer: RETIRED    Comment: retired  Tobacco Use  . Smoking status: Former Smoker    Packs/day: 0.50    Years: 10.00    Pack years: 5.00    Types: Cigarettes    Quit date: 05/12/1987    Years since quitting: 33.1  . Smokeless tobacco: Never Used  Vaping Use  . Vaping Use: Never used  Substance and Sexual Activity  . Alcohol use: No    Comment: wine occasionally  . Drug use: No  . Sexual activity: Not on file  Other Topics Concern  . Not on file  Social History Narrative   Pt has 12 siblings.   Alcohol use- no   Drug use- no   Daily caffeine use-1-2 cup      Patient is a widow.   Retired   [employer] school education.      Right handed         Social Determinants of Health   Financial Resource Strain: Not on file  Food Insecurity: Not on file  Transportation Needs: Not on file  Physical Activity: Not on file  Stress: Not on file  Social Connections: Not on file  Intimate Partner Violence: Not on file      PHYSICAL EXAM  Vitals:   06/11/20 1127  BP: 134/81  Pulse: 92  Weight: 167 lb (75.8 kg)  Height: 5' 4"  (1.626 m)   Body mass index is 28.67 kg/m.   MMSE - Mini Mental State Exam 06/11/2020 11/23/2019 01/18/2018  Orientation to time 1 4 3    Orientation to Place 4 3 5   Registration 3 3 3   Attention/ Calculation 2 2 5   Recall 2 2 2   Language- name 2 objects 2  2 2  Language- repeat 1 1 1   Language- follow 3 step command 3 3 3   Language- read & follow direction 1 1 1   Write a sentence 1 1 1   Copy design 1 0 1  Copy design-comments - 4 animals -  Total score 21 22 27      Generalized: Well developed, in no acute distress   Neurological examination  Mentation: Alert oriented to time, place, history taking. Follows all commands speech and language fluent Cranial nerve II-XII: Pupils were equal round reactive to light. Extraocular movements were full, visual field were full on confrontational test.. Head turning and shoulder shrug  were normal and symmetric. Motor: The motor testing reveals 5 over 5 strength of all 4 extremities. Good symmetric motor tone is noted throughout.  Sensory: Sensory testing is intact to soft touch on all 4 extremities. No evidence of extinction is noted.  Coordination: Cerebellar testing reveals good finger-nose-finger and heel-to-shin bilaterally.  Gait and station: Gait is slightly wide-based.   Reflexes: Deep tendon reflexes are symmetric and normal bilaterally.   DIAGNOSTIC DATA (LABS, IMAGING, TESTING) - I reviewed patient records, labs, notes, testing and imaging myself where available.  Lab Results  Component Value Date   WBC 6.6 10/23/2013   HGB 13.6 10/23/2013   HCT 41.0 10/23/2013   MCV 90.6 10/23/2013   PLT 213.0 10/23/2013      Component Value Date/Time   NA 140 10/23/2013 0959   K 4.2 10/23/2013 0959   CL 105 10/23/2013 0959   CO2 28 10/23/2013 0959   GLUCOSE 117 (H) 10/23/2013 0959   BUN 16 10/23/2013 0959   CREATININE 0.9 10/23/2013 0959   CALCIUM 9.2 10/23/2013 0959   PROT 7.1 10/23/2013 0959   ALBUMIN 4.0 10/23/2013 0959   AST 21 10/23/2013 0959   ALT 15 10/23/2013 0959   ALKPHOS 71 10/23/2013 0959   BILITOT 0.3 10/23/2013 0959   GFRNONAA >60 06/13/2009 0431    GFRAA  06/13/2009 0431    >60        The eGFR has been calculated using the MDRD equation. This calculation has not been validated in all clinical situations. eGFR's persistently <60 mL/min signify possible Chronic Kidney Disease.   Lab Results  Component Value Date   CHOL 295 (H) 10/23/2013   HDL 69.00 10/23/2013   LDLCALC 204 (H) 10/23/2013   LDLDIRECT 143.7 04/14/2011   TRIG 109.0 10/23/2013   CHOLHDL 4 10/23/2013   Lab Results  Component Value Date   HGBA1C (H) 06/10/2009    6.2 (NOTE) The ADA recommends the following therapeutic goal for glycemic control related to Hgb A1c measurement: Goal of therapy: <6.5 Hgb A1c  Reference: American Diabetes Association: Clinical Practice Recommendations 2010, Diabetes Care, 2010, 33: (Suppl  1).   Lab Results  Component Value Date   VITAMINB12 420 08/21/2010   Lab Results  Component Value Date   TSH 0.68 10/23/2013      ASSESSMENT AND PLAN 87 y.o. year old female  has a past medical history of Allergic rhinitis, Alzheimer disease ([place]) (01/15/2017), Anemia, Anxiety, Anxiety and depression, Cataract, Depression, Diverticulosis of colon, DJD (degenerative joint disease), Gastric polyps, GERD (gastroesophageal reflux disease), Hearing loss, Hiatal hernia, History of pericarditis, Hypercholesteremia, IBS (irritable bowel syndrome), Lumbar back pain, Memory loss, Monoclonal gammopathy, Osteoporosis, Presence of permanent cardiac pacemaker, and Sick sinus syndrome ([place]). here with :  1.  Dementia   MMSE stable 21/30  Continue Namenda XR 20 mg daily  Encouraged the daughter to  try to take her mom out for short outings once a week.  Continue Prozac 40 mg daily may need to consider another medication in the future if family & patient feels that depression is an issue  Advised if symptoms worsen or she develops new symptoms she should let us know  Follow-up in 6 months or sooner if needed  I spent 30 minutes of face-to-face and  non-face-to-face time with patient.  This included previsit chart review, lab review, study review, order entry, electronic health record documentation, patient education.  Ward Givens, MSN, NP-C 06/11/2020, 11:24 AM [facility] [address], [place], [place] [address] [phone]

## 2020-06-11 NOTE — Patient Instructions (Signed)
Your Plan:  Continue Namenda XR 28 mg daily If your symptoms worsen or you develop new symptoms please let us know.   Thank you for coming to see [place] at [facility]. I hope we have been able to provide you high quality care today.  You may receive a patient satisfaction survey over the next few weeks. We would appreciate your feedback and comments so that we may continue to improve ourselves and the health of our patients.

## 2020-06-11 NOTE — Progress Notes (Signed)
I have read the note, and I agree with the clinical assessment and plan.  Leona Alen K Nasha Diss   

## 2020-07-08 ENCOUNTER — Telehealth: Payer: Self-pay | Admitting: Adult Health

## 2020-07-08 NOTE — Telephone Encounter (Signed)
Pt's daughter(on DPR) is asking for new anxiety medication to be called in for pt

## 2020-07-08 NOTE — Telephone Encounter (Signed)
Spoke to daughter.  Prozac 40 mg once a day is still not cutting it in regards to patient's depression and anxiety.  Daughter feels that she needs something to help calm her down.  She will keep to the apartment which is in independent living.  She does have a nurse administer medications every morning.  With biweekly CNA that helps with ADLs.  She had been on Ativan in the past.  And is asking for something similar to help calm her anxiety.  Please advise.  Daughter states spoke about this and last appointment.  You recommended possible change in medication.

## 2020-07-09 NOTE — Telephone Encounter (Signed)
I spoke to daughter and she has tried to take her out and she refuses.  She thinks that trying something even prn would be so helpful.

## 2020-07-09 NOTE — Telephone Encounter (Signed)
Please inquire if the patient's daughter has tried taking the patient now for short outings?  My goal was to try these things first versus changing her medication.

## 2020-07-11 NOTE — Telephone Encounter (Signed)
Please advise the patient's daughter that I would rather stay away from PRN Medication as most of these medications are benzodiazepines.  The side effect profile is not favorable for older population.  Advised that buspirone is a good medication for anxiety.  We can start low-dose 5 mg daily.  We will need to reduce her dose of Prozac to 20 mg daily.  Advised that being on Prozac and BuSpar increases her risk for serotonin syndrome.  Eventually we will get her off of the Prozac.  I do not want to stop this abruptly.  Please advise the daughter to look at this medication if she is interested in trying it we can send in a prescription

## 2020-07-11 NOTE — Telephone Encounter (Signed)
Daughter called back, and I reviewed NP'S message with her. She stated she will look it up and call back next week with her decision. She verbalized understanding, appreciation.

## 2020-07-11 NOTE — Telephone Encounter (Signed)
LVM for daughter, Manuela Schwartz requesting call back.

## 2020-09-17 ENCOUNTER — Other Ambulatory Visit: Payer: Self-pay

## 2020-09-17 MED ORDER — MEMANTINE HCL ER 28 MG PO CP24: 28.0000 mg | ORAL_CAPSULE | Freq: Every day | ORAL | 5 refills | Status: AC

## 2020-12-16 ENCOUNTER — Ambulatory Visit: Payer: Medicare Other | Admitting: Adult Health

## 2021-03-27 ENCOUNTER — Ambulatory Visit: Payer: Medicare Other | Admitting: Adult Health

## 2021-07-07 ENCOUNTER — Other Ambulatory Visit: Payer: Self-pay

## 2021-07-07 ENCOUNTER — Encounter (HOSPITAL_COMMUNITY): Payer: Self-pay

## 2021-07-07 ENCOUNTER — Emergency Department (HOSPITAL_COMMUNITY): Payer: Medicare Other

## 2021-07-07 ENCOUNTER — Emergency Department (HOSPITAL_COMMUNITY)
Admission: EM | Admit: 2021-07-07 | Discharge: 2021-07-07 | Disposition: A | Discharge status: Skilled Nursing Facility | Payer: Medicare Other | Attending: Emergency Medicine | Admitting: Emergency Medicine

## 2021-07-07 DIAGNOSIS — G309 Alzheimer's disease, unspecified: Secondary | ICD-10-CM | POA: Insufficient documentation

## 2021-07-07 DIAGNOSIS — W19XXXA Unspecified fall, initial encounter: Secondary | ICD-10-CM | POA: Insufficient documentation

## 2021-07-07 DIAGNOSIS — S0083XA Contusion of other part of head, initial encounter: Secondary | ICD-10-CM | POA: Insufficient documentation

## 2021-07-07 DIAGNOSIS — F0280 Dementia in other diseases classified elsewhere without behavioral disturbance: Secondary | ICD-10-CM | POA: Insufficient documentation

## 2021-07-07 DIAGNOSIS — S0993XA Unspecified injury of face, initial encounter: Secondary | ICD-10-CM | POA: Diagnosis present

## 2021-07-07 MED ORDER — HALOPERIDOL LACTATE 5 MG/ML IJ SOLN
5.0000 mg | Freq: Once | INTRAMUSCULAR | Status: AC
  Administered 2021-07-07: 5 mg via INTRAVENOUS
  Filled 2021-07-07: qty 1

## 2021-07-07 NOTE — ED Provider Notes (Signed)
[place] DEPT Provider Note   CSN: [id number] Arrival date & time: 07/07/21  1445     History  Chief Complaint  Patient presents with   Adline Mango GABRIELLIA REMPEL is a 88 y.o. female.   Fall   88 year old female with a history of Alzheimer's dementia presenting to the emergency department as a nonlevel trauma after a mechanical fall yesterday.  The patient reportedly sustained a mechanical fall yesterday while at her facility, [facility] in [place].  She arrived to the [facility] long ED GCS 14, ABC intact with no complaints.  She appeared to be at her baseline mental status, alert and oriented x1. The patient was requesting discharge back to her living facility [facility] on arrival and had no immediate complaints of pain or injury.  Home Medications Prior to Admission medications   Medication Sig Start Date End Date Taking? Authorizing Provider  FLUoxetine (PROZAC) 40 MG capsule Take 1 capsule (40 mg total) by mouth daily. 08/07/19   Ward Givens, NP  memantine (NAMENDA XR) 28 MG CP24 24 hr capsule Take 1 capsule (28 mg total) by mouth daily. 09/17/20   Ward Givens, NP      Allergies    Aricept Reather Littler hcl] and Exelon [rivastigmine tartrate]    Review of Systems   Review of Systems  Unable to perform ROS: Dementia   Physical Exam Updated Vital Signs BP (!) 148/137    Pulse 74    Temp 98.5 F (36.9 C) (Oral)    Resp 18    Ht 5\' 4"  (1.626 m)    Wt 75.8 kg    SpO2 96%    BMI 28.68 kg/m  Physical Exam Vitals and nursing note reviewed.  Constitutional:      General: She is not in acute distress.    Appearance: She is well-developed.     Comments: GCS 15, ABC intact  HENT:     Head: Normocephalic.     Comments: Mild ecchymoses around the left orbit medially and inferiorly with minimal tenderness to palpation. Eyes:     Extraocular Movements: Extraocular movements intact.     Conjunctiva/sclera: Conjunctivae normal.      Pupils: Pupils are equal, round, and reactive to light.  Neck:     Comments: No midline tenderness to palpation of the cervical spine.  Range of motion intact Cardiovascular:     Rate and Rhythm: Normal rate and regular rhythm.     Heart sounds: No murmur heard. Pulmonary:     Effort: Pulmonary effort is normal. No respiratory distress.     Breath sounds: Normal breath sounds.  Chest:     Comments: Clavicles stable nontender to AP compression.  Chest wall stable and nontender to AP and lateral compression. Abdominal:     Palpations: Abdomen is soft.     Tenderness: There is no abdominal tenderness.  Musculoskeletal:     Cervical back: Neck supple.     Comments: No midline tenderness to palpation of the thoracic or lumbar spine.  Extremities atraumatic with intact range of motion  Skin:    General: Skin is warm and dry.  Neurological:     Mental Status: She is alert.     Comments: Cranial nerves II through XII grossly intact.  Moving all 4 extremities spontaneously.  Sensation grossly intact all 4 extremities    ED Results / Procedures / Treatments   Labs (all labs ordered are listed, but only abnormal results are displayed) Labs  Reviewed - No data to display  EKG None  Radiology CT HEAD WO CONTRAST (5MM)  Result Date: 07/07/2021 CLINICAL DATA:  Head trauma, minor (Age >= 65y).  Fall. EXAM: CT HEAD WITHOUT CONTRAST TECHNIQUE: Contiguous axial images were obtained from the base of the skull through the vertex without intravenous contrast. RADIATION DOSE REDUCTION: This exam was performed according to the departmental dose-optimization program which includes automated exposure control, adjustment of the mA and/or kV according to patient size and/or use of iterative reconstruction technique. COMPARISON:  05/24/2012 FINDINGS: Brain: There is atrophy and chronic small vessel disease changes. Associated ventriculomegaly. Chronic right thalamic lacunar infarct. No acute intracranial  abnormality. Specifically, no hemorrhage, hydrocephalus, mass lesion, acute infarction, or significant intracranial injury. Vascular: No hyperdense vessel or unexpected calcification. Skull: No acute calvarial abnormality. Sinuses/Orbits: No acute findings Other: None IMPRESSION: Atrophy, chronic microvascular disease. No acute intracranial abnormality. Old right thalamic lacunar infarct. Electronically Signed   By: Rolm Baptise M.D.   On: 07/07/2021 18:39   CT Cervical Spine Wo Contrast  Result Date: 07/07/2021 CLINICAL DATA:  Neck trauma EXAM: CT CERVICAL SPINE WITHOUT CONTRAST TECHNIQUE: Multidetector CT imaging of the cervical spine was performed without intravenous contrast. Multiplanar CT image reconstructions were also generated. RADIATION DOSE REDUCTION: This exam was performed according to the departmental dose-optimization program which includes automated exposure control, adjustment of the mA and/or kV according to patient size and/or use of iterative reconstruction technique. COMPARISON:  None. FINDINGS: Alignment: Normal. Skull base and vertebrae: No acute fracture. No primary bone lesion or focal pathologic process. Soft tissues and spinal canal: No prevertebral fluid or swelling. No visible canal hematoma. Disc levels:  Mild multilevel degenerative disc disease. Upper chest: Negative. Other: None. IMPRESSION: No evidence of acute cervical spine fracture or traumatic malalignment. Electronically Signed   By: Yetta Glassman M.D.   On: 07/07/2021 18:42   DG Pelvis Portable  Result Date: 07/07/2021 CLINICAL DATA:  Recent fall, left hip bruising EXAM: PORTABLE PELVIS 1-2 VIEWS COMPARISON:  05/11/2009 FINDINGS: Bones are osteopenic. Bony pelvis and hips appear symmetric and intact. Normal appearing SI joints for age. No acute osseous finding or displaced fracture. No malalignment or diastasis. Nonobstructive bowel gas pattern. IMPRESSION: No acute osseous finding. Electronically Signed   By: Jerilynn Mages.   Shick M.D.   On: 07/07/2021 15:43    Procedures Procedures    Medications Ordered in ED Medications  haloperidol lactate (HALDOL) injection 5 mg (5 mg Intravenous Given 07/07/21 1626)    ED Course/ Medical Decision Making/ A&P                           Medical Decision Making Amount and/or Complexity of Data Reviewed Radiology: ordered.  Risk Prescription drug management.   88 year old female with a history of Alzheimer's dementia presenting to the emergency department as a nonlevel trauma after a mechanical fall yesterday.  The patient reportedly sustained a mechanical fall yesterday while at her facility, [facility] in [place].  She arrived to the [facility] long ED GCS 14, ABC intact with no complaints.  She appeared to be at her baseline mental status, alert and oriented x1. The patient was requesting discharge back to her living facility [facility] on arrival and had no immediate complaints of pain or injury.  The patient had XR imaging of her pelvis which revealed no acute fracture or deformity of the pelvis or hips. She had periorbital echymosis on the left but minimal tenderness.  She appears to be at her baseline mental status.   I attempted to speak to facility staff at [facility] senior living but was unable to reach a staff nurse. I attempted to call the patient's daughter for additional information.  CT imaging of the head and cervical spine was performed. The patient did require IV Haldol prior to imaging due to an episode of agitation.   CT of the head and cervical spine was without acute intracranial abnormality, no fracture or malalignment of the cervical spine, no skull fracture.  The patient appears to be at her baseline mental status with no other signs of traumatic injury on primary or secondary survey.  Able for discharge back to her facility.  Final Clinical Impression(s) / ED Diagnoses Final diagnoses:  Fall, initial encounter  Contusion of face,  initial encounter    Rx / DC Orders ED Discharge Orders     None         Regan Lemming, MD 07/07/21 1916

## 2021-07-07 NOTE — ED Notes (Signed)
Pt daughter susan updated via telephone per request

## 2021-07-07 NOTE — ED Triage Notes (Signed)
Pt BIB EMS for a fall yesterday. Pt has complaints of neck pain and bruises around her left eye that has gotten worse. She also has bruising on her left hip.  97.0 F 150/80 bp 80 hr 95% room air 24 rr

## 2021-07-07 NOTE — Discharge Instructions (Signed)
You were evaluated in the Emergency Department and after careful evaluation, we did not find any emergent condition requiring admission or further testing in the hospital.  Your exam/testing today was overall reassuring.  Your CT imaging did not reveal evidence of acute intracranial abnormality, fracture or malalignment of the cervical spine or skull.  Your x-rays of the pelvis were without traumatic injury.  Please return to the Emergency Department if you experience any worsening of your condition.  Thank you for allowing [place] to be a part of your care.

## 2021-07-07 NOTE — ED Notes (Signed)
[facility] CALLED  °

## 2021-07-07 NOTE — ED Notes (Signed)
ED Provider at bedside. 

## 2021-07-07 NOTE — ED Notes (Signed)
Attempted to call [employer] x 2, no answer, left HIPPA compliant message

## 2021-07-07 NOTE — ED Notes (Signed)
Pt appears anxious in bed, with contusions around left orbit. A/ox2, baseline per facility notes. Pt states she fell yesterday but she does not know why she came in today. Per off-going nurse, EMS states witnessed mechanical fall yesterday. - blood thinners. Pt denies head/neck/back pain. Pelvis stable, equality noted throughout. +pmsc.

## 2021-07-08 NOTE — ED Notes (Signed)
[facility] at bedside to pick pt up. Pt moved to gurney, all paperwork given to [facility] crew.

## 2022-05-29 DIAGNOSIS — Z6830 Body mass index (BMI) 30.0-30.9, adult: Secondary | ICD-10-CM

## 2022-05-29 DIAGNOSIS — F0393 Unspecified dementia, unspecified severity, with mood disturbance: Secondary | ICD-10-CM

## 2022-05-29 DIAGNOSIS — M6281 Muscle weakness (generalized): Secondary | ICD-10-CM | POA: Diagnosis not present

## 2022-05-29 DIAGNOSIS — E559 Vitamin D deficiency, unspecified: Secondary | ICD-10-CM | POA: Diagnosis not present

## 2022-05-29 DIAGNOSIS — R2689 Other abnormalities of gait and mobility: Secondary | ICD-10-CM

## 2022-05-29 DIAGNOSIS — R52 Pain, unspecified: Secondary | ICD-10-CM

## 2022-06-29 DIAGNOSIS — F0394 Unspecified dementia, unspecified severity, with anxiety: Secondary | ICD-10-CM | POA: Diagnosis not present

## 2023-02-04 DIAGNOSIS — E559 Vitamin D deficiency, unspecified: Secondary | ICD-10-CM | POA: Diagnosis not present

## 2023-02-04 DIAGNOSIS — G301 Alzheimer's disease with late onset: Secondary | ICD-10-CM | POA: Diagnosis not present

## 2023-02-04 DIAGNOSIS — F330 Major depressive disorder, recurrent, mild: Secondary | ICD-10-CM | POA: Diagnosis not present

## 2023-11-12 DIAGNOSIS — G301 Alzheimer's disease with late onset: Secondary | ICD-10-CM | POA: Diagnosis not present

## 2023-11-12 DIAGNOSIS — F330 Major depressive disorder, recurrent, mild: Secondary | ICD-10-CM | POA: Diagnosis not present

## 2023-11-12 DIAGNOSIS — R2689 Other abnormalities of gait and mobility: Secondary | ICD-10-CM | POA: Diagnosis not present

## 2024-01-07 DIAGNOSIS — F339 Major depressive disorder, recurrent, unspecified: Secondary | ICD-10-CM | POA: Diagnosis not present

## 2024-01-07 DIAGNOSIS — F99 Mental disorder, not otherwise specified: Secondary | ICD-10-CM | POA: Diagnosis not present

## 2024-02-20 DIAGNOSIS — J309 Allergic rhinitis, unspecified: Secondary | ICD-10-CM | POA: Diagnosis not present

## 2024-02-20 DIAGNOSIS — F0390 Unspecified dementia without behavioral disturbance: Secondary | ICD-10-CM | POA: Diagnosis not present

## 2024-02-20 DIAGNOSIS — F339 Major depressive disorder, recurrent, unspecified: Secondary | ICD-10-CM | POA: Diagnosis not present

## 2024-02-20 DIAGNOSIS — F419 Anxiety disorder, unspecified: Secondary | ICD-10-CM | POA: Diagnosis not present

## 2024-03-21 DIAGNOSIS — M6281 Muscle weakness (generalized): Secondary | ICD-10-CM | POA: Diagnosis not present

## 2024-03-21 DIAGNOSIS — G309 Alzheimer's disease, unspecified: Secondary | ICD-10-CM | POA: Diagnosis not present

## 2024-03-21 DIAGNOSIS — F419 Anxiety disorder, unspecified: Secondary | ICD-10-CM | POA: Diagnosis not present

## 2024-03-21 DIAGNOSIS — J309 Allergic rhinitis, unspecified: Secondary | ICD-10-CM | POA: Diagnosis not present

## 2024-03-21 DIAGNOSIS — F331 Major depressive disorder, recurrent, moderate: Secondary | ICD-10-CM | POA: Diagnosis not present
